# Patient Record
Sex: Male | Born: 1940 | Race: White | Hispanic: No | Smoking: Former smoker
Health system: Southern US, Community
[De-identification: ages and names within clinical notes are randomized; demographics above are authoritative.]

## PROBLEM LIST (undated history)

## (undated) DIAGNOSIS — N419 Inflammatory disease of prostate, unspecified: Secondary | ICD-10-CM

## (undated) DIAGNOSIS — E079 Disorder of thyroid, unspecified: Secondary | ICD-10-CM

## (undated) DIAGNOSIS — R7303 Prediabetes: Secondary | ICD-10-CM

## (undated) DIAGNOSIS — E291 Testicular hypofunction: Secondary | ICD-10-CM

## (undated) DIAGNOSIS — I10 Essential (primary) hypertension: Secondary | ICD-10-CM

## (undated) DIAGNOSIS — E785 Hyperlipidemia, unspecified: Secondary | ICD-10-CM

## (undated) DIAGNOSIS — N400 Enlarged prostate without lower urinary tract symptoms: Secondary | ICD-10-CM

## (undated) HISTORY — DX: Prediabetes: R73.03

## (undated) HISTORY — DX: Hyperlipidemia, unspecified: E78.5

## (undated) HISTORY — DX: Testicular hypofunction: E29.1

## (undated) HISTORY — DX: Disorder of thyroid, unspecified: E07.9

## (undated) HISTORY — DX: Essential (primary) hypertension: I10

## (undated) HISTORY — DX: Inflammatory disease of prostate, unspecified: N41.9

## (undated) HISTORY — DX: Benign prostatic hyperplasia without lower urinary tract symptoms: N40.0

## (undated) HISTORY — PX: APPENDECTOMY: SHX54

---

## 2008-04-09 ENCOUNTER — Ambulatory Visit: Payer: Self-pay | Admitting: Gastroenterology

## 2008-04-14 LAB — HM COLONOSCOPY

## 2008-04-24 ENCOUNTER — Ambulatory Visit: Payer: Self-pay | Admitting: Gastroenterology

## 2008-04-24 ENCOUNTER — Encounter: Payer: Self-pay | Admitting: Gastroenterology

## 2008-04-29 ENCOUNTER — Encounter: Payer: Self-pay | Admitting: Gastroenterology

## 2010-02-16 ENCOUNTER — Ambulatory Visit (HOSPITAL_COMMUNITY)
Admission: RE | Admit: 2010-02-16 | Discharge: 2010-02-16 | Payer: Self-pay | Source: Home / Self Care | Attending: Internal Medicine | Admitting: Internal Medicine

## 2011-02-02 ENCOUNTER — Ambulatory Visit (HOSPITAL_COMMUNITY)
Admission: RE | Admit: 2011-02-02 | Discharge: 2011-02-02 | Disposition: A | Discharge status: Home / Self Care | Payer: Medicare Other | Source: Ambulatory Visit | Attending: Internal Medicine | Admitting: Internal Medicine

## 2011-02-02 ENCOUNTER — Other Ambulatory Visit (HOSPITAL_COMMUNITY): Payer: Self-pay | Admitting: Internal Medicine

## 2011-02-02 DIAGNOSIS — I10 Essential (primary) hypertension: Secondary | ICD-10-CM

## 2011-02-02 DIAGNOSIS — Z0000 Encounter for general adult medical examination without abnormal findings: Secondary | ICD-10-CM | POA: Insufficient documentation

## 2011-08-01 ENCOUNTER — Other Ambulatory Visit (HOSPITAL_COMMUNITY): Payer: Self-pay | Admitting: Internal Medicine

## 2011-08-01 ENCOUNTER — Ambulatory Visit (HOSPITAL_COMMUNITY)
Admission: RE | Admit: 2011-08-01 | Discharge: 2011-08-01 | Disposition: A | Discharge status: Home / Self Care | Payer: Medicare Other | Source: Ambulatory Visit | Attending: Internal Medicine | Admitting: Internal Medicine

## 2011-08-01 DIAGNOSIS — M67919 Unspecified disorder of synovium and tendon, unspecified shoulder: Secondary | ICD-10-CM | POA: Insufficient documentation

## 2011-08-01 DIAGNOSIS — M25519 Pain in unspecified shoulder: Secondary | ICD-10-CM

## 2011-08-01 DIAGNOSIS — M719 Bursopathy, unspecified: Secondary | ICD-10-CM | POA: Insufficient documentation

## 2013-01-31 ENCOUNTER — Other Ambulatory Visit: Payer: Self-pay | Admitting: Internal Medicine

## 2013-02-15 ENCOUNTER — Encounter: Payer: Self-pay | Admitting: Internal Medicine

## 2013-02-18 ENCOUNTER — Ambulatory Visit (INDEPENDENT_AMBULATORY_CARE_PROVIDER_SITE_OTHER): Payer: Medicare Other | Admitting: Internal Medicine

## 2013-02-18 ENCOUNTER — Encounter: Payer: Self-pay | Admitting: Internal Medicine

## 2013-02-18 VITALS — BP 126/80 | HR 72 | Temp 99.0°F | Resp 16 | Wt 179.2 lb

## 2013-02-18 DIAGNOSIS — Z79899 Other long term (current) drug therapy: Secondary | ICD-10-CM

## 2013-02-18 DIAGNOSIS — R7309 Other abnormal glucose: Secondary | ICD-10-CM

## 2013-02-18 DIAGNOSIS — M797 Fibromyalgia: Secondary | ICD-10-CM

## 2013-02-18 DIAGNOSIS — E039 Hypothyroidism, unspecified: Secondary | ICD-10-CM

## 2013-02-18 DIAGNOSIS — E782 Mixed hyperlipidemia: Secondary | ICD-10-CM

## 2013-02-18 DIAGNOSIS — E559 Vitamin D deficiency, unspecified: Secondary | ICD-10-CM

## 2013-02-18 DIAGNOSIS — I10 Essential (primary) hypertension: Secondary | ICD-10-CM | POA: Insufficient documentation

## 2013-02-18 DIAGNOSIS — R7303 Prediabetes: Secondary | ICD-10-CM | POA: Insufficient documentation

## 2013-02-18 DIAGNOSIS — M790 Rheumatism, unspecified: Secondary | ICD-10-CM | POA: Insufficient documentation

## 2013-02-18 LAB — CBC WITH DIFFERENTIAL/PLATELET
BASOS ABS: 0 10*3/uL (ref 0.0–0.1)
Basophils Relative: 0 % (ref 0–1)
Eosinophils Absolute: 0.2 10*3/uL (ref 0.0–0.7)
Eosinophils Relative: 4 % (ref 0–5)
HEMATOCRIT: 42.3 % (ref 39.0–52.0)
HEMOGLOBIN: 14.5 g/dL (ref 13.0–17.0)
LYMPHS ABS: 1.4 10*3/uL (ref 0.7–4.0)
LYMPHS PCT: 25 % (ref 12–46)
MCH: 31.8 pg (ref 26.0–34.0)
MCHC: 34.3 g/dL (ref 30.0–36.0)
MCV: 92.8 fL (ref 78.0–100.0)
MONO ABS: 0.8 10*3/uL (ref 0.1–1.0)
MONOS PCT: 14 % — AB (ref 3–12)
NEUTROS ABS: 3.2 10*3/uL (ref 1.7–7.7)
Neutrophils Relative %: 57 % (ref 43–77)
Platelets: 221 10*3/uL (ref 150–400)
RBC: 4.56 MIL/uL (ref 4.22–5.81)
RDW: 13.7 % (ref 11.5–15.5)
WBC: 5.6 10*3/uL (ref 4.0–10.5)

## 2013-02-18 LAB — BASIC METABOLIC PANEL WITH GFR
BUN: 19 mg/dL (ref 6–23)
CO2: 26 mEq/L (ref 19–32)
Calcium: 9.8 mg/dL (ref 8.4–10.5)
Chloride: 101 mEq/L (ref 96–112)
Creat: 0.98 mg/dL (ref 0.50–1.35)
GFR, Est Non African American: 78 mL/min
GLUCOSE: 102 mg/dL — AB (ref 70–99)
POTASSIUM: 4.3 meq/L (ref 3.5–5.3)
Sodium: 138 mEq/L (ref 135–145)

## 2013-02-18 LAB — HEPATIC FUNCTION PANEL
ALT: 21 U/L (ref 0–53)
AST: 32 U/L (ref 0–37)
Albumin: 4.2 g/dL (ref 3.5–5.2)
Alkaline Phosphatase: 69 U/L (ref 39–117)
BILIRUBIN DIRECT: 0.1 mg/dL (ref 0.0–0.3)
BILIRUBIN INDIRECT: 0.4 mg/dL (ref 0.0–0.9)
Total Bilirubin: 0.5 mg/dL (ref 0.3–1.2)
Total Protein: 6.5 g/dL (ref 6.0–8.3)

## 2013-02-18 LAB — LIPID PANEL
Cholesterol: 198 mg/dL (ref 0–200)
HDL: 67 mg/dL (ref >39)
LDL CALC: 96 mg/dL (ref 0–99)
TRIGLYCERIDES: 177 mg/dL — AB (ref <150)
Total CHOL/HDL Ratio: 3 Ratio
VLDL: 35 mg/dL (ref 0–40)

## 2013-02-18 LAB — MAGNESIUM: Magnesium: 2 mg/dL (ref 1.5–2.5)

## 2013-02-18 NOTE — Patient Instructions (Signed)
Hypertension As your heart beats, it forces blood through your arteries. This force is your blood pressure. If the pressure is too high, it is called hypertension (HTN) or high blood pressure. HTN is dangerous because you may have it and not know it. High blood pressure may mean that your heart has to work harder to pump blood. Your arteries may be narrow or stiff. The extra work puts you at risk for heart disease, stroke, and other problems.  Blood pressure consists of two numbers, a higher number over a lower, 110/72, for example. It is stated as "110 over 72." The ideal is below 120 for the top number (systolic) and under 80 for the bottom (diastolic). Write down your blood pressure today. You should pay close attention to your blood pressure if you have certain conditions such as:  Heart failure.  Prior heart attack.  Diabetes  Chronic kidney disease.  Prior stroke.  Multiple risk factors for heart disease. To see if you have HTN, your blood pressure should be measured while you are seated with your arm held at the level of the heart. It should be measured at least twice. A one-time elevated blood pressure reading (especially in the Emergency Department) does not mean that you need treatment. There may be conditions in which the blood pressure is different between your right and left arms. It is important to see your caregiver soon for a recheck. Most people have essential hypertension which means that there is not a specific cause. This type of high blood pressure may be lowered by changing lifestyle factors such as:  Stress.  Smoking.  Lack of exercise.  Excessive weight.  Drug/tobacco/alcohol use.  Eating less salt. Most people do not have symptoms from high blood pressure until it has caused damage to the body. Effective treatment can often prevent, delay or reduce that damage. TREATMENT  When a cause has been identified, treatment for high blood pressure is directed at the  cause. There are a large number of medications to treat HTN. These fall into several categories, and your caregiver will help you select the medicines that are best for you. Medications may have side effects. You should review side effects with your caregiver. If your blood pressure stays high after you have made lifestyle changes or started on medicines,   Your medication(s) may need to be changed.  Other problems may need to be addressed.  Be certain you understand your prescriptions, and know how and when to take your medicine.  Be sure to follow up with your caregiver within the time frame advised (usually within two weeks) to have your blood pressure rechecked and to review your medications.  If you are taking more than one medicine to lower your blood pressure, make sure you know how and at what times they should be taken. Taking two medicines at the same time can result in blood pressure that is too low. SEEK IMMEDIATE MEDICAL CARE IF:  You develop a severe headache, blurred or changing vision, or confusion.  You have unusual weakness or numbness, or a faint feeling.  You have severe chest or abdominal pain, vomiting, or breathing problems. MAKE SURE YOU:   Understand these instructions.  Will watch your condition.  Will get help right away if you are not doing well or get worse. Document Released: 01/31/2005 Document Revised: 04/25/2011 Document Reviewed: 09/21/2007 [employer] Patient Information 2014 [employer].  Diabetes and Exercise Exercising regularly is important. It is not just about losing weight. It   has many health benefits, such as:  Improving your overall fitness, flexibility, and endurance.  Increasing your bone density.  Helping with weight control.  Decreasing your body fat.  Increasing your muscle strength.  Reducing stress and tension.  Improving your overall health. People with diabetes who exercise gain additional benefits because  exercise:  Reduces appetite.  Improves the body's use of blood sugar (glucose).  Helps lower or control blood glucose.  Decreases blood pressure.  Helps control blood lipids (such as cholesterol and triglycerides).  Improves the body's use of the hormone insulin by:  Increasing the body's insulin sensitivity.  Reducing the body's insulin needs.  Decreases the risk for heart disease because exercising:  Lowers cholesterol and triglycerides levels.  Increases the levels of good cholesterol (such as high-density lipoproteins [HDL]) in the body.  Lowers blood glucose levels. YOUR ACTIVITY PLAN  Choose an activity that you enjoy and set realistic goals. Your health care provider or diabetes educator can help you make an activity plan that works for you. You can break activities into 2 or 3 sessions throughout the day. Doing so is as good as one long session. Exercise ideas include:  Taking the dog for a walk.  Taking the stairs instead of the elevator.  Dancing to your favorite song.  Doing your favorite exercise with a friend. RECOMMENDATIONS FOR EXERCISING WITH TYPE 1 OR TYPE 2 DIABETES   Check your blood glucose before exercising. If blood glucose levels are greater than 240 mg/dL, check for urine ketones. Do not exercise if ketones are present.  Avoid injecting insulin into areas of the body that are going to be exercised. For example, avoid injecting insulin into:  The arms when playing tennis.  The legs when jogging.  Keep a record of:  Food intake before and after you exercise.  Expected peak times of insulin action.  Blood glucose levels before and after you exercise.  The type and amount of exercise you have done.  Review your records with your health care provider. Your health care provider will help you to develop guidelines for adjusting food intake and insulin amounts before and after exercising.  If you take insulin or oral hypoglycemic agents, watch  for signs and symptoms of hypoglycemia. They include:  Dizziness.  Shaking.  Sweating.  Chills.  Confusion.  Drink plenty of water while you exercise to prevent dehydration or heat stroke. Body water is lost during exercise and must be replaced.  Talk to your health care provider before starting an exercise program to make sure it is safe for you. Remember, almost any type of activity is better than none. Document Released: 04/23/2003 Document Revised: 10/03/2012 Document Reviewed: 07/10/2012 [employer] Patient Information 2014 [employer].  Cholesterol Cholesterol is a white, waxy, fat-like protein needed by your body in small amounts. The liver makes all the cholesterol you need. It is carried from the liver by the blood through the blood vessels. Deposits (plaque) may build up on blood vessel walls. This makes the arteries narrower and stiffer. Plaque increases the risk for heart attack and stroke. You cannot feel your cholesterol level even if it is very high. The only way to know is by a blood test to check your lipid (fats) levels. Once you know your cholesterol levels, you should keep a record of the test results. Work with your caregiver to to keep your levels in the desired range. WHAT THE RESULTS MEAN:  Total cholesterol is a rough measure of all the cholesterol   in your blood.  LDL is the so-called bad cholesterol. This is the type that deposits cholesterol in the walls of the arteries. You want this level to be low.  HDL is the good cholesterol because it cleans the arteries and carries the LDL away. You want this level to be high.  Triglycerides are fat that the body can either burn for energy or store. High levels are closely linked to heart disease. DESIRED LEVELS:  Total cholesterol below 200.  LDL below 100 for people at risk, below 70 for very high risk.  HDL above 50 is good, above 60 is best.  Triglycerides below 150. HOW TO LOWER YOUR  CHOLESTEROL:  Diet.  Choose fish or white meat chicken and [place], roasted or baked. Limit fatty cuts of red meat, fried foods, and processed meats, such as sausage and lunch meat.  Eat lots of fresh fruits and vegetables. Choose whole grains, beans, pasta, potatoes and cereals.  Use only small amounts of olive, corn or canola oils. Avoid butter, mayonnaise, shortening or palm kernel oils. Avoid foods with trans-fats.  Use skim/nonfat milk and low-fat/nonfat yogurt and cheeses. Avoid whole milk, cream, ice cream, egg yolks and cheeses. Healthy desserts include angel food cake, ginger snaps, animal crackers, hard candy, popsicles, and low-fat/nonfat frozen yogurt. Avoid pastries, cakes, pies and cookies.  Exercise.  A regular program helps decrease LDL and raises HDL.  Helps with weight control.  Do things that increase your activity level like gardening, walking, or taking the stairs.  Medication.  May be prescribed by your caregiver to help lowering cholesterol and the risk for heart disease.  You may need medicine even if your levels are normal if you have several risk factors. HOME CARE INSTRUCTIONS   Follow your diet and exercise programs as suggested by your caregiver.  Take medications as directed.  Have blood work done when your caregiver feels it is necessary. MAKE SURE YOU:   Understand these instructions.  Will watch your condition.  Will get help right away if you are not doing well or get worse. Document Released: 10/26/2000 Document Revised: 04/25/2011 Document Reviewed: 04/18/2007 [facility] Patient Information 2014 [place], [place].  Vitamin D Deficiency Vitamin D is an important vitamin that your body needs. Having too little of it in your body is called a deficiency. A very bad deficiency can make your bones soft and can cause a condition called rickets.  Vitamin D is important to your body for different reasons, such as:   It helps your body absorb 2  minerals called calcium and phosphorus.  It helps make your bones healthy.  It may prevent some diseases, such as diabetes and multiple sclerosis.  It helps your muscles and heart. You can get vitamin D in several ways. It is a natural part of some foods. The vitamin is also added to some dairy products and cereals. Some people take vitamin D supplements. Also, your body makes vitamin D when you are in the sun. It changes the sun's rays into a form of the vitamin that your body can use. CAUSES   Not eating enough foods that contain vitamin D.  Not getting enough sunlight.  Having certain digestive system diseases that make it hard to absorb vitamin D. These diseases include Crohn's disease, chronic pancreatitis, and cystic fibrosis.  Having a surgery in which part of the stomach or small intestine is removed.  Being obese. Fat cells pull vitamin D out of your blood. That means that obese people  may not have enough vitamin D left in their blood and in other body tissues.  Having chronic kidney or liver disease. RISK FACTORS Risk factors are things that make you more likely to develop a vitamin D deficiency. They include:  Being older.  Not being able to get outside very much.  Living in a nursing home.  Having had broken bones.  Having weak or thin bones (osteoporosis).  Having a disease or condition that changes how your body absorbs vitamin D.  Having dark skin.  Some medicines such as seizure medicines or steroids.  Being overweight or obese. SYMPTOMS Mild cases of vitamin D deficiency may not have any symptoms. If you have a very bad case, symptoms may include:  Bone pain.  Muscle pain.  Falling often.  Broken bones caused by a minor injury, due to osteoporosis. DIAGNOSIS A blood test is the best way to tell if you have a vitamin D deficiency. TREATMENT Vitamin D deficiency can be treated in different ways. Treatment for vitamin D deficiency depends on what is  causing it. Options include:  Taking vitamin D supplements.  Taking a calcium supplement. Your caregiver will suggest what dose is best for you. HOME CARE INSTRUCTIONS  Take any supplements that your caregiver prescribes. Follow the directions carefully. Take only the suggested amount.  Have your blood tested 2 months after you start taking supplements.  Eat foods that contain vitamin D. Healthy choices include:  Fortified dairy products, cereals, or juices. Fortified means vitamin D has been added to the food. Check the label on the package to be sure.  Fatty fish like salmon or trout.  Eggs.  Oysters.  Do not use a tanning bed.  Keep your weight at a healthy level. Lose weight if you need to.  Keep all follow-up appointments. Your caregiver will need to perform blood tests to make sure your vitamin D deficiency is going away. SEEK MEDICAL CARE IF:  You have any questions about your treatment.  You continue to have symptoms of vitamin D deficiency.  You have nausea or vomiting.  You are constipated.  You feel confused.  You have severe abdominal or back pain. MAKE SURE YOU:  Understand these instructions.  Will watch your condition.  Will get help right away if you are not doing well or get worse. Document Released: 04/25/2011 Document Revised: 05/28/2012 Document Reviewed: 04/25/2011 [facility] Patient Information 2014 [place].  Fibromyalgia Fibromyalgia is a disorder that is often misunderstood. It is associated with muscular pains and tenderness that comes and goes. It is often associated with fatigue and sleep disturbances. Though it tends to be long-lasting, fibromyalgia is not life-threatening. CAUSES  The exact cause of fibromyalgia is unknown. People with certain gene types are predisposed to developing fibromyalgia and other conditions. Certain factors can play a role as triggers, such as:  Spine disorders.  Arthritis.  Severe injury (trauma)  and other physical stressors.  Emotional stressors. SYMPTOMS   The main symptom is pain and stiffness in the muscles and joints, which can vary over time.  Sleep and fatigue problems. Other related symptoms may include:  Bowel and bladder problems.  Headaches.  Visual problems.  Problems with odors and noises.  Depression or mood changes.  Painful periods (dysmenorrhea).  Dryness of the skin or eyes. DIAGNOSIS  There are no specific tests for diagnosing fibromyalgia. Patients can be diagnosed accurately from the specific symptoms they have. The diagnosis is made by determining that nothing else is causing the  problems. TREATMENT  There is no cure. Management includes medicines and an active, healthy lifestyle. The goal is to enhance physical fitness, decrease pain, and improve sleep. HOME CARE INSTRUCTIONS   Only take over-the-counter or prescription medicines as directed by your caregiver. Sleeping pills, tranquilizers, and pain medicines may make your problems worse.  Low-impact aerobic exercise is very important and advised for treatment. At first, it may seem to make pain worse. Gradually increasing your tolerance will overcome this feeling.  Learning relaxation techniques and how to control stress will help you. Biofeedback, visual imagery, hypnosis, muscle relaxation, yoga, and meditation are all options.  Anti-inflammatory medicines and physical therapy may provide short-term help.  Acupuncture or massage treatments may help.  Take muscle relaxant medicines as suggested by your caregiver.  Avoid stressful situations.  Plan a healthy lifestyle. This includes your diet, sleep, rest, exercise, and friends.  Find and practice a hobby you enjoy.  Join a fibromyalgia support group for interaction, ideas, and sharing advice. This may be helpful. SEEK MEDICAL CARE IF:  You are not having good results or improvement from your treatment. FOR MORE INFORMATION  National  Fibromyalgia Association: www.fmaware.[place]: www.arthritis.org Document Released: 01/31/2005 Document Revised: 04/25/2011 Document Reviewed: 05/13/2009 [facility] Patient Information 2014 [place], [place].

## 2013-02-18 NOTE — Progress Notes (Signed)
Patient ID: Gregory Galvan, male   DOB: [birth year], 73 y.o.   MRN: [id number]   This very nice 73 y.o. SWM presents for 3 month follow up with Hypertension, Hyperlipidemia, Hypothyroidism, Fibromyalgia, Pre-Diabetes and Vitamin D Deficiency.    HTN predates since 1989. BP has been controlled at home. Today's BP: 126/80 mmHg . Patient denies any cardiac type chest pain, palpitations, dyspnea/orthopnea/PND, dizziness, claudication, or dependent edema.   Hyperlipidemia is controlled with diet & meds. Last Cholesterol was  207, Triglycerides were 177, HDL 62 - at goal and LDL 110 - near goal. Patient denies myalgias or other med SE's.    Also, the patient has history of PreDiabetes with A1c 5.5% in Jan 2014 with last A1c of 5.6% in June 2014. Patient denies any symptoms of reactive hypoglycemia, diabetic polys, paresthesias or visual blurring.   Further, Patient has history of Vitamin D Deficiency of 24 in 2008 with last vitamin D of 90 in June this year. Patient supplements vitamin D without any suspected side-effects.  Current Outpatient Prescriptions on File Prior to Visit  Medication Sig Dispense Refill  . Cholecalciferol (VITAMIN D-3) 1000 UNITS CAPS Take 8,000 Units by mouth daily.      [name] levothyroxine (SYNTHROID, LEVOTHROID) 175 MCG tablet Take 175 mcg by mouth daily before breakfast. Takes 1 tab on tues, thur sat and sun and 1/2 tab on mon, wed, and fri      . lisinopril-hydrochlorothiazide (PRINZIDE,ZESTORETIC) 20-25 MG per tablet take 1 tablet once daily  90 tablet  0  . PARoxetine (PAXIL) 10 MG tablet Take 10 mg by mouth daily.       No current facility-administered medications on file prior to visit.     Allergies  Allergen Reactions  . Penicillins     REACTION: rash,swelling  . 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 Sulfonamide Derivatives     REACTION: itching,swelling    PMHx:   Past Medical History  Diagnosis Date  . Hypertension    . Hyperlipidemia   . Thyroid disease   . Hypogonadism, male   . Prostatitis   . BPH (benign prostatic hyperplasia)   . Pre-diabetes     FHx:    Reviewed / unchanged  SHx:    Reviewed / unchanged  Systems Review: Constitutional: Denies fever, chills, wt changes, headaches, insomnia, fatigue, night sweats, change in appetite. Eyes: Denies redness, blurred vision, diplopia, discharge, itchy, watery eyes.  ENT: Denies discharge, congestion, post nasal drip, epistaxis, sore throat, earache, hearing loss, dental pain, tinnitus, vertigo, sinus pain, snoring.  CV: Denies chest pain, palpitations, irregular heartbeat, syncope, dyspnea, diaphoresis, orthopnea, PND, claudication, edema. Respiratory: denies cough, dyspnea, DOE, pleurisy, hoarseness, laryngitis, wheezing.  Gastrointestinal: Denies dysphagia, odynophagia, heartburn, reflux, water brash, abdominal pain or cramps, nausea, vomiting, bloating, diarrhea, constipation, hematemesis, melena, hematochezia,  or hemorrhoids. Genitourinary: Denies dysuria, frequency, urgency, nocturia, hesitancy, discharge, hematuria, flank pain. Musculoskeletal: Denies arthralgias, stiffness, jt. swelling, pain, limp, strain/sprain. Does report some myalgias & am stiffness controlled on his Paxil.. Skin: Denies pruritus, rash, hives, warts, acne, eczema, change in skin lesion(s). Neuro: No weakness, tremor, incoordination, spasms, paresthesia, or pain. Psychiatric: Denies confusion, memory loss, or sensory loss. Endo: Denies change in weight, skin, hair change.  Heme/Lymph: No excessive bleeding, bruising, orenlarged lymph nodes.  BP: 126/80  Pulse: 72  Temp: 99 F (37.2 C)  Resp: 16      On Exam: Appears well nourished - in no distress. Eyes: PERRLA,  EOMs, conjunctiva no swelling or erythema. Sinuses: No frontal/maxillary tenderness ENT/Mouth: EAC's clear, TM's nl w/o erythema, bulging. Nares clear w/o erythema, swelling, exudates. Oropharynx  clear without erythema or exudates. Oral hygiene is good. Tongue normal, non obstructing. Hearing intact.  Neck: Supple. Thyroid nl. Car 2+/2+ without bruits, nodes or JVD. Chest: Respirations nl with BS clear & equal w/o rales, rhonchi, wheezing or stridor.  Cor: Heart sounds normal w/ regular rate and rhythm without sig. murmurs, gallops, clicks, or rubs. Peripheral pulses normal and equal  without edema.  Abdomen: Soft & bowel sounds normal. Non-tender w/o guarding, rebound, hernias, masses, or organomegaly.  Lymphatics: Unremarkable.  Musculoskeletal: Full ROM all peripheral extremities, joint stability, 5/5 strength, and normal gait.  Skin: Warm, dry without exposed rashes, lesions, ecchymosis apparent.  Neuro: Cranial nerves intact, reflexes equal bilaterally. Sensory-motor testing grossly intact. Tendon reflexes grossly intact.  Pysch: Alert & oriented x 3. Insight and judgement nl & appropriate. No ideations.  Assessment and Plan:  1. Hypertension - Continue monitor blood pressure at home. Continue diet/meds same.  2. Hyperlipidemia - Continue diet/meds, exercise,& lifestyle modifications. Continue monitor periodic cholesterol/liver & renal functions   3. Pre-diabetes - Continue diet, exercise, lifestyle modifications. Monitor appropriate labs.  4. Vitamin D Deficiency - Continue supplementation.  5. Fibromyalgia  6. Hypothyroidism  Recommended regular exercise, BP monitoring, weight control, and discussed med and SE's. Recommended labs to assess and monitor clinical status. Further disposition pending results of labs.

## 2013-02-19 ENCOUNTER — Telehealth: Payer: Self-pay | Admitting: *Deleted

## 2013-02-19 LAB — VITAMIN D 25 HYDROXY (VIT D DEFICIENCY, FRACTURES): Vit D, 25-Hydroxy: 90 ng/mL — ABNORMAL HIGH (ref 30–89)

## 2013-02-19 LAB — HEMOGLOBIN A1C
Hgb A1c MFr Bld: 6 % — ABNORMAL HIGH (ref <5.7)
MEAN PLASMA GLUCOSE: 126 mg/dL — AB (ref <117)

## 2013-02-19 LAB — INSULIN, FASTING: Insulin fasting, serum: 7 u[IU]/mL (ref 3–28)

## 2013-02-19 LAB — TSH: TSH: 4.173 u[IU]/mL (ref 0.350–4.500)

## 2013-02-19 NOTE — Telephone Encounter (Signed)
Message copied by Emelda Brothers on Tue Feb 19, 2013 10:15 AM ------      Message from: Unk Pinto      Created: Tue Feb 19, 2013  8:54 AM       Chol 198 great       A1c was 5.4% - now 6.0% higher in early or prediabetic range - avoid sweets/ candy and white stuff      Vit D 90 - great  - all else nl and ok       ------

## 2013-03-15 ENCOUNTER — Encounter: Payer: Self-pay | Admitting: Gastroenterology

## 2013-05-20 ENCOUNTER — Ambulatory Visit: Payer: Self-pay | Admitting: Emergency Medicine

## 2013-05-21 ENCOUNTER — Ambulatory Visit (INDEPENDENT_AMBULATORY_CARE_PROVIDER_SITE_OTHER): Payer: Medicare Other | Admitting: Emergency Medicine

## 2013-05-21 ENCOUNTER — Encounter: Payer: Self-pay | Admitting: Emergency Medicine

## 2013-05-21 VITALS — BP 132/90 | HR 66 | Temp 98.2°F | Resp 16 | Ht 69.0 in | Wt 178.0 lb

## 2013-05-21 DIAGNOSIS — R7309 Other abnormal glucose: Secondary | ICD-10-CM

## 2013-05-21 DIAGNOSIS — E782 Mixed hyperlipidemia: Secondary | ICD-10-CM

## 2013-05-21 DIAGNOSIS — R238 Other skin changes: Secondary | ICD-10-CM

## 2013-05-21 DIAGNOSIS — R239 Unspecified skin changes: Secondary | ICD-10-CM

## 2013-05-21 DIAGNOSIS — J309 Allergic rhinitis, unspecified: Secondary | ICD-10-CM

## 2013-05-21 DIAGNOSIS — I10 Essential (primary) hypertension: Secondary | ICD-10-CM

## 2013-05-21 LAB — CBC WITH DIFFERENTIAL/PLATELET
BASOS ABS: 0 10*3/uL (ref 0.0–0.1)
BASOS PCT: 0 % (ref 0–1)
EOS PCT: 3 % (ref 0–5)
Eosinophils Absolute: 0.2 10*3/uL (ref 0.0–0.7)
HCT: 38.3 % — ABNORMAL LOW (ref 39.0–52.0)
Hemoglobin: 13.5 g/dL (ref 13.0–17.0)
LYMPHS PCT: 38 % (ref 12–46)
Lymphs Abs: 1.9 10*3/uL (ref 0.7–4.0)
MCH: 32.6 pg (ref 26.0–34.0)
MCHC: 35.2 g/dL (ref 30.0–36.0)
MCV: 92.5 fL (ref 78.0–100.0)
Monocytes Absolute: 0.6 10*3/uL (ref 0.1–1.0)
Monocytes Relative: 12 % (ref 3–12)
NEUTROS ABS: 2.4 10*3/uL (ref 1.7–7.7)
Neutrophils Relative %: 47 % (ref 43–77)
PLATELETS: 253 10*3/uL (ref 150–400)
RBC: 4.14 MIL/uL — ABNORMAL LOW (ref 4.22–5.81)
RDW: 13.5 % (ref 11.5–15.5)
WBC: 5 10*3/uL (ref 4.0–10.5)

## 2013-05-21 NOTE — Patient Instructions (Signed)
Allergic Rhinitis Allergic rhinitis is when the mucous membranes in the nose respond to allergens. Allergens are particles in the air that cause your body to have an allergic reaction. This causes you to release allergic antibodies. Through a chain of events, these eventually cause you to release histamine into the blood stream. Although meant to protect the body, it is this release of histamine that causes your discomfort, such as frequent sneezing, congestion, and an itchy, runny nose.  CAUSES  Seasonal allergic rhinitis (hay fever) is caused by pollen allergens that may come from grasses, trees, and weeds. Year-round allergic rhinitis (perennial allergic rhinitis) is caused by allergens such as house dust mites, pet dander, and mold spores.  SYMPTOMS   Nasal stuffiness (congestion).  Itchy, runny nose with sneezing and tearing of the eyes. DIAGNOSIS  Your health care provider can help you determine the allergen or allergens that trigger your symptoms. If you and your health care provider are unable to determine the allergen, skin or blood testing may be used. TREATMENT  Allergic Rhinitis does not have a cure, but it can be controlled by:  Medicines and allergy shots (immunotherapy).  Avoiding the allergen. Hay fever may often be treated with antihistamines in pill or nasal spray forms. Antihistamines block the effects of histamine. There are over-the-counter medicines that may help with nasal congestion and swelling around the eyes. Check with your health care provider before taking or giving this medicine.  If avoiding the allergen or the medicine prescribed do not work, there are many new medicines your health care provider can prescribe. Stronger medicine may be used if initial measures are ineffective. Desensitizing injections can be used if medicine and avoidance does not work. Desensitization is when a patient is given ongoing shots until the body becomes less sensitive to the allergen.  Make sure you follow up with your health care provider if problems continue. HOME CARE INSTRUCTIONS It is not possible to completely avoid allergens, but you can reduce your symptoms by taking steps to limit your exposure to them. It helps to know exactly what you are allergic to so that you can avoid your specific triggers. SEEK MEDICAL CARE IF:   You have a fever.  You develop a cough that does not stop easily (persistent).  You have shortness of breath.  You start wheezing.  Symptoms interfere with normal daily activities. Document Released: 10/26/2000 Document Revised: 11/21/2012 Document Reviewed: 10/08/2012 [facility] Patient Information 2014 [place]. Squamous Cell Carcinoma  Squamous cell carcinoma is the second most common form of skin cancer. It begins in the squamous cells in the outer layer of the skin (epidermis).  CAUSES  Ultraviolet light exposure is the most common cause of squamous cell carcinoma. This may come from sunlight or tanning beds. Squamous cell carcinoma is most common in sun-exposed areas like the face, neck, arms, and hands. However, squamous cell carcinoma can occur anywhere on the body, including the lips, inside the mouth, the legs, sites of long-term (chronic) scarring, and the anus.  Other causes of squamous cell carcinoma can include:  Exposure to arsenic.  Exposure to radiation.  Exposure to toxic tars and oils. RISK FACTORS Factors that increase your risk for squamous cell carcinoma include:  Having fair skin.  Being middle-aged or elderly.  Heavy sun exposure, especially during childhood.  Repeated sunburns.  Use of tanning beds.  A weakened immune system. This includes patients who have received a transplant and patients with human immunodeficiency virus (HIV) or acquired immunodeficency  syndrome (AIDS).  Human papillomavirus infection.  Conditions that cause chronic scarring. This can include burn scars, chronic ulcers, heat  (thermal) injuries, and radiation.  Exposure to psoralen plus ultraviolet A light therapy.  Exposure to chemical carcinogens, such as tar, soot, and arsenic.  Chronic, inflammatory conditions such as lupus, lichen planus, or lichen sclerosus.  Chronic infections, such as infections of the bone (osteomyelitis).  Smoking. SYMPTOMS  Squamous cell carcinoma often starts as small, skin-colored (pink or brown) sandpaper-like growths. These growths are called solar keratoses or actinic keratoses. These growths are often more easily felt than seen.  DIAGNOSIS  Your caregiver may be able to tell what is wrong by doing a physical exam. Often, a tissue sample is also taken. The tissue sample is examined under a microscope.  TREATMENT  The treatment for squamous cell carcinoma depends on the size and location of the tumors, as well as your overall health. Possible treatments include:   Mohs surgery. This is a procedure done by a skin doctor (dermatologist or Mohs surgeon) in his or her office. The cancerous cells are removed layer by layer.  Laser surgery to remove the tumor.  Freezing the tumor with liquid nitrogen (cryosurgery).  Radiation. This may be used for tumors on the face.  Electrodesiccation and curettage. This involves alternately scraping and burning the tumor, using an electric current to control bleeding. If treated soon enough, squamous cell carcinoma rarely spreads to other areas of the body (metastasizes). If left untreated, however, squamous cell carcinoma will destroy the nearby tissues. This can result in the loss of a nose or ear. PREVENTION  Avoid the sun between 10:00 am and 4:00 pm when it is the strongest.  Use a sunscreen or sunblock with sun protection factor 30 or greater.  Apply sunscreen at least 30 minutes before exposure to the sun.  Reapply sunscreen every 2 to 4 hours while you are outside, after swimming, and after excessive sweating.  Always wear  protective hats, clothing, and sunglasses with ultraviolet protection.  Avoid tanning beds. HOME CARE INSTRUCTIONS   Avoid unprotected sun exposure.  Do not smoke.  Follow your caregiver's instructions for self-exams. Look for new growths or changes in your skin.  Keep all follow-up appointments as directed by your caregiver. SEEK MEDICAL CARE IF:   You notice any new growths or changes in your skin.  You have had a squamous cell carcinoma tumor removed and you notice a new growth in the same location. Document Released: 08/07/2002 Document Revised: 04/25/2011 Document Reviewed: 10/25/2010 [facility] Patient Information 2014 [place].

## 2013-05-21 NOTE — Progress Notes (Signed)
Subjective:    Patient ID: Gregory Galvan, male    DOB: 10-21-1940, 73 y.o.   MRN: [id number]  HPI Comments: 73 yo male presents for 3 month F/U for HTN, Cholesterol, Pre-Dm, D. Deficient. He notes BP is better at home. He has been eating about the same. He is walking occasionally for exercises. He occasionally eats large portions. He did decrease sugars AD. He drinks red wine 3-4 glasses each night.   Last labs WBC      5.6   02/18/2013 HGB     14.5   02/18/2013 HCT     42.3   02/18/2013 MCV     92.8   02/18/2013 PLT      221   02/18/2013 CREATININE     0.98   02/18/2013 BUN              19   02/18/2013 NA              138   02/18/2013 K               4.3   02/18/2013 CL              101   02/18/2013 CO2              26   02/18/2013 ALT           21   02/18/2013 AST           32   02/18/2013 ALKPHOS       69   02/18/2013 BILITOT      0.5   02/18/2013 CHOL         198   02/18/2013 HDL           67   02/18/2013 LDLCALC       96   02/18/2013 TRIG         177   02/18/2013 CHOLHDL      3.0   02/18/2013 HGBA1C      6.0   02/18/2013  He has noticed left temporal rash x 3 months with itch redness on/off. He has history of melanoma but does not see dermatologist routinely.   He has mild allergy drainage without any congestion or production of color. He has not tried any OTC.    Hypertension The current episode started more than 1 year ago.      Review of Systems  HENT: Positive for postnasal drip.   Skin: Positive for color change.  All other systems reviewed and are negative.  BP 132/90  Pulse 66  Temp(Src) 98.2 F (36.8 C) (Temporal)  Resp 16  Ht 5\' 9"  (1.753 m)  Wt 178 lb (80.74 kg)  BMI 26.27 kg/m2     Objective:   Physical Exam  Nursing note and vitals reviewed. Constitutional: He is oriented to person, place, and time. He appears well-developed and well-nourished.  HENT:  Head: Normocephalic and atraumatic.  Right Ear: External ear normal.  Left Ear: External ear normal.  Nose: Nose normal.   Mouth/Throat: No oropharyngeal exudate.  Cloudy TM's bilaterally  Eyes: Conjunctivae and EOM are normal.  Neck: Normal range of motion. Neck supple. No JVD present. No thyromegaly present.  Cardiovascular: Normal rate, regular rhythm, normal heart sounds and intact distal pulses.   Pulmonary/Chest: Effort normal and breath sounds normal.  Abdominal: Soft. Bowel sounds are normal. He exhibits no distension and no mass. There is no tenderness. There is no rebound and no guarding.  Musculoskeletal: Normal range of motion. He exhibits no edema and no tenderness.  Lymphadenopathy:    He has no cervical adenopathy.  Neurological: He is alert and oriented to person, place, and time. He has normal reflexes. No cranial nerve deficit. Coordination normal.  Skin: Skin is warm and dry.  Left temple approx 1 cm mild irritation, scaling  Psychiatric: He has a normal mood and affect. His behavior is normal. Judgment and thought content normal.          Assessment & Plan:  1. 1.  3 month F/U for HTN, Cholesterol, Pre-Dm, D. Deficient. Needs healthy diet, cardio QD and obtain healthy weight. Check Labs, Check BP if >130/80 call office  2. 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 Allergic rhinitis- Zyrtec or Allegra OTC, increase H2o, allergy hygiene explained.

## 2013-05-22 LAB — HEMOGLOBIN A1C
Hgb A1c MFr Bld: 5.7 % — ABNORMAL HIGH (ref <5.7)
MEAN PLASMA GLUCOSE: 117 mg/dL — AB (ref <117)

## 2013-05-22 LAB — BASIC METABOLIC PANEL WITH GFR
BUN: 22 mg/dL (ref 6–23)
CHLORIDE: 102 meq/L (ref 96–112)
CO2: 27 mEq/L (ref 19–32)
Calcium: 9.9 mg/dL (ref 8.4–10.5)
Creat: 0.91 mg/dL (ref 0.50–1.35)
GFR, EST NON AFRICAN AMERICAN: 84 mL/min
GFR, Est African American: >89 mL/min
Glucose, Bld: 92 mg/dL (ref 70–99)
POTASSIUM: 4.4 meq/L (ref 3.5–5.3)
SODIUM: 137 meq/L (ref 135–145)

## 2013-05-22 LAB — HEPATIC FUNCTION PANEL
ALK PHOS: 69 U/L (ref 39–117)
ALT: 23 U/L (ref 0–53)
AST: 36 U/L (ref 0–37)
Albumin: 4.1 g/dL (ref 3.5–5.2)
BILIRUBIN DIRECT: 0.1 mg/dL (ref 0.0–0.3)
BILIRUBIN INDIRECT: 0.4 mg/dL (ref 0.2–1.2)
Total Bilirubin: 0.5 mg/dL (ref 0.2–1.2)
Total Protein: 6.1 g/dL (ref 6.0–8.3)

## 2013-05-22 LAB — LIPID PANEL
Cholesterol: 187 mg/dL (ref 0–200)
HDL: 54 mg/dL (ref >39)
LDL CALC: 88 mg/dL (ref 0–99)
Total CHOL/HDL Ratio: 3.5 Ratio
Triglycerides: 226 mg/dL — ABNORMAL HIGH (ref <150)
VLDL: 45 mg/dL — AB (ref 0–40)

## 2013-05-22 LAB — INSULIN, FASTING: INSULIN FASTING, SERUM: 4 u[IU]/mL (ref 3–28)

## 2013-06-06 ENCOUNTER — Other Ambulatory Visit: Payer: Self-pay | Admitting: Internal Medicine

## 2013-08-05 ENCOUNTER — Other Ambulatory Visit: Payer: Self-pay | Admitting: Physician Assistant

## 2013-08-23 ENCOUNTER — Encounter: Payer: Self-pay | Admitting: Internal Medicine

## 2013-09-19 ENCOUNTER — Encounter: Payer: Self-pay | Admitting: Internal Medicine

## 2013-09-19 ENCOUNTER — Ambulatory Visit (INDEPENDENT_AMBULATORY_CARE_PROVIDER_SITE_OTHER): Payer: Medicare Other | Admitting: Internal Medicine

## 2013-09-19 VITALS — BP 124/84 | HR 56 | Temp 98.8°F | Resp 16 | Ht 68.0 in | Wt 175.0 lb

## 2013-09-19 DIAGNOSIS — R220 Localized swelling, mass and lump, head: Secondary | ICD-10-CM

## 2013-09-19 DIAGNOSIS — E782 Mixed hyperlipidemia: Secondary | ICD-10-CM

## 2013-09-19 DIAGNOSIS — R7309 Other abnormal glucose: Secondary | ICD-10-CM

## 2013-09-19 DIAGNOSIS — E559 Vitamin D deficiency, unspecified: Secondary | ICD-10-CM

## 2013-09-19 DIAGNOSIS — I10 Essential (primary) hypertension: Secondary | ICD-10-CM

## 2013-09-19 DIAGNOSIS — Z1212 Encounter for screening for malignant neoplasm of rectum: Secondary | ICD-10-CM

## 2013-09-19 DIAGNOSIS — Z79899 Other long term (current) drug therapy: Secondary | ICD-10-CM

## 2013-09-19 DIAGNOSIS — Z789 Other specified health status: Secondary | ICD-10-CM

## 2013-09-19 DIAGNOSIS — Z1331 Encounter for screening for depression: Secondary | ICD-10-CM

## 2013-09-19 DIAGNOSIS — Z125 Encounter for screening for malignant neoplasm of prostate: Secondary | ICD-10-CM

## 2013-09-19 DIAGNOSIS — Z0000 Encounter for general adult medical examination without abnormal findings: Secondary | ICD-10-CM

## 2013-09-19 LAB — CBC WITH DIFFERENTIAL/PLATELET
BASOS ABS: 0 10*3/uL (ref 0.0–0.1)
BASOS PCT: 0 % (ref 0–1)
Eosinophils Absolute: 0 10*3/uL (ref 0.0–0.7)
Eosinophils Relative: 1 % (ref 0–5)
HEMATOCRIT: 39.2 % (ref 39.0–52.0)
Hemoglobin: 13.9 g/dL (ref 13.0–17.0)
LYMPHS PCT: 29 % (ref 12–46)
Lymphs Abs: 1.4 10*3/uL (ref 0.7–4.0)
MCH: 31.9 pg (ref 26.0–34.0)
MCHC: 35.5 g/dL (ref 30.0–36.0)
MCV: 89.9 fL (ref 78.0–100.0)
MONO ABS: 0.7 10*3/uL (ref 0.1–1.0)
Monocytes Relative: 14 % — ABNORMAL HIGH (ref 3–12)
NEUTROS ABS: 2.7 10*3/uL (ref 1.7–7.7)
NEUTROS PCT: 56 % (ref 43–77)
Platelets: 217 10*3/uL (ref 150–400)
RBC: 4.36 MIL/uL (ref 4.22–5.81)
RDW: 13.7 % (ref 11.5–15.5)
WBC: 4.9 10*3/uL (ref 4.0–10.5)

## 2013-09-19 NOTE — Progress Notes (Signed)
Patient ID: Gregory Galvan, male   DOB: September 05, 1940, 73 y.o.   MRN: [id number]   Annual Screening Comprehensive Examination  This very nice 73 y.o.male presents for complete physical.  Patient has been followed for HTN, Prediabetes, Hyperlipidemia, Hypothyroidism, Fibromyalgia and Vitamin D Deficiency.    Patient's Fibromyalgia predates many years with classic Sx's and seeming has been remission for years and controlled with Paxil and may well have been a manifestation of Vit D Deficiency.   HTN predates since 1989. Patient's BP has been controlled at home.Today's BP: 124/84 mmHg. Patient denies any cardiac symptoms as chest pain, palpitations, shortness of breath, dizziness or ankle swelling.   Patient's hyperlipidemia is controlled with diet and medications. Patient denies myalgias or other medication SE's. Last lipids were 05/21/2013: Cholesterol, Total 187; HDL  54; LDL  88 - at goal and Triglycerides 226 sl elevated.   Patient has prediabetes since Jan 2014 with A1c 5.8% and patient denies reactive hypoglycemic symptoms, visual blurring, diabetic polys or paresthesias. Last A1c was 05/21/2013: Hemoglobin-A1c 5.7*   Finally, patient has history of Vitamin D Deficiency of 24 in 2008 and last vitamin D was 02/18/2013: Vit D, 25-Hydroxy 90.  Medication Sig  . VITAMIN D-3 1000 UNITS Take 8,000 Units by mouth daily.  [name] XALATAN 0.005 % ophth at bedtime.   [name] levothyroxine 175 MCG tablet take 1 and 1/2 tablet by mouth once daily or as directed  . lisinopril-hctz 20-25 MG  take 1 tablet by mouth once daily  . PARoxetine  10 MG tablet Take 10 mg by mouth daily.   Allergies  Allergen Reactions  . Penicillins     REACTION: rash,swelling  . 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 Sulfonamide Derivatives     REACTION: itching,swelling   Past Medical History  Diagnosis Date  . Hypertension   . Hyperlipidemia   . Thyroid disease   . Hypogonadism, male   .  Prostatitis   . BPH (benign prostatic hyperplasia)   . Pre-diabetes    Past Surgical History  Procedure Laterality Date  . Appendectomy     Family History  Problem Relation Age of Onset  . Cancer Mother     breast  . Alzheimer's disease Mother   . Hypertension Father   . Heart disease Father   . Lymphoma Father   . Hypertension Brother   . Heart disease Brother   . Heart disease Brother   . Hypertension Brother   . Diabetes Brother    History   Social History  . Marital Status: Divorced    Spouse Name: N/A    Number of Children: N/A  . Years of Education: N/A   Occupational History  . Owner of [employer]   Social History Main Topics  . Smoking status: Current Some Day Smoker  . Smokeless tobacco: Not on file     Comment: occasional cigar  . Alcohol Use: 10.5 oz/week    21 drink(s) per week  . Drug Use: No  . Sexual Activity: Not on file    ROS Constitutional: Denies fever, chills, weight loss/gain, headaches, insomnia, fatigue, night sweats or change in appetite. Eyes: Denies redness, blurred vision, diplopia, discharge, itchy or watery eyes.  ENT: Denies discharge, congestion, post nasal drip, epistaxis, sore throat, earache, hearing loss, dental pain, Tinnitus, Vertigo, Sinus pain or snoring.  Cardio: Denies chest pain, palpitations, irregular heartbeat, syncope, dyspnea, diaphoresis, orthopnea, PND, claudication or edema Respiratory: denies cough,  dyspnea, DOE, pleurisy, hoarseness, laryngitis or wheezing.  Gastrointestinal: Denies dysphagia, heartburn, reflux, water brash, pain, cramps, nausea, vomiting, bloating, diarrhea, constipation, hematemesis, melena, hematochezia, jaundice or hemorrhoids Genitourinary: Denies dysuria, frequency, urgency, nocturia, hesitancy, discharge, hematuria or flank pain Musculoskeletal: Denies arthralgia, myalgia, stiffness, Jt. Swelling, pain, limp or strain/sprain. Denies Falls. Skin: Denies puritis, rash, hives, warts,  acne, eczema or change in skin lesion Neuro: No weakness, tremor, incoordination, spasms, paresthesia or pain Psychiatric: Denies confusion, memory loss or sensory loss. Denies Depression. Endocrine: Denies change in weight, skin, hair change, nocturia, and paresthesia, diabetic polys, visual blurring or hyper / hypo glycemic episodes.  Heme/Lymph: No excessive bleeding, bruising or enlarged lymph nodes.  Physical Exam  BP 124/84  Pulse 56  Temp(Src) 98.8 F (37.1 C) (Temporal)  Resp 16  Ht 5\' 8"  (1.727 m)  Wt 175 lb (79.379 kg)  BMI 26.61 kg/m2  General Appearance: Well nourished, in no apparent distress. Eyes: PERRLA, EOMs, conjunctiva no swelling or erythema, normal fundi and vessels. Sinuses: No frontal/maxillary tenderness ENT/Mouth: EACs patent / TMs  nl. Nares clear without erythema, swelling, mucoid exudates. Oral hygiene is good. No erythema, swelling, or exudate. Tongue normal, non-obstructing. Tonsils not swollen or erythematous. Hearing normal.  Neck: Supple, thyroid normal. No bruits, nodes or JVD. Respiratory: Respiratory effort normal.  BS equal and clear bilateral without rales, rhonci, wheezing or stridor. Cardio: Heart sounds are normal with regular rate and rhythm and no murmurs, rubs or gallops. Peripheral pulses are normal and equal bilaterally without edema. No aortic or femoral bruits. Chest: symmetric with normal excursions and percussion.  Abdomen: Flat, soft, with bowl sounds. Nontender, no guarding, rebound, hernias, masses, or organomegaly.  Lymphatics: Non tender without lymphadenopathy. There is a fixed non tender mass approx 7/8" x 17"  in the left pre auricular area anterior to the sideburn.. Genitourinary: No hernias.Testes nl. DRE - prostate nl for age - smooth & firm w/o nodules. Musculoskeletal: Full ROM all peripheral extremities, joint stability, 5/5 strength, and normal gait. Skin: Warm and dry without rashes, lesions, cyanosis, clubbing or   ecchymosis.  Neuro: Cranial nerves intact, reflexes equal bilaterally. Normal muscle tone, no cerebellar symptoms. Sensation intact.  Pysch: Awake and oriented X 3with normal affect, insight and judgment appropriate.  Assessment and Plan  1. Annual Screening Examination 2. Hypertension  3. Hyperlipidemia 4. Pre Diabetes 5. Vitamin D Deficiency 6. Fibromyalgia 7. Hypothyroidism  Continue prudent diet as discussed, weight control, BP monitoring, regular exercise, and medications as discussed.  Discussed med effects and SE's. Routine screening labs and tests as requested with regular follow-up as recommended.

## 2013-09-19 NOTE — Patient Instructions (Signed)

## 2013-09-20 LAB — URINALYSIS, MICROSCOPIC ONLY
Bacteria, UA: NONE SEEN
CASTS: NONE SEEN
CRYSTALS: NONE SEEN
Squamous Epithelial / LPF: NONE SEEN

## 2013-09-20 LAB — TSH: TSH: 1.587 u[IU]/mL (ref 0.350–4.500)

## 2013-09-20 LAB — BASIC METABOLIC PANEL WITH GFR
BUN: 22 mg/dL (ref 6–23)
CHLORIDE: 101 meq/L (ref 96–112)
CO2: 25 mEq/L (ref 19–32)
Calcium: 10 mg/dL (ref 8.4–10.5)
Creat: 1.18 mg/dL (ref 0.50–1.35)
GFR, EST AFRICAN AMERICAN: 71 mL/min
GFR, EST NON AFRICAN AMERICAN: 62 mL/min
Glucose, Bld: 100 mg/dL — ABNORMAL HIGH (ref 70–99)
POTASSIUM: 4.1 meq/L (ref 3.5–5.3)
SODIUM: 136 meq/L (ref 135–145)

## 2013-09-20 LAB — HEMOGLOBIN A1C
HEMOGLOBIN A1C: 5.6 % (ref <5.7)
Mean Plasma Glucose: 114 mg/dL (ref <117)

## 2013-09-20 LAB — MAGNESIUM: Magnesium: 1.9 mg/dL (ref 1.5–2.5)

## 2013-09-20 LAB — VITAMIN D 25 HYDROXY (VIT D DEFICIENCY, FRACTURES): VIT D 25 HYDROXY: 103 ng/mL — AB (ref 30–89)

## 2013-09-20 LAB — MICROALBUMIN / CREATININE URINE RATIO
Creatinine, Urine: 74.6 mg/dL
MICROALB UR: 8.2 mg/dL — AB (ref 0.00–1.89)
MICROALB/CREAT RATIO: 109.9 mg/g — AB (ref 0.0–30.0)

## 2013-09-20 LAB — HEPATIC FUNCTION PANEL
ALK PHOS: 74 U/L (ref 39–117)
ALT: 21 U/L (ref 0–53)
AST: 30 U/L (ref 0–37)
Albumin: 4.3 g/dL (ref 3.5–5.2)
BILIRUBIN DIRECT: 0.1 mg/dL (ref 0.0–0.3)
BILIRUBIN INDIRECT: 0.6 mg/dL (ref 0.2–1.2)
BILIRUBIN TOTAL: 0.7 mg/dL (ref 0.2–1.2)
Total Protein: 6.3 g/dL (ref 6.0–8.3)

## 2013-09-20 LAB — LIPID PANEL
CHOLESTEROL: 181 mg/dL (ref 0–200)
HDL: 57 mg/dL (ref >39)
LDL CALC: 77 mg/dL (ref 0–99)
TRIGLYCERIDES: 234 mg/dL — AB (ref <150)
Total CHOL/HDL Ratio: 3.2 Ratio
VLDL: 47 mg/dL — AB (ref 0–40)

## 2013-09-20 LAB — INSULIN, FASTING: Insulin fasting, serum: 13 u[IU]/mL (ref 3–28)

## 2013-09-20 LAB — PSA: PSA: 0.78 ng/mL (ref <=4.00)

## 2013-11-19 ENCOUNTER — Other Ambulatory Visit: Payer: Self-pay | Admitting: Internal Medicine

## 2013-12-03 ENCOUNTER — Encounter: Payer: Self-pay | Admitting: Gastroenterology

## 2013-12-26 ENCOUNTER — Ambulatory Visit (INDEPENDENT_AMBULATORY_CARE_PROVIDER_SITE_OTHER): Payer: Medicare Other | Admitting: Physician Assistant

## 2013-12-26 ENCOUNTER — Encounter: Payer: Self-pay | Admitting: Physician Assistant

## 2013-12-26 VITALS — BP 110/68 | HR 68 | Temp 97.9°F | Resp 16 | Wt 174.0 lb

## 2013-12-26 DIAGNOSIS — Z79899 Other long term (current) drug therapy: Secondary | ICD-10-CM

## 2013-12-26 DIAGNOSIS — R220 Localized swelling, mass and lump, head: Secondary | ICD-10-CM

## 2013-12-26 DIAGNOSIS — I10 Essential (primary) hypertension: Secondary | ICD-10-CM

## 2013-12-26 DIAGNOSIS — Z23 Encounter for immunization: Secondary | ICD-10-CM

## 2013-12-26 DIAGNOSIS — E559 Vitamin D deficiency, unspecified: Secondary | ICD-10-CM

## 2013-12-26 DIAGNOSIS — E782 Mixed hyperlipidemia: Secondary | ICD-10-CM

## 2013-12-26 DIAGNOSIS — E039 Hypothyroidism, unspecified: Secondary | ICD-10-CM

## 2013-12-26 NOTE — Patient Instructions (Signed)
May have parotid gland on the left blocked, suck on sugar free lemon candy and do warm compresses to left cheek.  Will send to ENT What is the TMJ? The temporomandibular (tem-PUH-ro-man-DIB-yoo-ler) joint, or the TMJ, connects the upper and lower jawbones. This joint allows the jaw to open wide and move back and forth when you chew, talk, or yawn.There are also several muscles that help this joint move. There can be muscle tightness and pain in the muscle that can cause several symptoms.  What causes TMJ pain? There are many causes of TMJ pain. Repeated chewing (for example, chewing gum) and clenching your teeth can cause pain in the joint. Some TMJ pain has no obvious cause. What can I do to ease the pain? There are many things you can do to help your pain get better. When you have pain:  Eat soft foods and stay away from chewy foods (for example, taffy) Try to use both sides of your mouth to chew Don't chew gum Don't open your mouth wide (for example, during yawning or singing) Don't bite your cheeks or fingernails Lower your amount of stress and worry Applying a warm, damp washcloth to the joint may help. Over-the-counter pain medicines such as ibuprofen (one brand: Advil) or acetaminophen (one brand: Tylenol) might also help. Do not use these medicines if you are allergic to them or if your doctor told you not to use them. How can I stop the pain from coming back? When your pain is better, you can do these exercises to make your muscles stronger and to keep the pain from coming back:  Resisted mouth opening: Place your thumb or two fingers under your chin and open your mouth slowly, pushing up lightly on your chin with your thumb. Hold for three to six seconds. Close your mouth slowly. Resisted mouth closing: Place your thumbs under your chin and your two index fingers on the ridge between your mouth and the bottom of your chin. Push down lightly on your chin as you close your mouth. Tongue  up: Slowly open and close your mouth while keeping the tongue touching the roof of the mouth. Side-to-side jaw movement: Place an object about one fourth of an inch thick (for example, two tongue depressors) between your front teeth. Slowly move your jaw from side to side. Increase the thickness of the object as the exercise becomes easier Forward jaw movement: Place an object about one fourth of an inch thick between your front teeth and move the bottom jaw forward so that the bottom teeth are in front of the top teeth. Increase the thickness of the object as the exercise becomes easier. These exercises should not be painful. If it hurts to do these exercises, stop doing them and talk to your family doctor.

## 2013-12-26 NOTE — Progress Notes (Signed)
Assessment and Plan:  Hypertension: Continue medication, monitor blood pressure at home. Continue DASH diet.  Reminder to go to the ER if any CP, SOB, nausea, dizziness, severe HA, changes vision/speech, left arm numbness and tingling, and jaw pain. Cholesterol: Continue diet and exercise. Check cholesterol.  Pre-diabetes-Continue diet and exercise. Check A1C Vitamin D Def- check level and continue medications.  Left sided facial pain/swelling per patient- patient thinks sinusitis but no symptoms- will check CBC, ESR, and refer to ENT if the patient wants later ? Parotid gland- will do warm compresses/sugar free lemon candy, and will do massage/soft foods for possible TMJ.   Continue diet and meds as discussed. Further disposition pending results of labs.  HPI 73 y.o. male  presents for 3 month follow up with hypertension, hyperlipidemia, prediabetes and vitamin D. His blood pressure has been controlled at home, today their BP is BP: 110/68 mmHg He does not workout. He denies chest pain, shortness of breath, dizziness.  He is not on cholesterol medication and denies myalgias. His cholesterol is at goal. The cholesterol last visit was:   Lab Results  Component Value Date   CHOL 181 09/19/2013   HDL 57 09/19/2013   LDLCALC 77 09/19/2013   TRIG 234* 09/19/2013   CHOLHDL 3.2 09/19/2013   He has been working on diet and exercise for prediabetes, and denies paresthesia of the feet, polydipsia and polyuria. Last A1C in the office was:  Lab Results  Component Value Date   HGBA1C 5.6 09/19/2013   Patient is on Vitamin D supplement.   Lab Results  Component Value Date   VD25OH 103* 09/19/2013     Left sided pain, swelling, and he has had sinus infection and is requesting antibiotic, however he denies fever, chills, no post nasal drip. No changes in vision, speech, + dry mouth.   He is on thyroid medication. His medication was not changed last visit. P Lab Results  Component Value Date   TSH  1.587 09/19/2013  .    Current Medications:  Current Outpatient Prescriptions on File Prior to Visit  Medication Sig Dispense Refill  . Cholecalciferol (VITAMIN D-3) 1000 UNITS CAPS Take 8,000 Units by mouth daily.    [name] LABETALOL HCL PO Take 20 mg by mouth daily. Takes 1/2 tab BID    . latanoprost (XALATAN) 0.005 % ophthalmic solution at bedtime.     [name] levothyroxine (SYNTHROID, LEVOTHROID) 175 MCG tablet take 1 and 1/2 tablet by mouth on M,W and F and 1 tab the other 4 days    . lisinopril-hydrochlorothiazide (PRINZIDE,ZESTORETIC) 20-25 MG per tablet take 1 tablet by mouth once daily 90 tablet 0  . PARoxetine (PAXIL) 20 MG tablet take 1 tablet by mouth once daily 90 tablet 99   No current facility-administered medications on file prior to visit.   Medical History:  Past Medical History  Diagnosis Date  . Hypertension   . Hyperlipidemia   . Thyroid disease   . Hypogonadism, male   . Prostatitis   . BPH (benign prostatic hyperplasia)   . Pre-diabetes    Allergies:  Allergies  Allergen Reactions  . Penicillins     REACTION: rash,swelling  . 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 Sulfonamide Derivatives     REACTION: itching,swelling     Review of Systems: $RemoveBef'[X]'gqluZxIfQQ$  = complains of  $R'[ ]'UC$  = denies  General: Fatigue $RemoveBeforeDE'[ ]'XxvRbvPIImbnTAB$  Fever $Remo'[ ]'YLfPT$  Chills $Remov'[ ]'tqwvlo$  Weakness $RemoveB'[ ]'hSDECXUi$   Insomnia $RemoveB'[ ]'DAFwlTJe$   Eyes: Redness [ ]  Blurred vision [ ]  Diplopia [ ]   ENT: Congestion [ ]  Sinus Pain [ ]  Post Nasal Drip [ ]  Sore Throat [ ]  Earache [ ]   Cardiac: Chest pain/pressure [ ]  SOB [ ]  Orthopnea [ ]   Palpitations [ ]   Paroxysmal nocturnal dyspnea[ ]  Claudication [ ]  Edema [ ]   Pulmonary: Cough [ ]  Wheezing[ ]   SOB [ ]   Snoring [ ]   GI: Nausea [ ]  Vomiting[ ]  Dysphagia[ ]  Heartburn[ ]  Abdominal pain [ ]  Constipation [ ] ; Diarrhea [ ] ; BRBPR [ ]  Melena[ ]  GU: Hematuria[ ]  Dysuria [ ]  Nocturia[ ]  Urgency [ ]   Hesitancy [ ]  Discharge [ ]  Neuro: Headaches[ ]  Vertigo[ ]  Paresthesias[ ]  Spasm [ ]   Speech changes [ ]  Incoordination [ ]   Ortho: Arthritis [ ]  Joint pain [ ]  Muscle pain [ ]  Joint swelling [ ]  Back Pain [ ]  Skin:  Rash [ ]   Pruritis [ ]  Change in skin lesion [ ]   Psych: Depression[ ]  Anxiety[ ]  Confusion [ ]  Memory loss [ ]   Heme/Lypmh: Bleeding [ ]  Bruising [ ]  Enlarged lymph nodes [ ]   Endocrine: Visual blurring [ ]  Paresthesia [ ]  Polyuria [ ]  Polydypsea [ ]    Heat/cold intolerance [ ]  Hypoglycemia [ ]   Family history- Review and unchanged Social history- Review and unchanged Physical Exam: BP 110/68 mmHg  Pulse 68  Temp(Src) 97.9 F (36.6 C)  Resp 16  Wt 174 lb (78.926 kg) Wt Readings from Last 3 Encounters:  12/26/13 174 lb (78.926 kg)  09/19/13 175 lb (79.379 kg)  05/21/13 178 lb (80.74 kg)   General Appearance: Well nourished, in no apparent distress. Eyes: PERRLA, EOMs, conjunctiva no swelling or erythema Sinuses: No Frontal/maxillary tenderness ENT/Mouth: Ext aud canals clear, TMs without erythema, bulging. No erythema, swelling, or exudate on post pharynx.  Tonsils not swollen or erythematous. Hearing normal. Questionable left sided swelling anterior to left ear, non tender, + TMJ tenderness bilateral.  Neck: Supple, thyroid normal.  Respiratory: Respiratory effort normal, BS equal bilaterally without rales, rhonchi, wheezing or stridor.  Cardio: RRR with no MRGs. Brisk peripheral pulses without edema.  Abdomen: Soft, + BS.  Non tender, no guarding, rebound, hernias, masses. Lymphatics: Non tender without lymphadenopathy.  Musculoskeletal: Full ROM, 5/5 strength, normal gait.  Skin: Warm, dry without rashes, lesions, ecchymosis.  Neuro: Cranial nerves intact. Normal muscle tone, no cerebellar symptoms.  Psych: Awake and oriented X 3, normal affect, Insight and Judgment appropriate.    Vicie Mutters, PA-C 3:20 PM [facility]

## 2013-12-27 LAB — LIPID PANEL
CHOL/HDL RATIO: 2.8 ratio
CHOLESTEROL: 199 mg/dL (ref 0–200)
HDL: 71 mg/dL (ref >39)
LDL Cholesterol: 82 mg/dL (ref 0–99)
TRIGLYCERIDES: 231 mg/dL — AB (ref <150)
VLDL: 46 mg/dL — AB (ref 0–40)

## 2013-12-27 LAB — CBC WITH DIFFERENTIAL/PLATELET
Basophils Absolute: 0 10*3/uL (ref 0.0–0.1)
Basophils Relative: 0 % (ref 0–1)
EOS ABS: 0.1 10*3/uL (ref 0.0–0.7)
Eosinophils Relative: 3 % (ref 0–5)
HCT: 40.7 % (ref 39.0–52.0)
Hemoglobin: 13.8 g/dL (ref 13.0–17.0)
Lymphocytes Relative: 34 % (ref 12–46)
Lymphs Abs: 1.6 10*3/uL (ref 0.7–4.0)
MCH: 31.4 pg (ref 26.0–34.0)
MCHC: 33.9 g/dL (ref 30.0–36.0)
MCV: 92.5 fL (ref 78.0–100.0)
Monocytes Absolute: 0.7 10*3/uL (ref 0.1–1.0)
Monocytes Relative: 15 % — ABNORMAL HIGH (ref 3–12)
NEUTROS PCT: 48 % (ref 43–77)
Neutro Abs: 2.3 10*3/uL (ref 1.7–7.7)
Platelets: 272 10*3/uL (ref 150–400)
RBC: 4.4 MIL/uL (ref 4.22–5.81)
RDW: 13.6 % (ref 11.5–15.5)
WBC: 4.8 10*3/uL (ref 4.0–10.5)

## 2013-12-27 LAB — BASIC METABOLIC PANEL WITH GFR
BUN: 16 mg/dL (ref 6–23)
CALCIUM: 9.9 mg/dL (ref 8.4–10.5)
CO2: 27 meq/L (ref 19–32)
Chloride: 101 mEq/L (ref 96–112)
Creat: 0.82 mg/dL (ref 0.50–1.35)
GFR, Est Non African American: 89 mL/min
Glucose, Bld: 99 mg/dL (ref 70–99)
Potassium: 4.7 mEq/L (ref 3.5–5.3)
Sodium: 137 mEq/L (ref 135–145)

## 2013-12-27 LAB — MAGNESIUM: Magnesium: 2.1 mg/dL (ref 1.5–2.5)

## 2013-12-27 LAB — HEPATIC FUNCTION PANEL
ALT: 22 U/L (ref 0–53)
AST: 37 U/L (ref 0–37)
Albumin: 4.1 g/dL (ref 3.5–5.2)
Alkaline Phosphatase: 78 U/L (ref 39–117)
BILIRUBIN TOTAL: 0.7 mg/dL (ref 0.2–1.2)
Bilirubin, Direct: 0.1 mg/dL (ref 0.0–0.3)
Indirect Bilirubin: 0.6 mg/dL (ref 0.2–1.2)
Total Protein: 6.3 g/dL (ref 6.0–8.3)

## 2013-12-27 LAB — VITAMIN D 25 HYDROXY (VIT D DEFICIENCY, FRACTURES): Vit D, 25-Hydroxy: 83 ng/mL (ref 30–89)

## 2013-12-27 LAB — TSH: TSH: 3.006 u[IU]/mL (ref 0.350–4.500)

## 2013-12-27 LAB — SEDIMENTATION RATE: SED RATE: 4 mm/h (ref 0–16)

## 2014-01-10 ENCOUNTER — Other Ambulatory Visit: Payer: Self-pay | Admitting: Internal Medicine

## 2014-01-10 MED ORDER — LABETALOL HCL 200 MG PO TABS: ORAL_TABLET | ORAL | Status: DC

## 2014-02-02 ENCOUNTER — Other Ambulatory Visit: Payer: Self-pay | Admitting: Internal Medicine

## 2014-03-19 ENCOUNTER — Other Ambulatory Visit: Payer: Self-pay | Admitting: *Deleted

## 2014-03-19 MED ORDER — MELOXICAM 15 MG PO TABS: ORAL_TABLET | ORAL | Status: DC

## 2014-04-08 ENCOUNTER — Ambulatory Visit (INDEPENDENT_AMBULATORY_CARE_PROVIDER_SITE_OTHER): Payer: Medicare Other | Admitting: Internal Medicine

## 2014-04-08 ENCOUNTER — Encounter: Payer: Self-pay | Admitting: Internal Medicine

## 2014-04-08 VITALS — BP 104/66 | HR 60 | Temp 98.2°F | Resp 16 | Ht 68.0 in | Wt 172.4 lb

## 2014-04-08 DIAGNOSIS — M797 Fibromyalgia: Secondary | ICD-10-CM

## 2014-04-08 DIAGNOSIS — Z79899 Other long term (current) drug therapy: Secondary | ICD-10-CM

## 2014-04-08 DIAGNOSIS — E782 Mixed hyperlipidemia: Secondary | ICD-10-CM

## 2014-04-08 DIAGNOSIS — R7303 Prediabetes: Secondary | ICD-10-CM

## 2014-04-08 DIAGNOSIS — E039 Hypothyroidism, unspecified: Secondary | ICD-10-CM

## 2014-04-08 DIAGNOSIS — E559 Vitamin D deficiency, unspecified: Secondary | ICD-10-CM

## 2014-04-08 DIAGNOSIS — I10 Essential (primary) hypertension: Secondary | ICD-10-CM

## 2014-04-08 DIAGNOSIS — M790 Rheumatism, unspecified: Secondary | ICD-10-CM

## 2014-04-08 LAB — CBC WITH DIFFERENTIAL/PLATELET
Basophils Absolute: 0 10*3/uL (ref 0.0–0.1)
Basophils Relative: 1 % (ref 0–1)
Eosinophils Absolute: 0.2 10*3/uL (ref 0.0–0.7)
Eosinophils Relative: 4 % (ref 0–5)
HEMATOCRIT: 41.5 % (ref 39.0–52.0)
HEMOGLOBIN: 14.2 g/dL (ref 13.0–17.0)
LYMPHS PCT: 31 % (ref 12–46)
Lymphs Abs: 1.2 10*3/uL (ref 0.7–4.0)
MCH: 31.6 pg (ref 26.0–34.0)
MCHC: 34.2 g/dL (ref 30.0–36.0)
MCV: 92.4 fL (ref 78.0–100.0)
MONOS PCT: 13 % — AB (ref 3–12)
MPV: 10.7 fL (ref 8.6–12.4)
Monocytes Absolute: 0.5 10*3/uL (ref 0.1–1.0)
NEUTROS ABS: 1.9 10*3/uL (ref 1.7–7.7)
NEUTROS PCT: 51 % (ref 43–77)
Platelets: 229 10*3/uL (ref 150–400)
RBC: 4.49 MIL/uL (ref 4.22–5.81)
RDW: 13.5 % (ref 11.5–15.5)
WBC: 3.8 10*3/uL — ABNORMAL LOW (ref 4.0–10.5)

## 2014-04-08 LAB — HEMOGLOBIN A1C
Hgb A1c MFr Bld: 5.9 % — ABNORMAL HIGH (ref <5.7)
Mean Plasma Glucose: 123 mg/dL — ABNORMAL HIGH (ref <117)

## 2014-04-08 NOTE — Progress Notes (Signed)
Patient ID: Gregory Galvan, male   DOB: October 27, 1940, 74 y.o.   MRN: [id number]   This very nice 74 y.o. SWM presents for 3 month follow up with Hypertension, Hyperlipidemia, Pre-Diabetes and Vitamin D Deficiency.    Patient is treated for HTN & BP has been controlled at home. Today's BP: 104/66 mmHg. Patient has had no complaints of any cardiac type chest pain, palpitations, dyspnea/orthopnea/PND, dizziness, claudication, or dependent edema.   Hyperlipidemia is controlled with diet & meds. Patient denies myalgias or other med SE's. Last Lipids were Total Chol 199; HDL  71; LDL  82; Trig 231 on 12/26/2013.   Also, the patient has history of  PreDiabetes and has had no symptoms of reactive hypoglycemia, diabetic polys, paresthesias or visual blurring.  Last A1c was  5.6% on  09/19/2013.   Further, the patient also has history of Vitamin D Deficiency and supplements vitamin D without any suspected side-effects. Last vitamin D was 83 on 12/26/2013.  Medication Sig  . VITAMIN D Take 8,000 Units  daily.  [name] labetalol  200 MG tablet Take 1/2-1 tab 2 x day P  . latanoprost (XALATAN) 0.005 % opht soln at bedtime.   [name] levothyroxine  175 MCG  take 1 & 1/2 tab on M,W and F and 1 tab x 4 days  . lisinopril-hctz  20-25 MG  take 1 tablet by mouth once daily  . meloxicam  15 MG  Take 1/2 to 1 tab daily with food for pain and inflammation.  [name] PARoxetine  20 MG  take 1 tablet by mouth once daily   Allergies  Allergen Reactions  . Penicillins     REACTION: rash,swelling  . 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 Sulfonamide Derivatives     REACTION: itching,swelling   PMHx:   Past Medical History  Diagnosis Date  . Hypertension   . Hyperlipidemia   . Thyroid disease   . Hypogonadism, male   . Prostatitis   . BPH (benign prostatic hyperplasia)   . Pre-diabetes    Immunization History  Administered Date(s) Administered  . DTaP 01/15/2004  . Influenza, High  Dose Seasonal PF 12/26/2013  . Pneumococcal Polysaccharide-23 02/26/2007   Past Surgical History  Procedure Laterality Date  . Appendectomy     FHx:    Reviewed / unchanged  SHx:    Reviewed / unchanged  Systems Review:  Constitutional: Denies fever, chills, wt changes, headaches, insomnia, fatigue, night sweats, change in appetite. Eyes: Denies redness, blurred vision, diplopia, discharge, itchy, watery eyes.  ENT: Denies discharge, congestion, post nasal drip, epistaxis, sore throat, earache, hearing loss, dental pain, tinnitus, vertigo, sinus pain, snoring.  CV: Denies chest pain, palpitations, irregular heartbeat, syncope, dyspnea, diaphoresis, orthopnea, PND, claudication or edema. Respiratory: denies cough, dyspnea, DOE, pleurisy, hoarseness, laryngitis, wheezing.  Gastrointestinal: Denies dysphagia, odynophagia, heartburn, reflux, water brash, abdominal pain or cramps, nausea, vomiting, bloating, diarrhea, constipation, hematemesis, melena, hematochezia  or hemorrhoids. Genitourinary: Denies dysuria, frequency, urgency, nocturia, hesitancy, discharge, hematuria or flank pain. Musculoskeletal: Denies arthralgias, myalgias, stiffness, jt. swelling, pain, limping or strain/sprain.  Skin: Denies pruritus, rash, hives, warts, acne, eczema or change in skin lesion(s). Neuro: No weakness, tremor, incoordination, spasms, paresthesia or pain. Psychiatric: Denies confusion, memory loss or sensory loss. Endo: Denies change in weight, skin or hair change.  Heme/Lymph: No excessive bleeding, bruising or enlarged lymph nodes.  Physical Exam  BP 104/66 mmHg  Pulse 60  Temp(Src) 98.2 F (36.8 C)  Resp 16  Ht 5\' 8"  (1.727 m)  Wt 172 lb 6.4 oz (78.2 kg)  BMI 26.22 kg/m2  Appears well nourished and in no distress. Eyes: PERRLA, EOMs, conjunctiva no swelling or erythema. Sinuses: No frontal/maxillary tenderness ENT/Mouth: EAC's clear, TM's nl w/o erythema, bulging. Nares clear w/o  erythema, swelling, exudates. Oropharynx clear without erythema or exudates. Oral hygiene is good. Tongue normal, non obstructing. Hearing intact.  Neck: Supple. Thyroid nl. Car 2+/2+ without bruits, nodes or JVD. Chest: Respirations nl with BS clear & equal w/o rales, rhonchi, wheezing or stridor.  Cor: Heart sounds normal w/ regular rate and rhythm without sig. murmurs, gallops, clicks, or rubs. Peripheral pulses normal and equal  without edema.  Abdomen: Soft & bowel sounds normal. Non-tender w/o guarding, rebound, hernias, masses, or organomegaly.  Lymphatics: Unremarkable.  Musculoskeletal: Full ROM all peripheral extremities, joint stability, 5/5 strength, and normal gait.  Skin: Warm, dry without exposed rashes, lesions or ecchymosis apparent.  Neuro: Cranial nerves intact, reflexes equal bilaterally. Sensory-motor testing grossly intact. Tendon reflexes grossly intact.  Pysch: Alert & oriented x 3.  Insight and judgement nl & appropriate. No ideations.  Assessment and Plan:  1. Essential hypertension  - TSH  2. Hyperlipidemia  - Lipid panel  3. Hypothyroidism, unspecified hypothyroidism type   4. Fibromyalgia -   5. Prediabetes -  - Hemoglobin A1c - Insulin, fasting  6. Vitamin D deficiency -  - Vit D  25 hydroxy (rtn osteoporosis monitoring)  7. Medication management -  - CBC with Differential/Platelet - BASIC METABOLIC PANEL WITH GFR - Hepatic function panel - Magnesium    Recommended regular exercise, BP monitoring, weight control, and discussed med and SE's. Recommended labs to assess and monitor clinical status. Further disposition pending results of labs.

## 2014-04-08 NOTE — Patient Instructions (Signed)

## 2014-04-09 LAB — HEPATIC FUNCTION PANEL
ALBUMIN: 4 g/dL (ref 3.5–5.2)
ALT: 17 U/L (ref 0–53)
AST: 24 U/L (ref 0–37)
Alkaline Phosphatase: 76 U/L (ref 39–117)
BILIRUBIN TOTAL: 0.5 mg/dL (ref 0.2–1.2)
Bilirubin, Direct: 0.1 mg/dL (ref 0.0–0.3)
Indirect Bilirubin: 0.4 mg/dL (ref 0.2–1.2)
Total Protein: 6.2 g/dL (ref 6.0–8.3)

## 2014-04-09 LAB — VITAMIN D 25 HYDROXY (VIT D DEFICIENCY, FRACTURES): VIT D 25 HYDROXY: 88 ng/mL (ref 30–100)

## 2014-04-09 LAB — BASIC METABOLIC PANEL WITH GFR
BUN: 19 mg/dL (ref 6–23)
CALCIUM: 9.9 mg/dL (ref 8.4–10.5)
CHLORIDE: 103 meq/L (ref 96–112)
CO2: 28 mEq/L (ref 19–32)
Creat: 0.81 mg/dL (ref 0.50–1.35)
GFR, Est African American: >89 mL/min
GFR, Est Non African American: 89 mL/min
Glucose, Bld: 89 mg/dL (ref 70–99)
Potassium: 4.4 mEq/L (ref 3.5–5.3)
SODIUM: 139 meq/L (ref 135–145)

## 2014-04-09 LAB — LIPID PANEL
Cholesterol: 197 mg/dL (ref 0–200)
HDL: 69 mg/dL (ref >=40)
LDL Cholesterol: 105 mg/dL — ABNORMAL HIGH (ref 0–99)
Total CHOL/HDL Ratio: 2.9 Ratio
Triglycerides: 114 mg/dL (ref <150)
VLDL: 23 mg/dL (ref 0–40)

## 2014-04-09 LAB — TSH: TSH: 1.919 u[IU]/mL (ref 0.350–4.500)

## 2014-04-09 LAB — INSULIN, FASTING: Insulin fasting, serum: 46.2 u[IU]/mL — ABNORMAL HIGH (ref 2.0–19.6)

## 2014-04-09 LAB — MAGNESIUM: MAGNESIUM: 1.9 mg/dL (ref 1.5–2.5)

## 2014-07-23 ENCOUNTER — Ambulatory Visit (INDEPENDENT_AMBULATORY_CARE_PROVIDER_SITE_OTHER): Payer: Medicare Other | Admitting: Physician Assistant

## 2014-07-23 ENCOUNTER — Encounter: Payer: Self-pay | Admitting: Physician Assistant

## 2014-07-23 VITALS — BP 118/74 | HR 64 | Temp 98.6°F | Resp 16 | Ht 68.0 in | Wt 163.0 lb

## 2014-07-23 DIAGNOSIS — M790 Rheumatism, unspecified: Secondary | ICD-10-CM

## 2014-07-23 DIAGNOSIS — E559 Vitamin D deficiency, unspecified: Secondary | ICD-10-CM

## 2014-07-23 DIAGNOSIS — E782 Mixed hyperlipidemia: Secondary | ICD-10-CM

## 2014-07-23 DIAGNOSIS — M797 Fibromyalgia: Secondary | ICD-10-CM

## 2014-07-23 DIAGNOSIS — Z0000 Encounter for general adult medical examination without abnormal findings: Secondary | ICD-10-CM

## 2014-07-23 DIAGNOSIS — Z1331 Encounter for screening for depression: Secondary | ICD-10-CM

## 2014-07-23 DIAGNOSIS — Z0001 Encounter for general adult medical examination with abnormal findings: Secondary | ICD-10-CM

## 2014-07-23 DIAGNOSIS — Z9181 History of falling: Secondary | ICD-10-CM

## 2014-07-23 DIAGNOSIS — F325 Major depressive disorder, single episode, in full remission: Secondary | ICD-10-CM | POA: Insufficient documentation

## 2014-07-23 DIAGNOSIS — R7309 Other abnormal glucose: Secondary | ICD-10-CM

## 2014-07-23 DIAGNOSIS — H409 Unspecified glaucoma: Secondary | ICD-10-CM | POA: Insufficient documentation

## 2014-07-23 DIAGNOSIS — I10 Essential (primary) hypertension: Secondary | ICD-10-CM

## 2014-07-23 DIAGNOSIS — E039 Hypothyroidism, unspecified: Secondary | ICD-10-CM

## 2014-07-23 DIAGNOSIS — R6889 Other general symptoms and signs: Secondary | ICD-10-CM

## 2014-07-23 DIAGNOSIS — Z79899 Other long term (current) drug therapy: Secondary | ICD-10-CM

## 2014-07-23 DIAGNOSIS — R7303 Prediabetes: Secondary | ICD-10-CM

## 2014-07-23 NOTE — Progress Notes (Signed)
MEDICARE ANNUAL WELLNESS VISIT AND FOLLOW UP Assessment:   1. Essential hypertension - continue medications, DASH diet, exercise and monitor at home. Call if greater than 130/80.  - CBC with Differential/Platelet - BASIC METABOLIC PANEL WITH GFR - Hepatic function panel  2. Prediabetes Discussed general issues about diabetes pathophysiology and management., Educational material distributed., Suggested low cholesterol diet., Encouraged aerobic exercise., Discussed foot care., Reminded to get yearly retinal exam. -Cut back on wine/stop - Hemoglobin A1c - Insulin, fasting - HM DIABETES FOOT EXAM  3. Hyperlipidemia -continue medications, check lipids, decrease fatty foods, increase activity.  - Lipid panel  4. Hypothyroidism, unspecified hypothyroidism type Hypothyroidism-check TSH level, continue medications the same, reminded to take on an empty stomach 30-58mins before food.  - TSH  5. Medication management - Magnesium  6. Vitamin D deficiency - Vit D  25 hydroxy (rtn osteoporosis monitoring)  7. Fibromyalgia Continue paxil and meloxicam  8. Routine general medical examination at a health care facility Schedule for colonoscopy, needs TD and prevnar NEXT OV, out of in the office.   9. 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 Glaucoma (increased eye pressure) Continue follow up Dr. Delman Cheadle  Over 30 minutes of exam, counseling, chart review, and critical decision making was performed  Plan:   During the course of the visit the patient was educated and counseled about appropriate screening and preventive services including:    Pneumococcal vaccine   Influenza vaccine  Td vaccine  Screening electrocardiogram  Colorectal cancer screening  Diabetes screening  Glaucoma screening  Nutrition counseling   Conditions/risks identified: BMI: Discussed weight loss, diet, and increase physical activity.  Increase  physical activity: AHA recommends 150 minutes of physical activity a week.  Medications reviewed Diabetes is at goal, ACE/ARB therapy: Yes. Urinary Incontinence is not an issue: discussed non pharmacology and pharmacology options.  Fall risk: low- discussed PT, home fall assessment, medications.    Subjective:  Gregory Galvan is a 74 y.o. male who presents for Medicare Annual Wellness Visit and 3 month follow up for HTN, hyperlipidemia, prediabetes, and vitamin D Def.  Date of last medicare wellness visit was is unknown.  His blood pressure has been controlled at home, today their BP is BP: 118/74 mmHg He does not workout regular but occ walks. He denies chest pain, shortness of breath, dizziness.  He is not on cholesterol medication and denies myalgias. His cholesterol is at goal. The cholesterol last visit was:   Lab Results  Component Value Date   CHOL 197 04/08/2014   HDL 69 04/08/2014   LDLCALC 105* 04/08/2014   TRIG 114 04/08/2014   CHOLHDL 2.9 04/08/2014   He has been working on diet and exercise for prediabetes, and denies paresthesia of the feet, polydipsia, polyuria and visual disturbances. Last A1C in the office was:  Lab Results  Component Value Date   HGBA1C 5.9* 04/08/2014   He is on thyroid medication. His medication was not changed last visit.   Lab Results  Component Value Date   TSH 1.919 04/08/2014   He has a history of depression/FM and is on paxil. He has lower back pain, and complains of left arm tingling in the morning. Denies weakness, pain down his legs/arms.  Patient is on Vitamin D supplement.   Lab Results  Component Value Date   VD25OH 88 04/08/2014      Medication Review: Current Outpatient Prescriptions on File Prior to Visit  Medication Sig Dispense  Refill  . Cholecalciferol (VITAMIN D-3) 1000 UNITS CAPS Take 8,000 Units by mouth daily.    [name] labetalol (NORMODYNE) 200 MG tablet Take 1/2 to 1 tablet 2 x day as recommended for BP 90 tablet 99   . latanoprost (XALATAN) 0.005 % ophthalmic solution at bedtime.     [name] levothyroxine (SYNTHROID, LEVOTHROID) 175 MCG tablet take 1 and 1/2 tablet by mouth on M,W and F and 1 tab the other 4 days    . lisinopril-hydrochlorothiazide (PRINZIDE,ZESTORETIC) 20-25 MG per tablet take 1 tablet by mouth once daily 90 tablet 0  . meloxicam (MOBIC) 15 MG tablet Take 1/2 to 1 tab daily with food for pain and inflammation. 90 tablet 0  . PARoxetine (PAXIL) 20 MG tablet take 1 tablet by mouth once daily 90 tablet 99   No current facility-administered medications on file prior to visit.    Current Problems (verified) Patient Active Problem List   Diagnosis Date Noted  . Depression, major, in remission 07/23/2014  . Glaucoma (increased eye pressure) 07/23/2014  . Medication management 09/19/2013  . Essential hypertension 02/18/2013  . Hypothyroidism 02/18/2013  . Hyperlipidemia 02/18/2013  . Fibromyalgia 02/18/2013  . Vitamin D deficiency 02/18/2013  . Prediabetes 02/18/2013    Screening Tests Immunization History  Administered Date(s) Administered  . DTaP 01/15/2004  . Influenza, High Dose Seasonal PF 12/26/2013  . Pneumococcal Polysaccharide-23 02/26/2007    Preventative care: Last colonoscopy: 2010 due 2015 Dr. Deatra Ina  Prior vaccinations: TD or Tdap: 2005 OVER DUE, out of in the office Influenza: 2015 Pneumococcal: 2009 Prevnar13: out of in the office Shingles/Zostavax: declines  Names of Other Physician/Practitioners you currently use: 1. [facility] here for primary care 2. Dr. Delman Cheadle, eye doctor, last visit 2 months ago 3. Dr. Angus Palms, dentist, last visit q 6 months Patient Care Team: Unk Pinto, MD as PCP - General (Internal Medicine) Inda Castle, MD as Consulting Physician (Gastroenterology)  Past Surgical History  Procedure Laterality Date  . Appendectomy     Family History  Problem Relation Age of Onset  . Cancer Mother      breast  . Alzheimer's disease Mother   . Hypertension Father   . Heart disease Father   . Lymphoma Father   . Hypertension Brother   . Heart disease Brother   . Heart disease Brother   . Hypertension Brother   . Diabetes Brother    History  Substance Use Topics  . Smoking status: Former Smoker    Quit date: 12/22/2013  . Smokeless tobacco: Not on file     Comment: occasional cigar  . Alcohol Use: 12.6 oz/week    21 Standard drinks or equivalent per week     Comment: drinks wine    MEDICARE WELLNESS OBJECTIVES: Tobacco use: He does not smoke.  Patient is a former smoker. Alcohol Current alcohol use: > 5 glasses of wine per week(s) Caffeine Current caffeine use: coffee 1 /day Osteoporosis: dietary calcium and/or vitamin D deficiency, History of fracture in the past year: no Diet: in general, a "healthy" diet   Physical activity: ADLs and sendentary work Depression/mood screen:   Depression screen [facility] 2/9 07/23/2014  Decreased Interest 0  Down, Depressed, Hopeless 0  PHQ - 2 Score 0   Hearing: normal Visual acuity: normal,  does not perform annual eye exam  ADLs:  In your present state of health, do you have any difficulty performing the following activities: 07/23/2014 09/19/2013  Hearing? N N  Vision? N N  Difficulty concentrating or making decisions? N N  Walking or climbing stairs? N N  Dressing or bathing? N N  Doing errands, shopping? N N  Preparing Food and eating ? N -  Using the Toilet? N -  In the past six months, have you accidently leaked urine? N -  Do you have problems with loss of bowel control? N -  Managing your Medications? N -  Managing your Finances? N -  Housekeeping or managing your Housekeeping? N -    Fall risk: Low Risk Cognitive Testing  Alert? Yes  Normal Appearance?Yes  Oriented to person? Yes  Place? Yes   Time? Yes  Recall of three objects?  Yes  Can perform simple calculations? Yes  Displays appropriate judgment?Yes  Can read the  correct time from a watch face?Yes  EOL planning: Does patient have an advance directive?: No Would patient like information on creating an advanced directive?: Yes - Educational materials given   Review of Systems  Constitutional: Negative.   HENT: Positive for congestion. Negative for ear discharge, ear pain, hearing loss, nosebleeds, sore throat and tinnitus.   Eyes: Negative.  Negative for blurred vision.  Respiratory: Negative.  Negative for shortness of breath and stridor.   Cardiovascular: Positive for chest pain.  Gastrointestinal: Negative.  Negative for abdominal pain, diarrhea and constipation.  Genitourinary: Negative.   Musculoskeletal: Positive for back pain and neck pain. Negative for myalgias, joint pain and falls.  Skin: Negative.   Neurological: Negative.  Negative for headaches.  Psychiatric/Behavioral: Negative.  Negative for depression, suicidal ideas and memory loss. The patient does not have insomnia.   All other systems reviewed and are negative.    Objective:   Blood pressure 118/74, pulse 64, temperature 98.6 F (37 C), temperature source Temporal, resp. rate 16, height 5\' 8"  (1.727 m), weight 163 lb (73.936 kg). Body mass index is 24.79 kg/(m^2).  General appearance: alert, no distress, WD/WN, male HEENT: normocephalic, sclerae anicteric, TMs pearly, nares patent, no discharge or erythema, pharynx normal Oral cavity: MMM, no lesions Neck: supple, no lymphadenopathy, no thyromegaly, no masses Heart: RRR, normal S1, S2, no murmurs Lungs: CTA bilaterally, no wheezes, rhonchi, or rales Abdomen: +bs, soft, non tender, non distended, no masses, no hepatomegaly, no splenomegaly Musculoskeletal: nontender, no swelling, no obvious deformity Extremities: no edema, no cyanosis, no clubbing Pulses: 2+ symmetric, upper and lower extremities, normal cap refill Neurological: alert, oriented x 3, CN2-12 intact, strength normal upper extremities and lower extremities,  sensation normal throughout, DTRs 2+ throughout, no cerebellar signs, gait normal Psychiatric: normal affect, behavior normal, pleasant   Medicare Attestation I have personally reviewed: The patient's medical and social history Their use of alcohol, tobacco or illicit drugs Their current medications and supplements The patient's functional ability including ADLs,fall risks, home safety risks, cognitive, and hearing and visual impairment Diet and physical activities Evidence for depression or mood disorders  The patient's weight, height, BMI, and visual acuity have been recorded in the chart.  I have made referrals, counseling, and provided education to the patient based on review of the above and I have provided the patient with a written personalized care plan for preventive services.     Vicie Mutters, PA-C   07/23/2014

## 2014-07-23 NOTE — Patient Instructions (Addendum)
Call Dr. Deatra Ina for colonoscopy [phone];  Cologuard is an easy to use noninvasive colon cancer screening test based on the latest advances in stool DNA science.   Colon cancer is 3rd most diagnosed cancer and 2nd leading cause of death in both men and women 72 years of age and older despite being one of the most preventable and treatable cancers if found early. You have agreed to do a Cologuard screening and have declined a colonoscopy in spite of being explained the risks and benefits of the colonoscopy in detail, including cancer and death. Please understand that this is test not as sensitive or specific as a colonoscopy and you are still recommended to get a colonoscopy.   If you are NOT medicare please call your insurance company and given them this CPT code, [phone], in order to see how much your insurance company will cover or you can call [phone] to talk with [employer] about pricing and coverage.   You will receive a short call from [place] support center at [employer], when you receive a call they will say they are from [place],  to confirm your mailing address and give you more information.  When they calll you, it will appear on the caller ID as "[employer]" or in some cases only this number will appear, [phone].   [employer] will ship your collection kit directly to you. You will collect a single stool sample in the privacy of your own home, no special preparation required. You will return the kit via [employer] pre-paid shipping or pick-up, in the same box it arrived in. Then I will contact you to discuss your results after I receive them from the laboratory.   If you have any questions or concerns, [employer] Customer Support Specialist are available 24 hours a day, 7 days a week at [phone] or go to [email or web address].       Preventive Care for Adults A healthy lifestyle and preventive care can promote health and  wellness. Preventive health guidelines for men include the following key practices:  A routine yearly physical is a good way to check with your health care provider about your health and preventative screening. It is a chance to share any concerns and updates on your health and to receive a thorough exam.  Visit your dentist for a routine exam and preventative care every 6 months. Brush your teeth twice a day and floss once a day. Good oral hygiene prevents tooth decay and gum disease.  The frequency of eye exams is based on your age, health, family medical history, use of contact lenses, and other factors. Follow your health care provider's recommendations for frequency of eye exams.  Eat a healthy diet. Foods such as vegetables, fruits, whole grains, low-fat dairy products, and lean protein foods contain the nutrients you need without too many calories. Decrease your intake of foods high in solid fats, added sugars, and salt. Eat the right amount of calories for you.Get information about a proper diet from your health care provider, if necessary.  Regular physical exercise is one of the most important things you can do for your health. Most adults should get at least 150 minutes of moderate-intensity exercise (any activity that increases your heart rate and causes you to sweat) each week. In addition, most adults need muscle-strengthening exercises on 2 or more days a week.  Maintain a healthy weight. The body mass index (BMI) is a screening tool to identify possible weight problems. It  provides an estimate of body fat based on height and weight. Your health care provider can find your BMI and can help you achieve or maintain a healthy weight.For adults 20 years and older:  A BMI below 18.5 is considered underweight.  A BMI of 18.5 to 24.9 is normal.  A BMI of 25 to 29.9 is considered overweight.  A BMI of 30 and above is considered obese.  Maintain normal blood lipids and cholesterol  levels by exercising and minimizing your intake of saturated fat. Eat a balanced diet with plenty of fruit and vegetables. Blood tests for lipids and cholesterol should begin at age 82 and be repeated every 5 years. If your lipid or cholesterol levels are high, you are over 50, or you are at high risk for heart disease, you may need your cholesterol levels checked more frequently.Ongoing high lipid and cholesterol levels should be treated with medicines if diet and exercise are not working.  If you smoke, find out from your health care provider how to quit. If you do not use tobacco, do not start.  Lung cancer screening is recommended for adults aged 32-80 years who are at high risk for developing lung cancer because of a history of smoking. A yearly low-dose CT scan of the lungs is recommended for people who have at least a 30-pack-year history of smoking and are a current smoker or have quit within the past 15 years. A pack year of smoking is smoking an average of 1 pack of cigarettes a day for 1 year (for example: 1 pack a day for 30 years or 2 packs a day for 15 years). Yearly screening should continue until the smoker has stopped smoking for at least 15 years. Yearly screening should be stopped for people who develop a health problem that would prevent them from having lung cancer treatment.  If you choose to drink alcohol, do not have more than 2 drinks per day. One drink is considered to be 12 ounces (355 mL) of beer, 5 ounces (148 mL) of wine, or 1.5 ounces (44 mL) of liquor.  Avoid use of street drugs. Do not share needles with anyone. Ask for help if you need support or instructions about stopping the use of drugs.  High blood pressure causes heart disease and increases the risk of stroke. Your blood pressure should be checked at least every 1-2 years. Ongoing high blood pressure should be treated with medicines, if weight loss and exercise are not effective.  If you are 18-33 years old, ask  your health care provider if you should take aspirin to prevent heart disease.  Diabetes screening involves taking a blood sample to check your fasting blood sugar level. Testing should be considered at a younger age or be carried out more frequently if you are overweight and have at least 1 risk factor for diabetes.  Colorectal cancer can be detected and often prevented. Most routine colorectal cancer screening begins at the age of 56 and continues through age 56. However, your health care provider may recommend screening at an earlier age if you have risk factors for colon cancer. On a yearly basis, your health care provider may provide home test kits to check for hidden blood in the stool. Use of a small camera at the end of a tube to directly examine the colon (sigmoidoscopy or colonoscopy) can detect the earliest forms of colorectal cancer. Talk to your health care provider about this at age 5, when routine screening begins. Direct  exam of the colon should be repeated every 5-10 years through age 62, unless early forms of precancerous polyps or small growths are found.  Hepatitis C blood testing is recommended for all people born from [birth year] through 1965 and any individual with known risks for hepatitis C.  New guidelines recommend a once time screening for HIV.   Screening for abdominal aortic aneurysm (AAA)  by ultrasound is recommended for people who have history of high blood pressure or who are current or former smokers.  Healthy men should  receive prostate-specific antigen (PSA) blood tests as part of routine cancer screening. Talk with your health care provider about prostate cancer screening.  Testicular cancer screening is  recommended for adult males. Screening includes self-exam, a health care provider exam, and other screening tests. Consult with your health care provider about any symptoms you have or any concerns you have about testicular cancer.  Use sunscreen. Apply sunscreen  liberally and repeatedly throughout the day. You should seek shade when your shadow is shorter than you. Protect yourself by wearing long sleeves, pants, a wide-brimmed hat, and sunglasses year round, whenever you are outdoors.  Once a month, do a whole-body skin exam, using a mirror to look at the skin on your back. Tell your health care provider about new moles, moles that have irregular borders, moles that are larger than a pencil eraser, or moles that have changed in shape or color.  Stay current with required vaccines (immunizations).  Influenza vaccine. All adults should be immunized every year.  Tetanus, diphtheria, and acellular pertussis (Td, Tdap) vaccine. An adult who has not previously received Tdap or who does not know his vaccine status should receive 1 dose of Tdap. This initial dose should be followed by tetanus and diphtheria toxoids (Td) booster doses every 10 years. Adults with an unknown or incomplete history of completing a 3-dose immunization series with Td-containing vaccines should begin or complete a primary immunization series including a Tdap dose. Adults should receive a Td booster every 10 years.  Zoster vaccine. One dose is recommended for adults aged 27 years or older unless certain conditions are present.    PREVNAR - Pneumococcal 13-valent conjugate (PCV13) vaccine. When indicated, a person who is uncertain of his immunization history and has no record of immunization should receive the PCV13 vaccine. An adult aged 65 years or older who has certain medical conditions and has not been previously immunized should receive 1 dose of PCV13 vaccine. This PCV13 should be followed with a dose of pneumococcal polysaccharide (PPSV23) vaccine. The PPSV23 vaccine dose should be obtained at least 8 weeks after the dose of PCV13 vaccine. An adult aged 68 years or older who has certain medical conditions and previously received 1 or more doses of PPSV23 vaccine should receive 1 dose  of PCV13. The PCV13 vaccine dose should be obtained 1 or more years after the last PPSV23 vaccine dose.    PNEUMOVAX - Pneumococcal polysaccharide (PPSV23) vaccine. When PCV13 is also indicated, PCV13 should be obtained first. All adults aged 63 years and older should be immunized. An adult younger than age 59 years who has certain medical conditions should be immunized. Any person who resides in a nursing home or long-term care facility should be immunized. An adult smoker should be immunized. People with an immunocompromised condition and certain other conditions should receive both PCV13 and PPSV23 vaccines. People with human immunodeficiency virus (HIV) infection should be immunized as soon as possible after diagnosis. Immunization during chemotherapy  or radiation therapy should be avoided. Routine use of PPSV23 vaccine is not recommended for American Indians, [place] Natives, or people younger than 65 years unless there are medical conditions that require PPSV23 vaccine. When indicated, people who have unknown immunization and have no record of immunization should receive PPSV23 vaccine. One-time revaccination 5 years after the first dose of PPSV23 is recommended for people aged 19-64 years who have chronic kidney failure, nephrotic syndrome, asplenia, or immunocompromised conditions. People who received 1-2 doses of PPSV23 before age 52 years should receive another dose of PPSV23 vaccine at age 79 years or later if at least 5 years have passed since the previous dose. Doses of PPSV23 are not needed for people immunized with PPSV23 at or after age 74 years.    Hepatitis A vaccine. Adults who wish to be protected from this disease, have certain high-risk conditions, work with hepatitis A-infected animals, work in hepatitis A research labs, or travel to or work in countries with a high rate of hepatitis A should be immunized. Adults who were previously unvaccinated and who anticipate close contact with an  international adoptee during the first 60 days after arrival in the [place] from a country with a high rate of hepatitis A should be immunized.    Hepatitis B vaccine. Adults should be immunized if they wish to be protected from this disease, have certain high-risk conditions, may be exposed to blood or other infectious body fluids, are household contacts or sex partners of hepatitis B positive people, are clients or workers in certain care facilities, or travel to or work in countries with a high rate of hepatitis B.   Preventive Service / Frequency   Ages 31 and over  Blood pressure check.  Lipid and cholesterol check.  Lung cancer screening. / Every year if you are aged 67-80 years and have a 30-pack-year history of smoking and currently smoke or have quit within the past 15 years. Yearly screening is stopped once you have quit smoking for at least 15 years or develop a health problem that would prevent you from having lung cancer treatment.  Fecal occult blood test (FOBT) of stool. You may not have to do this test if you get a colonoscopy every 10 years.  Flexible sigmoidoscopy** or colonoscopy.** / Every 5 years for a flexible sigmoidoscopy or every 10 years for a colonoscopy beginning at age 56 and continuing until age 63.  Hepatitis C blood test.** / For all people born from [birth year] through 1965 and any individual with known risks for hepatitis C.  Abdominal aortic aneurysm (AAA) screening./ Screening current or former smokers or have Hypertension.  Skin self-exam. / Monthly.  Influenza vaccine. / Every year.  Tetanus, diphtheria, and acellular pertussis (Tdap/Td) vaccine.** / 1 dose of Td every 10 years.   Zoster vaccine.** / 1 dose for adults aged 7 years or older.         Pneumococcal 13-valent conjugate (PCV13) vaccine.    Pneumococcal polysaccharide (PPSV23) vaccine.     Hepatitis A vaccine.** / Consult your health care provider.  Hepatitis B vaccine.** /  Consult your health care provider. Screening for abdominal aortic aneurysm (AAA)  by ultrasound is recommended for people who have history of high blood pressure or who are current or former smokers.

## 2014-07-24 LAB — CBC WITH DIFFERENTIAL/PLATELET
BASOS ABS: 0 10*3/uL (ref 0.0–0.1)
BASOS PCT: 0 % (ref 0–1)
Eosinophils Absolute: 0.1 10*3/uL (ref 0.0–0.7)
Eosinophils Relative: 2 % (ref 0–5)
HEMATOCRIT: 39.2 % (ref 39.0–52.0)
HEMOGLOBIN: 13.4 g/dL (ref 13.0–17.0)
LYMPHS ABS: 1.3 10*3/uL (ref 0.7–4.0)
Lymphocytes Relative: 25 % (ref 12–46)
MCH: 31.1 pg (ref 26.0–34.0)
MCHC: 34.2 g/dL (ref 30.0–36.0)
MCV: 91 fL (ref 78.0–100.0)
MONOS PCT: 11 % (ref 3–12)
MPV: 9.9 fL (ref 8.6–12.4)
Monocytes Absolute: 0.6 10*3/uL (ref 0.1–1.0)
NEUTROS ABS: 3.1 10*3/uL (ref 1.7–7.7)
Neutrophils Relative %: 62 % (ref 43–77)
PLATELETS: 266 10*3/uL (ref 150–400)
RBC: 4.31 MIL/uL (ref 4.22–5.81)
RDW: 13.5 % (ref 11.5–15.5)
WBC: 5 10*3/uL (ref 4.0–10.5)

## 2014-07-24 LAB — HEMOGLOBIN A1C
Hgb A1c MFr Bld: 5.8 % — ABNORMAL HIGH (ref <5.7)
Mean Plasma Glucose: 120 mg/dL — ABNORMAL HIGH (ref <117)

## 2014-07-24 LAB — LIPID PANEL
CHOL/HDL RATIO: 3.1 ratio
CHOLESTEROL: 199 mg/dL (ref 0–200)
HDL: 65 mg/dL (ref >=40)
LDL Cholesterol: 106 mg/dL — ABNORMAL HIGH (ref 0–99)
TRIGLYCERIDES: 142 mg/dL (ref <150)
VLDL: 28 mg/dL (ref 0–40)

## 2014-07-24 LAB — BASIC METABOLIC PANEL WITH GFR
BUN: 21 mg/dL (ref 6–23)
CO2: 25 mEq/L (ref 19–32)
Calcium: 9.4 mg/dL (ref 8.4–10.5)
Chloride: 102 mEq/L (ref 96–112)
Creat: 0.9 mg/dL (ref 0.50–1.35)
GFR, EST NON AFRICAN AMERICAN: 85 mL/min
GFR, Est African American: >89 mL/min
Glucose, Bld: 98 mg/dL (ref 70–99)
Potassium: 4.5 mEq/L (ref 3.5–5.3)
SODIUM: 138 meq/L (ref 135–145)

## 2014-07-24 LAB — HEPATIC FUNCTION PANEL
ALT: 21 U/L (ref 0–53)
AST: 34 U/L (ref 0–37)
Albumin: 3.9 g/dL (ref 3.5–5.2)
Alkaline Phosphatase: 72 U/L (ref 39–117)
Bilirubin, Direct: 0.1 mg/dL (ref 0.0–0.3)
Indirect Bilirubin: 0.4 mg/dL (ref 0.2–1.2)
Total Bilirubin: 0.5 mg/dL (ref 0.2–1.2)
Total Protein: 6.1 g/dL (ref 6.0–8.3)

## 2014-07-24 LAB — INSULIN, FASTING: Insulin fasting, serum: 2.7 u[IU]/mL (ref 2.0–19.6)

## 2014-07-24 LAB — MAGNESIUM: Magnesium: 2 mg/dL (ref 1.5–2.5)

## 2014-07-24 LAB — TSH: TSH: 3.201 u[IU]/mL (ref 0.350–4.500)

## 2014-07-24 LAB — VITAMIN D 25 HYDROXY (VIT D DEFICIENCY, FRACTURES): Vit D, 25-Hydroxy: 77 ng/mL (ref 30–100)

## 2014-08-05 ENCOUNTER — Encounter: Payer: Self-pay | Admitting: Gastroenterology

## 2014-08-10 ENCOUNTER — Other Ambulatory Visit: Payer: Self-pay | Admitting: Internal Medicine

## 2014-08-25 ENCOUNTER — Other Ambulatory Visit: Payer: Self-pay | Admitting: Internal Medicine

## 2014-09-30 ENCOUNTER — Encounter: Payer: Self-pay | Admitting: Internal Medicine

## 2014-11-05 ENCOUNTER — Ambulatory Visit (INDEPENDENT_AMBULATORY_CARE_PROVIDER_SITE_OTHER): Payer: Medicare Other | Admitting: Internal Medicine

## 2014-11-05 ENCOUNTER — Encounter: Payer: Self-pay | Admitting: Internal Medicine

## 2014-11-05 VITALS — BP 126/82 | HR 64 | Temp 97.5°F | Resp 16 | Ht 68.0 in | Wt 158.2 lb

## 2014-11-05 DIAGNOSIS — Z1389 Encounter for screening for other disorder: Secondary | ICD-10-CM | POA: Diagnosis not present

## 2014-11-05 DIAGNOSIS — I10 Essential (primary) hypertension: Secondary | ICD-10-CM

## 2014-11-05 DIAGNOSIS — Z79899 Other long term (current) drug therapy: Secondary | ICD-10-CM | POA: Diagnosis not present

## 2014-11-05 DIAGNOSIS — Z23 Encounter for immunization: Secondary | ICD-10-CM

## 2014-11-05 DIAGNOSIS — Z9181 History of falling: Secondary | ICD-10-CM

## 2014-11-05 DIAGNOSIS — F324 Major depressive disorder, single episode, in partial remission: Secondary | ICD-10-CM

## 2014-11-05 DIAGNOSIS — F325 Major depressive disorder, single episode, in full remission: Secondary | ICD-10-CM

## 2014-11-05 DIAGNOSIS — Z125 Encounter for screening for malignant neoplasm of prostate: Secondary | ICD-10-CM | POA: Diagnosis not present

## 2014-11-05 DIAGNOSIS — R7303 Prediabetes: Secondary | ICD-10-CM

## 2014-11-05 DIAGNOSIS — M797 Fibromyalgia: Secondary | ICD-10-CM

## 2014-11-05 DIAGNOSIS — E782 Mixed hyperlipidemia: Secondary | ICD-10-CM

## 2014-11-05 DIAGNOSIS — E039 Hypothyroidism, unspecified: Secondary | ICD-10-CM

## 2014-11-05 DIAGNOSIS — Z789 Other specified health status: Secondary | ICD-10-CM

## 2014-11-05 DIAGNOSIS — R7309 Other abnormal glucose: Secondary | ICD-10-CM | POA: Diagnosis not present

## 2014-11-05 DIAGNOSIS — Z6824 Body mass index (BMI) 24.0-24.9, adult: Secondary | ICD-10-CM

## 2014-11-05 DIAGNOSIS — Z1331 Encounter for screening for depression: Secondary | ICD-10-CM

## 2014-11-05 DIAGNOSIS — Z1212 Encounter for screening for malignant neoplasm of rectum: Secondary | ICD-10-CM

## 2014-11-05 DIAGNOSIS — E559 Vitamin D deficiency, unspecified: Secondary | ICD-10-CM | POA: Diagnosis not present

## 2014-11-05 DIAGNOSIS — M790 Rheumatism, unspecified: Secondary | ICD-10-CM

## 2014-11-05 NOTE — Progress Notes (Signed)
Patient ID: Gregory Galvan, male   DOB: 05-22-1940, 74 y.o.   MRN: [id number]   Comprehensive Examination  This very nice 74 y.o. SWM presents for comprehensive evaluation and examination.  Patient has been followed for HTN, Prediabetes, Hyperlipidemia, Hypothyroidism and Vitamin D Deficiency. Patient also  has long standing Fibromyalgia controlled over the years with Paroxetine.    HTN predates since 1989. Patient's BP has been controlled at home.Today's BP: 126/82 mmHg. Patient denies any cardiac symptoms as chest pain, palpitations, shortness of breath, dizziness or ankle swelling.   Patient's hyperlipidemia is not controlled with diet. Last lipids were not at goal - Cholesterol 199; HDL 65; LDL 106*; Triglycerides 142 on 07/23/2014.   Patient has prediabetes since Jan 2014 with A1c 5.8%  and patient denies reactive hypoglycemic symptoms, visual blurring, diabetic polys or paresthesias. Last A1c was 5.8% on 07/23/2014.   Patient hasbeen on Thyroid Replacement since 1997.   Finally, patient has history of Vitamin D Deficiency of 24 in 2008 and last vitamin D was 77 on 07/23/2014.      Medication Sig  . VITAMIN D 1000 UNITS  Take 8,000 Units by mouth daily.  [name] labetalol  200 MG Take 1/2 to 1 tablet 2 x day as recommended for BP  . XALATAN 0.005 % ophth soln at bedtime.   [name] levothyroxine  175 MCG  take 1 and 1/2 tablet by mouth once daily or as directed  . lisinopril-hctz 20-25  take 1 tablet by mouth once daily  . meloxicam 15 MG  Take 1/2 to 1 tab daily with food for pain and inflammation.  [name] PARoxetine  20 MG  take 1 tablet by mouth once daily   Allergies  Allergen Reactions  . Penicillins     REACTION: rash,swelling  . 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 Sulfonamide Derivatives     REACTION: itching,swelling   Past Medical History  Diagnosis Date  . Hypertension   . Hyperlipidemia   . Thyroid disease   . Hypogonadism, male   .  Prostatitis   . BPH (benign prostatic hyperplasia)   . Pre-diabetes    Health Maintenance  Topic Date Due  . TETANUS/TDAP  03/25/1961  . ZOSTAVAX  03/25/2002  . PNA vac Low Risk Adult (1 of 2 - PCV13) 02/26/2008  . INFLUENZA VACCINE  09/15/2014  . COLONOSCOPY  04/25/2019   Immunization History  Administered Date(s) Administered  . DTaP 01/15/2004  . Influenza, High Dose Seasonal PF 12/26/2013  . Pneumococcal Polysaccharide-23 02/26/2007   Past Surgical History  Procedure Laterality Date  . Appendectomy     Family History  Problem Relation Age of Onset  . Cancer Mother     breast  . Alzheimer's disease Mother   . Hypertension Father   . Heart disease Father   . Lymphoma Father   . Hypertension Brother   . Heart disease Brother   . Heart disease Brother   . Hypertension Brother   . Diabetes Brother    Social History   Social History  . Marital Status: Divorced    Spouse Name: N/A  . Number of Children: N/A  . Years of Education: N/A   Occupational History  . 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    Social History Main Topics  . Smoking status: Former Smoker    Quit date: 12/22/2013  . Smokeless tobacco: Not on file     Comment: occasional cigar  . Alcohol  Use: 16.8 oz/week    28 Standard drinks or equivalent per week     Comment: drinks wine  . Drug Use: No  . Sexual Activity: Not on file    ROS Constitutional: Denies fever, chills, weight loss/gain, headaches, insomnia,  night sweats or change in appetite. Does c/o fatigue. Eyes: Denies redness, blurred vision, diplopia, discharge, itchy or watery eyes.  ENT: Denies discharge, congestion, post nasal drip, epistaxis, sore throat, earache, hearing loss, dental pain, Tinnitus, Vertigo, Sinus pain or snoring.  Cardio: Denies chest pain, palpitations, irregular heartbeat, syncope, dyspnea, diaphoresis, orthopnea, PND, claudication or edema Respiratory: denies cough, dyspnea, DOE, pleurisy, hoarseness, laryngitis  or wheezing.  Gastrointestinal: Denies dysphagia, heartburn, reflux, water brash, pain, cramps, nausea, vomiting, bloating, diarrhea, constipation, hematemesis, melena, hematochezia, jaundice or hemorrhoids Genitourinary: Denies dysuria, frequency, urgency, nocturia, hesitancy, discharge, hematuria or flank pain Musculoskeletal: Denies arthralgia, myalgia, stiffness, Jt. Swelling, pain, limp or strain/sprain. Denies Falls. Skin: Denies puritis, rash, hives, warts, acne, eczema or change in skin lesion Neuro: No weakness, tremor, incoordination, spasms, paresthesia or pain Psychiatric: Denies confusion, memory loss or sensory loss. Denies Depression. Endocrine: Denies change in weight, skin, hair change, nocturia, and paresthesia, diabetic polys, visual blurring or hyper / hypo glycemic episodes.  Heme/Lymph: No excessive bleeding, bruising or enlarged lymph nodes.  Physical Exam  BP 126/82 mmHg  Pulse 64  Temp(Src) 97.5 F (36.4 C)  Resp 16  Ht 5\' 8"  (1.727 m)  Wt 158 lb 3.2 oz (71.759 kg)  BMI 24.06 kg/m2  General Appearance: Well nourished &  in no apparent distress. Eyes: PERRLA, EOMs, conjunctiva no swelling or erythema, normal fundi and vessels. Sinuses: No frontal/maxillary tenderness ENT/Mouth: EACs patent / TMs  nl. Nares clear without erythema, swelling, mucoid exudates. Oral hygiene is good. No erythema, swelling, or exudate. Tongue normal, non-obstructing. Tonsils not swollen or erythematous. Hearing normal.  Neck: Supple, thyroid normal. No bruits, nodes or JVD. Respiratory: Respiratory effort normal.  BS equal and clear bilateral without rales, rhonci, wheezing or stridor. Cardio: Heart sounds are normal with regular rate and rhythm and no murmurs, rubs or gallops. Peripheral pulses are normal and equal bilaterally without edema. No aortic or femoral bruits. Chest: symmetric with normal excursions and percussion.  Abdomen: Flat, soft, with bowel sounds. Nontender, no  guarding, rebound, hernias, masses, or organomegaly.  Lymphatics: Non tender without lymphadenopathy.  Genitourinary: No hernias.Testes nl. DRE - prostate nl for age - smooth & firm w/o nodules. Musculoskeletal: Full ROM all peripheral extremities, joint stability, 5/5 strength, and normal gait. Skin: Warm and dry without rashes, lesions, cyanosis, clubbing or  ecchymosis.  Neuro: Cranial nerves intact, reflexes equal bilaterally. Normal muscle tone, no cerebellar symptoms. Sensation intact.  Pysch: Alert and oriented X 3 with normal affect, insight and judgment appropriate.   Assessment and Plan  1. Essential hypertension  - Microalbumin / creatinine urine ratio - EKG 12-Lead - [place], RETROPERITNL ABD,  LTD - TSH  2. Hyperlipidemia  - Lipid panel  3. Prediabetes  - Hemoglobin A1c - Insulin, random  4. Vitamin D deficiency  - Vit D  25 hydroxy   5. Hypothyroidism  - TSH  6. Depression, major, in remission   7. Fibromyalgia   8. Screening for rectal cancer  - POC Hemoccult Bld/Stl (3-Cd Home Screen); Future  9. Prostate cancer screening  - PSA  10. Body mass index (BMI) of 24.0-24.9 in adult   11. Medication management  - Urinalysis, Routine w reflex microscopic  - CBC with  Differential/Platelet - BASIC METABOLIC PANEL WITH GFR - Hepatic function panel - Magnesium  12. Need for prophylactic vaccination and inoculation against influenza  - Flu vaccine HIGH DOSE PF (Fluzone High dose)   Continue prudent diet as discussed, weight control, BP monitoring, regular exercise, and medications as discussed.  Discussed med effects and SE's. Routine screening labs and tests as requested with regular follow-up as recommended.  Over 40 minutes of exam, counseling &  chart review was performed

## 2014-11-05 NOTE — Patient Instructions (Signed)

## 2014-11-06 LAB — PSA: PSA: 0.59 ng/mL (ref <=4.00)

## 2014-11-06 LAB — VITAMIN D 25 HYDROXY (VIT D DEFICIENCY, FRACTURES): VIT D 25 HYDROXY: 89 ng/mL (ref 30–100)

## 2014-11-06 LAB — BASIC METABOLIC PANEL WITH GFR
BUN: 15 mg/dL (ref 7–25)
CALCIUM: 9.4 mg/dL (ref 8.6–10.3)
CHLORIDE: 102 mmol/L (ref 98–110)
CO2: 27 mmol/L (ref 20–31)
CREATININE: 0.93 mg/dL (ref 0.70–1.18)
GFR, Est African American: >89 mL/min (ref >=60)
GFR, Est Non African American: 82 mL/min (ref >=60)
Glucose, Bld: 120 mg/dL — ABNORMAL HIGH (ref 65–99)
Potassium: 4 mmol/L (ref 3.5–5.3)
SODIUM: 139 mmol/L (ref 135–146)

## 2014-11-06 LAB — HEPATIC FUNCTION PANEL
ALBUMIN: 3.7 g/dL (ref 3.6–5.1)
ALT: 28 U/L (ref 9–46)
AST: 44 U/L — AB (ref 10–35)
Alkaline Phosphatase: 69 U/L (ref 40–115)
BILIRUBIN DIRECT: 0.1 mg/dL (ref <=0.2)
Indirect Bilirubin: 0.5 mg/dL (ref 0.2–1.2)
Total Bilirubin: 0.6 mg/dL (ref 0.2–1.2)
Total Protein: 5.6 g/dL — ABNORMAL LOW (ref 6.1–8.1)

## 2014-11-06 LAB — URINALYSIS, ROUTINE W REFLEX MICROSCOPIC
BILIRUBIN URINE: NEGATIVE
GLUCOSE, UA: NEGATIVE
HGB URINE DIPSTICK: NEGATIVE
Ketones, ur: NEGATIVE
Leukocytes, UA: NEGATIVE
Nitrite: NEGATIVE
PH: 7 (ref 5.0–8.0)
Specific Gravity, Urine: 1.01 (ref 1.001–1.035)

## 2014-11-06 LAB — CBC WITH DIFFERENTIAL/PLATELET
BASOS PCT: 0 % (ref 0–1)
Basophils Absolute: 0 10*3/uL (ref 0.0–0.1)
EOS ABS: 0.1 10*3/uL (ref 0.0–0.7)
Eosinophils Relative: 2 % (ref 0–5)
HCT: 39 % (ref 39.0–52.0)
HEMOGLOBIN: 13.4 g/dL (ref 13.0–17.0)
LYMPHS ABS: 1.3 10*3/uL (ref 0.7–4.0)
Lymphocytes Relative: 32 % (ref 12–46)
MCH: 31.9 pg (ref 26.0–34.0)
MCHC: 34.4 g/dL (ref 30.0–36.0)
MCV: 92.9 fL (ref 78.0–100.0)
MONO ABS: 0.5 10*3/uL (ref 0.1–1.0)
MONOS PCT: 12 % (ref 3–12)
MPV: 10.5 fL (ref 8.6–12.4)
Neutro Abs: 2.2 10*3/uL (ref 1.7–7.7)
Neutrophils Relative %: 54 % (ref 43–77)
PLATELETS: 239 10*3/uL (ref 150–400)
RBC: 4.2 MIL/uL — ABNORMAL LOW (ref 4.22–5.81)
RDW: 13.6 % (ref 11.5–15.5)
WBC: 4 10*3/uL (ref 4.0–10.5)

## 2014-11-06 LAB — URINALYSIS, MICROSCOPIC ONLY
Bacteria, UA: NONE SEEN [HPF]
Casts: NONE SEEN [LPF]
Crystals: NONE SEEN [HPF]
RBC / HPF: NONE SEEN RBC/HPF (ref <=2)
Squamous Epithelial / LPF: NONE SEEN [HPF] (ref <=5)
WBC UA: NONE SEEN WBC/HPF (ref <=5)
YEAST: NONE SEEN [HPF]

## 2014-11-06 LAB — MICROALBUMIN / CREATININE URINE RATIO
Creatinine, Urine: 63 mg/dL
MICROALB/CREAT RATIO: 666.7 mg/g — AB (ref 0.0–30.0)
Microalb, Ur: 42 mg/dL — ABNORMAL HIGH (ref <2.0)

## 2014-11-06 LAB — LIPID PANEL
CHOLESTEROL: 192 mg/dL (ref 125–200)
HDL: 85 mg/dL (ref >=40)
LDL Cholesterol: 81 mg/dL (ref <130)
Total CHOL/HDL Ratio: 2.3 Ratio (ref <=5.0)
Triglycerides: 129 mg/dL (ref <150)
VLDL: 26 mg/dL (ref <30)

## 2014-11-06 LAB — MAGNESIUM: MAGNESIUM: 2 mg/dL (ref 1.5–2.5)

## 2014-11-06 LAB — HEMOGLOBIN A1C
HEMOGLOBIN A1C: 5.4 % (ref <5.7)
MEAN PLASMA GLUCOSE: 108 mg/dL (ref <117)

## 2014-11-06 LAB — INSULIN, RANDOM: INSULIN: 5.7 u[IU]/mL (ref 2.0–19.6)

## 2014-11-06 LAB — TSH: TSH: 2.838 u[IU]/mL (ref 0.350–4.500)

## 2015-02-04 ENCOUNTER — Encounter: Payer: Self-pay | Admitting: Internal Medicine

## 2015-02-04 ENCOUNTER — Ambulatory Visit (INDEPENDENT_AMBULATORY_CARE_PROVIDER_SITE_OTHER): Payer: Medicare Other | Admitting: Internal Medicine

## 2015-02-04 VITALS — BP 106/60 | HR 64 | Temp 98.0°F | Resp 16 | Ht 67.0 in | Wt 156.0 lb

## 2015-02-04 DIAGNOSIS — E782 Mixed hyperlipidemia: Secondary | ICD-10-CM

## 2015-02-04 DIAGNOSIS — E039 Hypothyroidism, unspecified: Secondary | ICD-10-CM

## 2015-02-04 DIAGNOSIS — Z79899 Other long term (current) drug therapy: Secondary | ICD-10-CM

## 2015-02-04 DIAGNOSIS — Z0000 Encounter for general adult medical examination without abnormal findings: Secondary | ICD-10-CM | POA: Diagnosis not present

## 2015-02-04 DIAGNOSIS — Z23 Encounter for immunization: Secondary | ICD-10-CM

## 2015-02-04 DIAGNOSIS — E559 Vitamin D deficiency, unspecified: Secondary | ICD-10-CM

## 2015-02-04 DIAGNOSIS — I10 Essential (primary) hypertension: Secondary | ICD-10-CM

## 2015-02-04 DIAGNOSIS — R7303 Prediabetes: Secondary | ICD-10-CM

## 2015-02-04 LAB — CBC WITH DIFFERENTIAL/PLATELET
Basophils Absolute: 0 10*3/uL (ref 0.0–0.1)
Basophils Relative: 1 % (ref 0–1)
EOS ABS: 0.2 10*3/uL (ref 0.0–0.7)
EOS PCT: 4 % (ref 0–5)
HEMATOCRIT: 36.2 % — AB (ref 39.0–52.0)
Hemoglobin: 12.8 g/dL — ABNORMAL LOW (ref 13.0–17.0)
LYMPHS ABS: 1.4 10*3/uL (ref 0.7–4.0)
LYMPHS PCT: 37 % (ref 12–46)
MCH: 33.3 pg (ref 26.0–34.0)
MCHC: 35.4 g/dL (ref 30.0–36.0)
MCV: 94.3 fL (ref 78.0–100.0)
MONO ABS: 0.5 10*3/uL (ref 0.1–1.0)
MONOS PCT: 14 % — AB (ref 3–12)
MPV: 9.8 fL (ref 8.6–12.4)
Neutro Abs: 1.7 10*3/uL (ref 1.7–7.7)
Neutrophils Relative %: 44 % (ref 43–77)
PLATELETS: 269 10*3/uL (ref 150–400)
RBC: 3.84 MIL/uL — ABNORMAL LOW (ref 4.22–5.81)
RDW: 13.1 % (ref 11.5–15.5)
WBC: 3.8 10*3/uL — ABNORMAL LOW (ref 4.0–10.5)

## 2015-02-04 NOTE — Progress Notes (Signed)
Patient ID: Gregory Galvan, male   DOB: 08/07/1940, 74 y.o.   MRN: [id number]   Annual  Screening/Preventative Visit And Comprehensive Evaluation & Examination  This very nice 74 y.o.male presents for presents for a Wellness/Preventative Visit & comprehensive evaluation and management of multiple medical co-morbidities.  Patient has been followed for HTN,  Prediabetes, Hyperlipidemia and Vitamin D Deficiency.   HTN managed with diet, exercise, and medication. Patient's BP has been controlled at home.Today's BP: 106/60 mmHg. Patient denies any cardiac symptoms as chest pain, palpitations, shortness of breath, dizziness or ankle swelling.   Patient's hyperlipidemia is controlled with diet and medications. Patient denies myalgias or other medication SE's. Last lipids were 11/05/2014: Cholesterol 192; HDL 85; LDL Cholesterol 81; Triglycerides 129   Patient has prediabetes and patient denies reactive hypoglycemic symptoms, visual blurring, diabetic polys or paresthesias. Last A1c was 11/05/2014: Hgb A1c MFr Bld 5.4     Finally, patient has history of Vitamin D Deficiency  and last vitamin D was 11/05/2014: Vit D, 25-Hydroxy 89    Current Outpatient Prescriptions on File Prior to Visit  Medication Sig Dispense Refill  . Cholecalciferol (VITAMIN D-3) 1000 UNITS CAPS Take 8,000 Units by mouth daily.    [name] labetalol (NORMODYNE) 200 MG tablet Take 1/2 to 1 tablet 2 x day as recommended for BP 90 tablet 99  . latanoprost (XALATAN) 0.005 % ophthalmic solution at bedtime.     [name] levothyroxine (SYNTHROID, LEVOTHROID) 175 MCG tablet take 1 and 1/2 tablet by mouth once daily or as directed 135 tablet 99  . lisinopril-hydrochlorothiazide (PRINZIDE,ZESTORETIC) 20-25 MG per tablet take 1 tablet by mouth once daily 90 tablet 1  . meloxicam (MOBIC) 15 MG tablet Take 1/2 to 1 tab daily with food for pain and inflammation. 90 tablet 0  . PARoxetine (PAXIL) 20 MG tablet take 1 tablet by mouth once daily 90 tablet 99   No  current facility-administered medications on file prior to visit.   Allergies  Allergen Reactions  . Penicillins     REACTION: rash,swelling  . 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 Sulfonamide Derivatives     REACTION: itching,swelling   Past Medical History  Diagnosis Date  . Hypertension   . Hyperlipidemia   . Thyroid disease   . Hypogonadism, male   . Prostatitis   . BPH (benign prostatic hyperplasia)   . Pre-diabetes    Health Maintenance  Topic Date Due  . ZOSTAVAX  03/25/2002  . PNA vac Low Risk Adult (1 of 2 - PCV13) 02/26/2008  . INFLUENZA VACCINE  09/15/2015  . COLONOSCOPY  04/25/2019  . TETANUS/TDAP  02/03/2025   Immunization History  Administered Date(s) Administered  . DT 02/04/2015  . DTaP 01/15/2004  . Influenza, High Dose Seasonal PF 12/26/2013, 11/05/2014  . Pneumococcal Polysaccharide-23 02/26/2007   Past Surgical History  Procedure Laterality Date  . Appendectomy     Family History  Problem Relation Age of Onset  . Cancer Mother     breast  . Alzheimer's disease Mother   . Hypertension Father   . Heart disease Father   . Lymphoma Father   . Hypertension Brother   . Heart disease Brother   . Heart disease Brother   . Hypertension Brother   . Diabetes Brother     Social History   Social History  . Marital Status: Divorced    Spouse Name: N/A  . Number of Children: N/A  . Years of  Education: N/A   Occupational History  . Not on file.   Social History Main Topics  . Smoking status: Former Smoker    Quit date: 12/22/2013  . Smokeless tobacco: Not on file     Comment: occasional cigar  . Alcohol Use: 16.8 oz/week    28 Standard drinks or equivalent per week     Comment: drinks wine  . Drug Use: No  . Sexual Activity: Not on file   Other Topics Concern  . Not on file   Social History Narrative   Review of Systems  Constitutional: Negative for fever and malaise/fatigue.  HENT:  Negative for congestion, ear pain and sore throat.   Respiratory: Negative for cough, shortness of breath and wheezing.   Cardiovascular: Negative for chest pain, palpitations and leg swelling.  Gastrointestinal: Negative for diarrhea, constipation, blood in stool and melena.  Genitourinary: Negative.   Neurological: Negative for dizziness, loss of consciousness and headaches.  Psychiatric/Behavioral: Negative for depression. The patient is not nervous/anxious and does not have insomnia.      Physical Exam  BP 106/60 mmHg  Pulse 64  Temp(Src) 98 F (36.7 C) (Temporal)  Resp 16  Ht 5\' 7"  (1.702 m)  Wt 156 lb (70.761 kg)  BMI 24.43 kg/m2  General Appearance: Well nourished, in no apparent distress. Eyes: PERRLA, EOMs, conjunctiva no swelling or erythema, normal fundi and vessels. Sinuses: No frontal/maxillary tenderness ENT/Mouth: EACs patent / TMs  nl. Nares clear without erythema, swelling, mucoid exudates. Oral hygiene is good. No erythema, swelling, or exudate. Tongue normal, non-obstructing. Tonsils not swollen or erythematous. Hearing normal.  Neck: Supple, thyroid normal. No bruits, nodes or JVD. Respiratory: Respiratory effort normal.  BS equal and clear bilateral without rales, rhonci, wheezing or stridor. Cardio: Heart sounds are normal with regular rate and rhythm and no murmurs, rubs or gallops. Peripheral pulses are normal and equal bilaterally without edema. No aortic or femoral bruits. Chest: symmetric with normal excursions and percussion.  Abdomen: Flat, soft, with bowl sounds. Nontender, no guarding, rebound, hernias, masses, or organomegaly.  Lymphatics: Non tender without lymphadenopathy.  Musculoskeletal: Full ROM all peripheral extremities, joint stability, 5/5 strength, and normal gait. Skin: Warm and dry without rashes, lesions, cyanosis, clubbing or  ecchymosis.  Neuro: Cranial nerves intact, reflexes equal bilaterally. Normal muscle tone, no cerebellar  symptoms. Sensation intact.  Pysch: Alert and oriented X 3 with normal affect, insight and judgment appropriate.   Assessment and Plan  1. Essential hypertension  - TSH  2. Hypothyroidism, unspecified hypothyroidism type  - Hemoglobin A1c  3. Hyperlipidemia  - Lipid panel  4. Prediabetes -cont diet and exercise  5. Vitamin D deficiency -cont supplement  6. Need for prophylactic vaccination with tetanus-diphtheria (TD)  - DT Vaccine greater than 7yo IM  7. Medication management  - CBC with Differential/Platelet - BASIC METABOLIC PANEL WITH GFR - Hepatic function panel      Continue prudent diet as discussed, weight control, BP monitoring, regular exercise, and medications as discussed.  Discussed med effects and SE's. Routine screening labs and tests as requested with regular follow-up as recommended. Over 40 minutes of exam, counseling, chart review and high complex critical decision making was performed

## 2015-02-05 LAB — LIPID PANEL
CHOL/HDL RATIO: 1.8 ratio (ref <=5.0)
CHOLESTEROL: 164 mg/dL (ref 125–200)
HDL: 90 mg/dL (ref >=40)
LDL Cholesterol: 58 mg/dL (ref <130)
Triglycerides: 78 mg/dL (ref <150)
VLDL: 16 mg/dL (ref <30)

## 2015-02-05 LAB — HEPATIC FUNCTION PANEL
ALBUMIN: 3.4 g/dL — AB (ref 3.6–5.1)
ALK PHOS: 83 U/L (ref 40–115)
ALT: 21 U/L (ref 9–46)
AST: 29 U/L (ref 10–35)
BILIRUBIN TOTAL: 0.4 mg/dL (ref 0.2–1.2)
Bilirubin, Direct: 0.1 mg/dL (ref <=0.2)
Indirect Bilirubin: 0.3 mg/dL (ref 0.2–1.2)
TOTAL PROTEIN: 5.4 g/dL — AB (ref 6.1–8.1)

## 2015-02-05 LAB — BASIC METABOLIC PANEL WITH GFR
BUN: 16 mg/dL (ref 7–25)
CO2: 26 mmol/L (ref 20–31)
CREATININE: 0.82 mg/dL (ref 0.70–1.18)
Calcium: 9.2 mg/dL (ref 8.6–10.3)
Chloride: 102 mmol/L (ref 98–110)
GFR, Est Non African American: 88 mL/min (ref >=60)
GLUCOSE: 94 mg/dL (ref 65–99)
Potassium: 4.7 mmol/L (ref 3.5–5.3)
Sodium: 138 mmol/L (ref 135–146)

## 2015-02-05 LAB — TSH: TSH: 4.54 u[IU]/mL — ABNORMAL HIGH (ref 0.350–4.500)

## 2015-02-05 LAB — HEMOGLOBIN A1C
HEMOGLOBIN A1C: 5.3 % (ref <5.7)
MEAN PLASMA GLUCOSE: 105 mg/dL (ref <117)

## 2015-03-06 ENCOUNTER — Ambulatory Visit (INDEPENDENT_AMBULATORY_CARE_PROVIDER_SITE_OTHER): Payer: Medicare Other

## 2015-03-06 DIAGNOSIS — E038 Other specified hypothyroidism: Secondary | ICD-10-CM | POA: Diagnosis not present

## 2015-03-06 LAB — TSH: TSH: 4.19 u[IU]/mL (ref 0.350–4.500)

## 2015-05-12 ENCOUNTER — Ambulatory Visit (INDEPENDENT_AMBULATORY_CARE_PROVIDER_SITE_OTHER): Payer: Medicare Other | Admitting: Internal Medicine

## 2015-05-12 ENCOUNTER — Encounter: Payer: Self-pay | Admitting: Internal Medicine

## 2015-05-12 ENCOUNTER — Other Ambulatory Visit: Payer: Self-pay | Admitting: Internal Medicine

## 2015-05-12 VITALS — BP 120/74 | HR 77 | Temp 97.7°F | Resp 16 | Ht 67.0 in | Wt 159.2 lb

## 2015-05-12 DIAGNOSIS — E782 Mixed hyperlipidemia: Secondary | ICD-10-CM

## 2015-05-12 DIAGNOSIS — I10 Essential (primary) hypertension: Secondary | ICD-10-CM

## 2015-05-12 DIAGNOSIS — Z79899 Other long term (current) drug therapy: Secondary | ICD-10-CM | POA: Diagnosis not present

## 2015-05-12 DIAGNOSIS — E559 Vitamin D deficiency, unspecified: Secondary | ICD-10-CM | POA: Diagnosis not present

## 2015-05-12 DIAGNOSIS — E039 Hypothyroidism, unspecified: Secondary | ICD-10-CM | POA: Diagnosis not present

## 2015-05-12 DIAGNOSIS — R7303 Prediabetes: Secondary | ICD-10-CM | POA: Diagnosis not present

## 2015-05-12 LAB — CBC WITH DIFFERENTIAL/PLATELET
Basophils Absolute: 0 10*3/uL (ref 0.0–0.1)
Basophils Relative: 1 % (ref 0–1)
EOS ABS: 0.1 10*3/uL (ref 0.0–0.7)
EOS PCT: 2 % (ref 0–5)
HEMATOCRIT: 38.2 % — AB (ref 39.0–52.0)
Hemoglobin: 13 g/dL (ref 13.0–17.0)
LYMPHS PCT: 36 % (ref 12–46)
Lymphs Abs: 1.4 10*3/uL (ref 0.7–4.0)
MCH: 31.8 pg (ref 26.0–34.0)
MCHC: 34 g/dL (ref 30.0–36.0)
MCV: 93.4 fL (ref 78.0–100.0)
MONO ABS: 0.6 10*3/uL (ref 0.1–1.0)
MPV: 10.6 fL (ref 8.6–12.4)
Monocytes Relative: 15 % — ABNORMAL HIGH (ref 3–12)
Neutro Abs: 1.8 10*3/uL (ref 1.7–7.7)
Neutrophils Relative %: 46 % (ref 43–77)
PLATELETS: 264 10*3/uL (ref 150–400)
RBC: 4.09 MIL/uL — AB (ref 4.22–5.81)
RDW: 13.9 % (ref 11.5–15.5)
WBC: 3.9 10*3/uL — AB (ref 4.0–10.5)

## 2015-05-12 LAB — LIPID PANEL
CHOLESTEROL: 197 mg/dL (ref 125–200)
HDL: 88 mg/dL (ref >=40)
LDL Cholesterol: 72 mg/dL (ref <130)
TRIGLYCERIDES: 187 mg/dL — AB (ref <150)
Total CHOL/HDL Ratio: 2.2 Ratio (ref <=5.0)
VLDL: 37 mg/dL — ABNORMAL HIGH (ref <30)

## 2015-05-12 LAB — HEPATIC FUNCTION PANEL
ALBUMIN: 3.3 g/dL — AB (ref 3.6–5.1)
ALT: 25 U/L (ref 9–46)
AST: 45 U/L — ABNORMAL HIGH (ref 10–35)
Alkaline Phosphatase: 74 U/L (ref 40–115)
BILIRUBIN TOTAL: 0.6 mg/dL (ref 0.2–1.2)
Bilirubin, Direct: 0.1 mg/dL (ref <=0.2)
Indirect Bilirubin: 0.5 mg/dL (ref 0.2–1.2)
TOTAL PROTEIN: 5.4 g/dL — AB (ref 6.1–8.1)

## 2015-05-12 LAB — BASIC METABOLIC PANEL WITH GFR
BUN: 15 mg/dL (ref 7–25)
CALCIUM: 9.2 mg/dL (ref 8.6–10.3)
CO2: 25 mmol/L (ref 20–31)
Chloride: 104 mmol/L (ref 98–110)
Creat: 0.82 mg/dL (ref 0.70–1.18)
GFR, EST NON AFRICAN AMERICAN: 88 mL/min (ref >=60)
GLUCOSE: 114 mg/dL — AB (ref 65–99)
Potassium: 4.6 mmol/L (ref 3.5–5.3)
Sodium: 138 mmol/L (ref 135–146)

## 2015-05-12 LAB — MAGNESIUM: Magnesium: 1.8 mg/dL (ref 1.5–2.5)

## 2015-05-12 LAB — TSH: TSH: 5.03 mIU/L — ABNORMAL HIGH (ref 0.40–4.50)

## 2015-05-12 NOTE — Patient Instructions (Signed)

## 2015-05-12 NOTE — Progress Notes (Signed)
Patient ID: Gregory Galvan , male   DOB: [birth year], 75 y.o.   MRN: [id number]   This very nice 75 y.o. Single WM presents for 6 month follow up with Hypertension, Hyperlipidemia, Pre-Diabetes and Vitamin D Deficiency. Patient als has remote hx/o Fibromalgia.    Patient is treated for HTN since circa 1989 & BP has been controlled at home. Today's BP: 120/74 mmHg. Patient has had no complaints of any cardiac type chest pain, palpitations, dyspnea/orthopnea/PND, dizziness, claudication, or dependent edema.   Hyperlipidemia is controlled with diet & meds. Patient denies myalgias or other med SE's. Last Lipids were at goal with Cholesterol 164; HDL 90; LDL 58; Triglycerides 78 on 02/04/2015.    Also, the patient has history of PreDiabetes since Jan 2014 with A1c 5.8% and has had no symptoms of reactive hypoglycemia, diabetic polys, paresthesias or visual blurring.  Last A1c was 5.3% on 12/21/201.   Further, the patient also has history of Vitamin D Deficiency of "24" in 2008 and supplements vitamin D without any suspected side-effects. Last vitamin D was  89 on    11/05/2014.  Medication Sig  . VITAMIN D 1000 UNITS Take 8,000 Units by mouth daily.  . Labetalol 200 MG tablet Take 1/2 to 1 tablet 2 x day as recommended for BP  . XALATAN) 0.005 % ophth soln at bedtime.   [name] levothyroxine  175 MCG  take 1 and 1/2 tablet by mouth once daily or as directed  . lisinopril-hctz 20-25 MG take 1 tablet by mouth once daily  . meloxicam (MOBIC) 15 MG  Take 1/2 to 1 tab daily with food for pain and inflammation.  [name] PARoxetine (PAXIL) 20 MG take 1 tablet by mouth once daily   Allergies  Allergen Reactions  . Penicillins     REACTION: rash,swelling  . 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 Sulfonamide Derivatives     REACTION: itching,swelling   PMHx:   Past Medical History  Diagnosis Date  . Hypertension   . Hyperlipidemia   . Thyroid disease   . Hypogonadism,  male   . Prostatitis   . BPH (benign prostatic hyperplasia)   . Pre-diabetes    Immunization History  Administered Date(s) Administered  . DT 02/04/2015  . DTaP 01/15/2004  . Influenza, High Dose Seasonal PF 12/26/2013, 11/05/2014  . Pneumococcal Polysaccharide-23 02/26/2007   Past Surgical History  Procedure Laterality Date  . Appendectomy     FHx:    Reviewed / unchanged  SHx:    Reviewed / unchanged  Systems Review:  Constitutional: Denies fever, chills, wt changes, headaches, insomnia, fatigue, night sweats, change in appetite. Eyes: Denies redness, blurred vision, diplopia, discharge, itchy, watery eyes.  ENT: Denies discharge, congestion, post nasal drip, epistaxis, sore throat, earache, hearing loss, dental pain, tinnitus, vertigo, sinus pain, snoring.  CV: Denies chest pain, palpitations, irregular heartbeat, syncope, dyspnea, diaphoresis, orthopnea, PND, claudication or edema. Respiratory: denies cough, dyspnea, DOE, pleurisy, hoarseness, laryngitis, wheezing.  Gastrointestinal: Denies dysphagia, odynophagia, heartburn, reflux, water brash, abdominal pain or cramps, nausea, vomiting, bloating, diarrhea, constipation, hematemesis, melena, hematochezia  or hemorrhoids. Genitourinary: Denies dysuria, frequency, urgency, nocturia, hesitancy, discharge, hematuria or flank pain. Musculoskeletal: Denies arthralgias, myalgias, stiffness, jt. swelling, pain, limping or strain/sprain.  Skin: Denies pruritus, rash, hives, warts, acne, eczema or change in skin lesion(s). Neuro: No weakness, tremor, incoordination, spasms, paresthesia or pain. Psychiatric: Denies confusion, memory loss or sensory loss. Endo: Denies change in weight, skin or hair  change.  Heme/Lymph: No excessive bleeding, bruising or enlarged lymph nodes.  Physical Exam  BP 120/74 mmHg  Pulse 77  Temp(Src) 97.7 F (36.5 C) (Temporal)  Resp 16  Ht 5\' 7"  (1.702 m)  Wt 159 lb 3.2 oz (72.213 kg)  BMI 24.93 kg/m2   SpO2 96%  Appears well nourished and in no distress. Eyes: PERRLA, EOMs, conjunctiva no swelling or erythema. Sinuses: No frontal/maxillary tenderness ENT/Mouth: EAC's clear, TM's nl w/o erythema, bulging. Nares clear w/o erythema, swelling, exudates. Oropharynx clear without erythema or exudates. Oral hygiene is good. Tongue normal, non obstructing. Hearing intact.  Neck: Supple. Thyroid nl. Car 2+/2+ without bruits, nodes or JVD. Chest: Respirations nl with BS clear & equal w/o rales, rhonchi, wheezing or stridor.  Cor: Heart sounds normal w/ regular rate and rhythm without sig. murmurs, gallops, clicks, or rubs. Peripheral pulses normal and equal  without edema.  Abdomen: Soft & bowel sounds normal. Non-tender w/o guarding, rebound, hernias, masses, or organomegaly.  Lymphatics: Unremarkable.  Musculoskeletal: Full ROM all peripheral extremities, joint stability, 5/5 strength, and normal gait.  Skin: Warm, dry without exposed rashes, lesions or ecchymosis apparent.  Neuro: Cranial nerves intact, reflexes equal bilaterally. Sensory-motor testing grossly intact. Tendon reflexes grossly intact.  Pysch: Alert & oriented x 3.  Insight and judgement nl & appropriate. No ideations.  Assessment and Plan:  1. Essential hypertension  - TSH  2. Hyperlipidemia  - Lipid panel - TSH  3. Prediabetes  - Hemoglobin A1c - Insulin, random  4. Vitamin D deficiency  - VITAMIN D 25 Hydroxy   5. Hypothyroidism   6. Medication management  - CBC with Differential/Platelet - BASIC METABOLIC PANEL WITH GFR - Hepatic function panel - Magnesium   Recommended regular exercise, BP monitoring, weight control, and discussed med and SE's. Recommended labs to assess and monitor clinical status. Further disposition pending results of labs. Over 30 minutes of exam, counseling, chart review was performed

## 2015-05-13 LAB — HEMOGLOBIN A1C
Hgb A1c MFr Bld: 5.2 % (ref <5.7)
MEAN PLASMA GLUCOSE: 103 mg/dL

## 2015-05-13 LAB — INSULIN, RANDOM: INSULIN: 9.1 u[IU]/mL (ref 2.0–19.6)

## 2015-05-13 LAB — VITAMIN D 25 HYDROXY (VIT D DEFICIENCY, FRACTURES): VIT D 25 HYDROXY: 87 ng/mL (ref 30–100)

## 2015-05-13 NOTE — Progress Notes (Signed)
Quick Note:  - Vit D 87 - Excellent   - A1c 5.2% - Normal - No Diabetes  - Thyroid borderline low - be sure to take on an empty stomach with only 1/2 -1 glass of water for 30 minutes  - Mag low - recc take 500 mg tab 2 x /daily   - All else - CBC - Kidneys - & Electrolytes - all Nl/OK  ______

## 2015-08-12 ENCOUNTER — Encounter: Payer: Self-pay | Admitting: Physician Assistant

## 2015-08-12 ENCOUNTER — Ambulatory Visit (INDEPENDENT_AMBULATORY_CARE_PROVIDER_SITE_OTHER): Payer: Medicare Other | Admitting: Physician Assistant

## 2015-08-12 VITALS — BP 120/66 | HR 82 | Temp 97.7°F | Resp 14 | Ht 67.0 in | Wt 160.8 lb

## 2015-08-12 DIAGNOSIS — E559 Vitamin D deficiency, unspecified: Secondary | ICD-10-CM

## 2015-08-12 DIAGNOSIS — Z6824 Body mass index (BMI) 24.0-24.9, adult: Secondary | ICD-10-CM

## 2015-08-12 DIAGNOSIS — F325 Major depressive disorder, single episode, in full remission: Secondary | ICD-10-CM

## 2015-08-12 DIAGNOSIS — Z79899 Other long term (current) drug therapy: Secondary | ICD-10-CM

## 2015-08-12 DIAGNOSIS — R7303 Prediabetes: Secondary | ICD-10-CM

## 2015-08-12 DIAGNOSIS — Z0001 Encounter for general adult medical examination with abnormal findings: Secondary | ICD-10-CM | POA: Diagnosis not present

## 2015-08-12 DIAGNOSIS — I10 Essential (primary) hypertension: Secondary | ICD-10-CM | POA: Diagnosis not present

## 2015-08-12 DIAGNOSIS — E782 Mixed hyperlipidemia: Secondary | ICD-10-CM | POA: Diagnosis not present

## 2015-08-12 DIAGNOSIS — E039 Hypothyroidism, unspecified: Secondary | ICD-10-CM

## 2015-08-12 DIAGNOSIS — H409 Unspecified glaucoma: Secondary | ICD-10-CM | POA: Diagnosis not present

## 2015-08-12 DIAGNOSIS — R51 Headache: Secondary | ICD-10-CM

## 2015-08-12 DIAGNOSIS — M790 Rheumatism, unspecified: Secondary | ICD-10-CM | POA: Diagnosis not present

## 2015-08-12 DIAGNOSIS — R519 Headache, unspecified: Secondary | ICD-10-CM

## 2015-08-12 DIAGNOSIS — M797 Fibromyalgia: Secondary | ICD-10-CM

## 2015-08-12 LAB — CBC WITH DIFFERENTIAL/PLATELET
Basophils Absolute: 41 cells/uL (ref 0–200)
Basophils Relative: 1 %
EOS PCT: 3 %
Eosinophils Absolute: 123 cells/uL (ref 15–500)
HEMATOCRIT: 39 % (ref 38.5–50.0)
Hemoglobin: 13.3 g/dL (ref 13.2–17.1)
LYMPHS ABS: 1476 {cells}/uL (ref 850–3900)
Lymphocytes Relative: 36 %
MCH: 32.2 pg (ref 27.0–33.0)
MCHC: 34.1 g/dL (ref 32.0–36.0)
MCV: 94.4 fL (ref 80.0–100.0)
MONOS PCT: 13 %
MPV: 10.3 fL (ref 7.5–12.5)
Monocytes Absolute: 533 cells/uL (ref 200–950)
NEUTROS ABS: 1927 {cells}/uL (ref 1500–7800)
Neutrophils Relative %: 47 %
Platelets: 244 10*3/uL (ref 140–400)
RBC: 4.13 MIL/uL — ABNORMAL LOW (ref 4.20–5.80)
RDW: 13.3 % (ref 11.0–15.0)
WBC: 4.1 10*3/uL (ref 3.8–10.8)

## 2015-08-12 NOTE — Patient Instructions (Signed)
Trigeminal Neuralgia Trigeminal neuralgia is a nerve disorder that causes attacks of severe facial pain. The attacks last from a few seconds to several minutes. They can happen for days, weeks, or months and then go away for months or years. Trigeminal neuralgia is also called tic douloureux. CAUSES This condition is caused by damage to a nerve in the face that is called the trigeminal nerve. An attack can be triggered by:  Talking.  Chewing.  Putting on makeup.  Washing your face.  Shaving your face.  Brushing your teeth.  Touching your face. RISK FACTORS This condition is more likely to develop in:  Women.  People who are 41 years of age or older. SYMPTOMS The main symptom of this condition is pain in the jaw, lips, eyes, nose, scalp, forehead, and face. The pain may be intense, stabbing, electric, or shock-like or pins and needle feelings. DIAGNOSIS This condition is diagnosed with a physical exam. A CT scan or MRI may be done to rule out other conditions that can cause facial pain. TREATMENT This condition may be treated with:  Avoiding the things that trigger your attacks.  Pain medicine.  Surgery. This may be done in severe cases if other medical treatment does not provide relief. HOME CARE INSTRUCTIONS  Take over-the-counter and prescription medicines only as told by your health care provider.  If you wish to get pregnant, talk with your health care provider before you start trying to get pregnant.  Avoid the things that trigger your attacks. It may help to:  Chew on the unaffected side of your mouth.  Avoid touching your face.  Avoid blasts of hot or cold air. SEEK MEDICAL CARE IF:  Your pain medicine is not helping.  You develop new, unexplained symptoms, such as:  Double vision.  Facial weakness.  Changes in hearing or balance.  You become pregnant. SEEK IMMEDIATE MEDICAL CARE IF:  Your pain is unbearable, and your pain medicine does not  help.   This information is not intended to replace advice given to you by your health care provider. Make sure you discuss any questions you have with your health care provider.   Document Released: 01/29/2000 Document Revised: 10/22/2014 Document Reviewed: 05/26/2014 Elsevier Interactive Patient Education [employer].

## 2015-08-12 NOTE — Progress Notes (Signed)
MEDICARE ANNUAL WELLNESS VISIT AND FOLLOW UP Assessment:   1. Essential hypertension - continue medications, DASH diet, exercise and monitor at home. Call if greater than 130/80.  - CBC with Differential/Platelet - BASIC METABOLIC PANEL WITH GFR - Hepatic function panel  2. Prediabetes Discussed general issues about diabetes pathophysiology and management., Educational material distributed., Suggested low cholesterol diet., Encouraged aerobic exercise., Discussed foot care., Reminded to get yearly retinal exam. -Cut back on wine/stop - Hemoglobin A1c - Insulin, fasting  3. Hyperlipidemia -continue medications, check lipids, decrease fatty foods, increase activity.  - Lipid panel  4. Hypothyroidism, unspecified hypothyroidism type Hypothyroidism-check TSH level, continue medications the same, reminded to take on an empty stomach 30-41mns before food.  - TSH  5. Medication management - Magnesium  6. Vitamin D deficiency - Vit D  25 hydroxy (rtn osteoporosis monitoring)  7. Fibromyalgia Continue paxil and meloxicam  8. Depression, major, in remission Continue paxil  9. Glaucoma (increased eye pressure) Continue follow up Gregory Galvan 10. Left sided temple pain Sounds like possible TN, check ESR, add allergy pill, follow up dentist but no abscess Declines medications at this time, avoid touching it, normal neuro   Over 30 minutes of exam, counseling, chart review, and critical decision making was performed  Plan:   During the course of the visit the patient was educated and counseled about appropriate screening and preventive services including:    Pneumococcal vaccine   Influenza vaccine  Td vaccine  Screening electrocardiogram  Colorectal cancer screening  Diabetes screening  Glaucoma screening  Nutrition counseling     Subjective:  Gregory FUKUDAis a 75y.o. male who presents for Medicare Annual Wellness Visit and 3 month follow up for HTN,  hyperlipidemia, prediabetes, and vitamin D Def.  Date of last medicare wellness visit was is unknown.  His blood pressure has been controlled at home, today their BP is BP: 120/66 mmHg He does not workout regular but occ walks. He denies chest pain, shortness of breath, dizziness.  He states he has been having some abnormal sensations on the left temples/side of his head, feels like pins and needles.  He is not on cholesterol medication and denies myalgias. His cholesterol is at goal. The cholesterol last visit was:   Lab Results  Component Value Date   CHOL 197 05/12/2015   HDL 88 05/12/2015   LDLCALC 72 05/12/2015   TRIG 187* 05/12/2015   CHOLHDL 2.2 05/12/2015   He has been working on diet and exercise for prediabetes, and denies paresthesia of the feet, polydipsia, polyuria and visual disturbances. Last A1C in the office was:  Lab Results  Component Value Date   HGBA1C 5.2 05/12/2015   He is on thyroid medication. His medication was not changed last visit.   Lab Results  Component Value Date   TSH 5.03* 05/12/2015   He has a history of depression/FM and is on paxil. He has lower back pain, and complains of left arm tingling in the morning. Denies weakness, pain down his legs/arms.  Patient is on Vitamin D supplement.   Lab Results  Component Value Date   VD25OH 87 05/12/2015      Medication Review: Current Outpatient Prescriptions on File Prior to Visit  Medication Sig Dispense Refill  . Cholecalciferol (VITAMIN D-3) 1000 UNITS CAPS Take 8,000 Units by mouth daily.    .Marland Kitchenlabetalol (NORMODYNE) 200 MG tablet Take 1/2 to 1 tablet 2 x day as recommended for BP 90 tablet 99  .  latanoprost (XALATAN) 0.005 % ophthalmic solution at bedtime.     [name] levothyroxine (SYNTHROID, LEVOTHROID) 175 MCG tablet take 1 and 1/2 tablet by mouth once daily or as directed 135 tablet 99  . lisinopril-hydrochlorothiazide (PRINZIDE,ZESTORETIC) 20-25 MG per tablet take 1 tablet by mouth once daily 90  tablet 1  . meloxicam (MOBIC) 15 MG tablet Take 1/2 to 1 tab daily with food for pain and inflammation. 90 tablet 0  . PARoxetine (PAXIL) 20 MG tablet take 1 tablet by mouth once daily 90 tablet 1   No current facility-administered medications on file prior to visit.    Current Problems (verified) Patient Active Problem List   Diagnosis Date Noted  . Body mass index (BMI) of 24.0-24.9 in adult 11/05/2014  . Depression, major, in remission ([place]) 07/23/2014  . Glaucoma (increased eye pressure) 07/23/2014  . Medication management 09/19/2013  . Essential hypertension 02/18/2013  . Hypothyroidism 02/18/2013  . Hyperlipidemia 02/18/2013  . Fibromyalgia 02/18/2013  . Vitamin D deficiency 02/18/2013  . Prediabetes 02/18/2013    Screening Tests Immunization History  Administered Date(s) Administered  . DT 02/04/2015  . DTaP 01/15/2004  . Influenza, High Dose Seasonal PF 12/26/2013, 11/05/2014  . Pneumococcal Polysaccharide-23 02/26/2007    Preventative care: Last colonoscopy: 2010 due 2015 Dr. Deatra Ina  Prior vaccinations: TD or Tdap: 2016 Influenza: 2016 Pneumococcal: 2009 Prevnar13: DUE Shingles/Zostavax: declines  Names of Other Physician/Practitioners you currently use: 1. Gregory Galvan here for primary care 2. Dr. Delman Galvan, eye doctor, last visit 05/2015 3. Dr. Angus Galvan, dentist, last visit 2016 Patient Care Team: Gregory Pinto, MD as PCP - General (Internal Galvan) Gregory Castle, MD as Consulting Physician (Gastroenterology)  Past Surgical History  Procedure Laterality Date  . Appendectomy     Family History  Problem Relation Age of Onset  . Cancer Mother     breast  . Alzheimer's disease Mother   . Hypertension Father   . Heart disease Father   . Lymphoma Father   . Hypertension Brother   . Heart disease Brother   . Heart disease Brother   . Hypertension Brother   . Diabetes Brother    Social History  Substance Use  Topics  . Smoking status: Former Smoker    Quit date: 12/22/2013  . Smokeless tobacco: None     Comment: occasional cigar  . Alcohol Use: 16.8 oz/week    28 Standard drinks or equivalent per week     Comment: drinks wine    MEDICARE WELLNESS OBJECTIVES: Tobacco use: He does not smoke.  Patient is a former smoker. Alcohol Current alcohol use: > 5 glasses of wine per week(s) Caffeine Current caffeine use: coffee 1 /day Osteoporosis: dietary calcium and/or vitamin D deficiency, History of fracture in the past year: no Diet: in general, a "healthy" diet   Physical activity: ADLs and sendentary work Depression/mood screen:   Depression screen [facility] 2/9 08/12/2015  Decreased Interest 0  Down, Depressed, Hopeless 0  PHQ - 2 Score 0   Hearing: normal Visual acuity: normal,  does not perform annual eye exam  ADLs:  In your present state of health, do you have any difficulty performing the following activities: 08/12/2015 05/12/2015  Hearing? N N  Vision? N N  Difficulty concentrating or making decisions? N N  Walking or climbing stairs? N N  Dressing or bathing? N N  Doing errands, shopping? N N    Fall risk: Low Risk Cognitive Testing  Alert? Yes  Normal Appearance?Yes  Oriented to person? Yes  Place? Yes   Time? Yes  Recall of three objects?  Yes  Can perform simple calculations? Yes  Displays appropriate judgment?Yes  Can read the correct time from a watch face?Yes  EOL planning: Does patient have an advance directive?: No Would patient like information on creating an advanced directive?: No - patient declined information   Review of Systems  Constitutional: Negative.   HENT: Positive for congestion. Negative for ear discharge, ear pain, hearing loss, nosebleeds, sore throat and tinnitus.   Eyes: Negative.  Negative for blurred vision.  Respiratory: Negative.  Negative for shortness of breath and stridor.   Cardiovascular: Negative for chest pain.  Gastrointestinal:  Negative.  Negative for abdominal pain, diarrhea and constipation.  Genitourinary: Negative.   Musculoskeletal: Positive for back pain and neck pain. Negative for myalgias, joint pain and falls.  Skin: Negative.   Neurological: Positive for headaches (left sided, tingling).  Psychiatric/Behavioral: Negative.  Negative for depression, suicidal ideas and memory loss. The patient does not have insomnia.   All other systems reviewed and are negative.    Objective:   Blood pressure 120/66, pulse 82, temperature 97.7 F (36.5 C), temperature source Temporal, resp. rate 14, height 5' 7" (1.702 m), weight 160 lb 12.8 oz (72.938 kg), SpO2 96 %. Body mass index is 25.18 kg/(m^2).  General appearance: alert, no distress, WD/WN, male HEENT: normocephalic, slightly tender left temples , sclerae anicteric, TMs pearly, nares patent, no discharge or erythema, pharynx normal Oral cavity: MMM, no lesions Neck: supple, no lymphadenopathy, no thyromegaly, no masses Heart: RRR, normal S1, S2, no murmurs Lungs: CTA bilaterally, no wheezes, rhonchi, or rales Abdomen: +bs, soft, non tender, non distended, no masses, no hepatomegaly, no splenomegaly Musculoskeletal: nontender, no swelling, no obvious deformity Extremities: no edema, no cyanosis, no clubbing Pulses: 2+ symmetric, upper and lower extremities, normal cap refill Neurological: alert, oriented x 3, CN2-12 intact, strength normal upper extremities and lower extremities, sensation normal throughout, DTRs 2+ throughout, no cerebellar signs, gait normal Psychiatric: normal affect, behavior normal, pleasant   Medicare Attestation I have personally reviewed: The patient's medical and social history Their use of alcohol, tobacco or illicit drugs Their current medications and supplements The patient's functional ability including ADLs,fall risks, home safety risks, cognitive, and hearing and visual impairment Diet and physical activities Evidence for  depression or mood disorders  The patient's weight, height, BMI, and visual acuity have been recorded in the chart.  I have made referrals, counseling, and provided education to the patient based on review of the above and I have provided the patient with a written personalized care plan for preventive services.     Vicie Mutters, PA-C   08/12/2015

## 2015-08-13 LAB — BASIC METABOLIC PANEL WITH GFR
BUN: 19 mg/dL (ref 7–25)
CALCIUM: 9.2 mg/dL (ref 8.6–10.3)
CO2: 23 mmol/L (ref 20–31)
Chloride: 103 mmol/L (ref 98–110)
Creat: 1.21 mg/dL — ABNORMAL HIGH (ref 0.70–1.18)
GFR, EST NON AFRICAN AMERICAN: 59 mL/min — AB (ref >=60)
GFR, Est African American: 68 mL/min (ref >=60)
GLUCOSE: 153 mg/dL — AB (ref 65–99)
Potassium: 4.3 mmol/L (ref 3.5–5.3)
Sodium: 135 mmol/L (ref 135–146)

## 2015-08-13 LAB — LIPID PANEL
CHOLESTEROL: 207 mg/dL — AB (ref 125–200)
HDL: 89 mg/dL (ref >=40)
LDL CALC: 78 mg/dL (ref <130)
Total CHOL/HDL Ratio: 2.3 Ratio (ref <=5.0)
Triglycerides: 200 mg/dL — ABNORMAL HIGH (ref <150)
VLDL: 40 mg/dL — AB (ref <30)

## 2015-08-13 LAB — HEPATIC FUNCTION PANEL
ALK PHOS: 76 U/L (ref 40–115)
ALT: 27 U/L (ref 9–46)
AST: 48 U/L — AB (ref 10–35)
Albumin: 3.3 g/dL — ABNORMAL LOW (ref 3.6–5.1)
BILIRUBIN INDIRECT: 0.3 mg/dL (ref 0.2–1.2)
Bilirubin, Direct: 0.1 mg/dL (ref <=0.2)
TOTAL PROTEIN: 5.1 g/dL — AB (ref 6.1–8.1)
Total Bilirubin: 0.4 mg/dL (ref 0.2–1.2)

## 2015-08-13 LAB — HEMOGLOBIN A1C
HEMOGLOBIN A1C: 5.2 % (ref <5.7)
MEAN PLASMA GLUCOSE: 103 mg/dL

## 2015-08-13 LAB — TSH: TSH: 4.91 mIU/L — ABNORMAL HIGH (ref 0.40–4.50)

## 2015-08-13 LAB — SEDIMENTATION RATE: SED RATE: 5 mm/h (ref 0–20)

## 2015-08-15 ENCOUNTER — Other Ambulatory Visit: Payer: Self-pay | Admitting: Internal Medicine

## 2015-08-19 ENCOUNTER — Other Ambulatory Visit: Payer: Self-pay | Admitting: Internal Medicine

## 2015-08-19 DIAGNOSIS — R7309 Other abnormal glucose: Secondary | ICD-10-CM | POA: Insufficient documentation

## 2015-09-14 ENCOUNTER — Ambulatory Visit (INDEPENDENT_AMBULATORY_CARE_PROVIDER_SITE_OTHER): Payer: Medicare Other | Admitting: *Deleted

## 2015-09-14 DIAGNOSIS — Z79899 Other long term (current) drug therapy: Secondary | ICD-10-CM

## 2015-09-14 DIAGNOSIS — E039 Hypothyroidism, unspecified: Secondary | ICD-10-CM

## 2015-09-14 LAB — BASIC METABOLIC PANEL WITH GFR
BUN: 16 mg/dL (ref 7–25)
CALCIUM: 8.9 mg/dL (ref 8.6–10.3)
CO2: 25 mmol/L (ref 20–31)
Chloride: 105 mmol/L (ref 98–110)
Creat: 0.87 mg/dL (ref 0.70–1.18)
GFR, EST NON AFRICAN AMERICAN: 86 mL/min (ref >=60)
GFR, Est African American: >89 mL/min (ref >=60)
GLUCOSE: 110 mg/dL — AB (ref 65–99)
POTASSIUM: 4.6 mmol/L (ref 3.5–5.3)
SODIUM: 138 mmol/L (ref 135–146)

## 2015-09-14 LAB — TSH: TSH: 6.48 mIU/L — ABNORMAL HIGH (ref 0.40–4.50)

## 2015-09-14 NOTE — Progress Notes (Signed)
Patient here for a NV to check TSH and BMP.  He takes Levothyroxine 175 mcg 1 tablet daily, except 1.5 tablets on Sundays.  His BP is 118/74 and his weight is 160.0 lb.

## 2015-11-12 ENCOUNTER — Ambulatory Visit (INDEPENDENT_AMBULATORY_CARE_PROVIDER_SITE_OTHER): Payer: Medicare Other | Admitting: Internal Medicine

## 2015-11-12 VITALS — BP 112/84 | HR 72 | Temp 97.7°F | Resp 16 | Ht 68.0 in | Wt 163.6 lb

## 2015-11-12 DIAGNOSIS — E559 Vitamin D deficiency, unspecified: Secondary | ICD-10-CM

## 2015-11-12 DIAGNOSIS — I10 Essential (primary) hypertension: Secondary | ICD-10-CM

## 2015-11-12 DIAGNOSIS — Z79899 Other long term (current) drug therapy: Secondary | ICD-10-CM

## 2015-11-12 DIAGNOSIS — R7303 Prediabetes: Secondary | ICD-10-CM

## 2015-11-12 DIAGNOSIS — Z23 Encounter for immunization: Secondary | ICD-10-CM

## 2015-11-12 DIAGNOSIS — E782 Mixed hyperlipidemia: Secondary | ICD-10-CM | POA: Diagnosis not present

## 2015-11-12 DIAGNOSIS — E039 Hypothyroidism, unspecified: Secondary | ICD-10-CM

## 2015-11-12 LAB — BASIC METABOLIC PANEL WITH GFR
BUN: 17 mg/dL (ref 7–25)
CHLORIDE: 105 mmol/L (ref 98–110)
CO2: 24 mmol/L (ref 20–31)
CREATININE: 0.93 mg/dL (ref 0.70–1.18)
Calcium: 9.3 mg/dL (ref 8.6–10.3)
GFR, EST NON AFRICAN AMERICAN: 81 mL/min (ref >=60)
GFR, Est African American: >89 mL/min (ref >=60)
Glucose, Bld: 156 mg/dL — ABNORMAL HIGH (ref 65–99)
POTASSIUM: 4.7 mmol/L (ref 3.5–5.3)
SODIUM: 138 mmol/L (ref 135–146)

## 2015-11-12 LAB — CBC WITH DIFFERENTIAL/PLATELET
BASOS ABS: 0 {cells}/uL (ref 0–200)
Basophils Relative: 0 %
EOS ABS: 135 {cells}/uL (ref 15–500)
EOS PCT: 3 %
HCT: 39.7 % (ref 38.5–50.0)
HEMOGLOBIN: 13.7 g/dL (ref 13.2–17.1)
LYMPHS ABS: 1620 {cells}/uL (ref 850–3900)
Lymphocytes Relative: 36 %
MCH: 32.5 pg (ref 27.0–33.0)
MCHC: 34.5 g/dL (ref 32.0–36.0)
MCV: 94.1 fL (ref 80.0–100.0)
MPV: 10.8 fL (ref 7.5–12.5)
Monocytes Absolute: 585 cells/uL (ref 200–950)
Monocytes Relative: 13 %
NEUTROS PCT: 48 %
Neutro Abs: 2160 cells/uL (ref 1500–7800)
PLATELETS: 283 10*3/uL (ref 140–400)
RBC: 4.22 MIL/uL (ref 4.20–5.80)
RDW: 13.4 % (ref 11.0–15.0)
WBC: 4.5 10*3/uL (ref 3.8–10.8)

## 2015-11-12 LAB — HEPATIC FUNCTION PANEL
ALBUMIN: 3.2 g/dL — AB (ref 3.6–5.1)
ALT: 28 U/L (ref 9–46)
AST: 52 U/L — AB (ref 10–35)
Alkaline Phosphatase: 103 U/L (ref 40–115)
Bilirubin, Direct: 0.1 mg/dL (ref <=0.2)
Indirect Bilirubin: 0.3 mg/dL (ref 0.2–1.2)
TOTAL PROTEIN: 5 g/dL — AB (ref 6.1–8.1)
Total Bilirubin: 0.4 mg/dL (ref 0.2–1.2)

## 2015-11-12 LAB — LIPID PANEL
CHOL/HDL RATIO: 2.2 ratio (ref <=5.0)
CHOLESTEROL: 212 mg/dL — AB (ref 125–200)
HDL: 97 mg/dL (ref >=40)
LDL Cholesterol: 79 mg/dL (ref <130)
Triglycerides: 180 mg/dL — ABNORMAL HIGH (ref <150)
VLDL: 36 mg/dL — ABNORMAL HIGH (ref <30)

## 2015-11-12 LAB — TSH: TSH: 5.22 m[IU]/L — AB (ref 0.40–4.50)

## 2015-11-12 LAB — MAGNESIUM: MAGNESIUM: 1.9 mg/dL (ref 1.5–2.5)

## 2015-11-12 NOTE — Progress Notes (Signed)
[facility] Unk Pinto, M.D.        Uvaldo Bristle. Silverio Lay, P.A.-C       Starlyn Skeans, P.A.-C  [facility]                [address] [place], [place] [id number] Telephone [phone] Telefax [phone] ______________________________________________________________________     This very nice  SWM presents for 6 month follow up with Hypertension, Hyperlipidemia, Pre-Diabetes and Vitamin D Deficiency.      Patient is treated for HTN circa 1989 & BP has been controlled at home. Patient reports having lightheadedness and he stopped both his BP meds and monitoring showed BP's remaining normal. Today's BP is 112/84. Patient has had no complaints of any cardiac type chest pain, palpitations, dyspnea/orthopnea/PND, dizziness, claudication, or dependent edema.     Hyperlipidemia is controlled with diet. Last Lipids were at goal albeit elevated Trig's: Lab Results  Component Value Date   CHOL 207 (H) 08/12/2015   HDL 89 08/12/2015   LDLCALC 78 08/12/2015   TRIG 200 (H) 08/12/2015   CHOLHDL 2.3 08/12/2015      Also, the patient has history of PreDiabetes with A1c 5.8% in Jan 2014 and has had no symptoms of reactive hypoglycemia, diabetic polys, paresthesias or visual blurring.  Last A1c was at goal: Lab Results  Component Value Date   HGBA1C 5.2 08/12/2015      Also, patient has been on Thyroid replacement since the 1990's. Further, the patient also has history of Vitamin D Deficiency in 2008 of "24" and supplements vitamin D without any suspected side-effects. Last vitamin D was at goal: Lab Results  Component Value Date   VD25OH 87 05/12/2015   Current Outpatient Prescriptions on File Prior to Visit  Medication Sig  . Cholecalciferol (VITAMIN D-3) 1000 UNITS CAPS Take 8,000 Units by mouth daily.  [name] latanoprost (XALATAN) 0.005 % ophthalmic solution at bedtime.   [name] levothyroxine (SYNTHROID,  LEVOTHROID) 175 MCG tablet take 1 and 1/2 tablet by mouth once daily or as directed  . meloxicam (MOBIC) 15 MG tablet Take 1/2 to 1 tab daily with food for pain and inflammation.  [name] PARoxetine (PAXIL) 20 MG tablet take 1 tablet by mouth once daily  . labetalol (NORMODYNE) 200 MG tablet take 1/2-1 tablet by mouth twice a day for blood pressure (Patient not taking: Reported on 11/12/2015)  . lisinopril-hydrochlorothiazide (PRINZIDE,ZESTORETIC) 20-25 MG per tablet take 1 tablet by mouth once daily (Patient not taking: Reported on 11/12/2015)   No current facility-administered medications on file prior to visit.    Allergies  Allergen Reactions  . Penicillins     REACTION: rash,swelling  . 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 Sulfonamide Derivatives     REACTION: itching,swelling   PMHx:   Past Medical History:  Diagnosis Date  . BPH (benign prostatic hyperplasia)   . Hyperlipidemia   . Hypertension   . Hypogonadism, male   . Pre-diabetes   . Prostatitis   . Thyroid disease    Immunization History  Administered Date(s) Administered  . DT 02/04/2015  . DTaP 01/15/2004  . Influenza, High Dose Seasonal PF 12/26/2013, 11/05/2014  . Pneumococcal Polysaccharide-23 02/26/2007   Past Surgical History:  Procedure Laterality Date  . APPENDECTOMY     FHx:    Reviewed / unchanged  SHx:  Reviewed / unchanged  Systems Review:  Constitutional: Denies fever, chills, wt changes, headaches, insomnia, fatigue, night sweats, change in appetite. Eyes: Denies redness, blurred vision, diplopia, discharge, itchy, watery eyes.  ENT: Denies discharge, congestion, post nasal drip, epistaxis, sore throat, earache, hearing loss, dental pain, tinnitus, vertigo, sinus pain, snoring.  CV: Denies chest pain, palpitations, irregular heartbeat, syncope, dyspnea, diaphoresis, orthopnea, PND, claudication or edema. Respiratory: denies cough, dyspnea, DOE, pleurisy,  hoarseness, laryngitis, wheezing.  Gastrointestinal: Denies dysphagia, odynophagia, heartburn, reflux, water brash, abdominal pain or cramps, nausea, vomiting, bloating, diarrhea, constipation, hematemesis, melena, hematochezia  or hemorrhoids. Genitourinary: Denies dysuria, frequency, urgency, nocturia, hesitancy, discharge, hematuria or flank pain. Musculoskeletal: Denies arthralgias, myalgias, stiffness, jt. swelling, pain, limping or strain/sprain.  Skin: Denies pruritus, rash, hives, warts, acne, eczema or change in skin lesion(s). Neuro: No weakness, tremor, incoordination, spasms, paresthesia or pain. Psychiatric: Denies confusion, memory loss or sensory loss. Endo: Denies change in weight, skin or hair change.  Heme/Lymph: No excessive bleeding, bruising or enlarged lymph nodes.  Physical Exam BP 112/84   Pulse 72   Temp 97.7 F (36.5 C)   Resp 16   Ht 5\' 8"  (1.727 m)   Wt 163 lb 9.6 oz (74.2 kg)   BMI 24.88 kg/m   Appears well nourished and in no distress.  Eyes: PERRLA, EOMs, conjunctiva no swelling or erythema. Sinuses: No frontal/maxillary tenderness ENT/Mouth: EAC's clear, TM's nl w/o erythema, bulging. Nares clear w/o erythema, swelling, exudates. Oropharynx clear without erythema or exudates. Oral hygiene is good. Tongue normal, non obstructing. Hearing intact.  Neck: Supple. Thyroid nl. Car 2+/2+ without bruits, nodes or JVD. Chest: Respirations nl with BS clear & equal w/o rales, rhonchi, wheezing or stridor.  Cor: Heart sounds normal w/ regular rate and rhythm without sig. murmurs, gallops, clicks, or rubs. Peripheral pulses normal and equal  without edema.  Abdomen: Soft & bowel sounds normal. Non-tender w/o guarding, rebound, hernias, masses, or organomegaly.  Lymphatics: Unremarkable.  Musculoskeletal: Full ROM all peripheral extremities, joint stability, 5/5 strength, and normal gait.  Skin: Warm, dry without exposed rashes, lesions or ecchymosis apparent.   Neuro: Cranial nerves intact, reflexes equal bilaterally. Sensory-motor testing grossly intact. Tendon reflexes grossly intact.  Pysch: Alert & oriented x 3.  Insight and judgement nl & appropriate. No ideations.  Assessment and Plan:  1. Essential hypertension  - Continue monitor blood pressure at home. Continue DASH diet. Reminder to go to the ER if any CP, SOB, nausea, dizziness, severe HA, changes vision/speech, left arm numbness and tingling and jaw pain. - TSH  2. Hyperlipidemia  - Continue diet/meds, exercise,& lifestyle modifications. Continue monitor periodic cholesterol/liver & renal functions  - Lipid panel - TSH  3. Prediabetes  - Continue diet, exercise, lifestyle modifications. Monitor appropriate labs. - Hemoglobin A1c - Insulin, random  4. Vitamin D deficiency  - Continue supplementation. - VITAMIN D 25 Hydroxy   5. Hypothyroidism  - TSH  6. Medication management  - CBC with Differential/Platelet - BASIC METABOLIC PANEL WITH GFR - Hepatic function panel - Magnesium  7. Need for prophylactic vaccination and inoculation against influenza  - Flu vaccine HIGH DOSE PF (Fluzone High dose)       Recommended regular exercise, BP monitoring, weight control, and discussed med and SE's. Recommended labs to assess and monitor clinical status. Further disposition pending results of labs. Over 30 minutes of exam, counseling, chart review was performed

## 2015-11-12 NOTE — Patient Instructions (Signed)

## 2015-11-13 LAB — INSULIN, RANDOM: INSULIN: 13 u[IU]/mL (ref 2.0–19.6)

## 2015-11-13 LAB — HEMOGLOBIN A1C
HEMOGLOBIN A1C: 5 % (ref <5.7)
MEAN PLASMA GLUCOSE: 97 mg/dL

## 2015-11-13 LAB — VITAMIN D 25 HYDROXY (VIT D DEFICIENCY, FRACTURES): VIT D 25 HYDROXY: 61 ng/mL (ref 30–100)

## 2015-11-14 ENCOUNTER — Other Ambulatory Visit: Payer: Self-pay | Admitting: Internal Medicine

## 2015-11-14 DIAGNOSIS — I10 Essential (primary) hypertension: Secondary | ICD-10-CM

## 2015-11-14 DIAGNOSIS — E039 Hypothyroidism, unspecified: Secondary | ICD-10-CM

## 2015-11-15 ENCOUNTER — Encounter: Payer: Self-pay | Admitting: Internal Medicine

## 2015-11-26 ENCOUNTER — Other Ambulatory Visit: Payer: Self-pay | Admitting: Internal Medicine

## 2015-12-03 ENCOUNTER — Encounter: Payer: Self-pay | Admitting: Internal Medicine

## 2015-12-14 ENCOUNTER — Other Ambulatory Visit: Payer: Medicare Other

## 2015-12-14 DIAGNOSIS — E039 Hypothyroidism, unspecified: Secondary | ICD-10-CM

## 2015-12-14 LAB — TSH: TSH: 6.8 m[IU]/L — AB (ref 0.40–4.50)

## 2015-12-15 ENCOUNTER — Other Ambulatory Visit: Payer: Self-pay | Admitting: Internal Medicine

## 2015-12-15 DIAGNOSIS — E039 Hypothyroidism, unspecified: Secondary | ICD-10-CM

## 2015-12-16 ENCOUNTER — Telehealth: Payer: Self-pay | Admitting: *Deleted

## 2015-12-16 NOTE — Telephone Encounter (Signed)
The patient was called regarding his lab results and reported his BP has been elevated as high as 150/100.  The patient has been off his BP medications, since his BP has been lower.  Per Dr Melford Aase, restart the Labetolol and Lisinopril with HCTZ at 1/2 tablet daily, resume his Meloxicam 15 mg for hip and leg pain and schedule a 1 month follow up OV to evaluate his BP and TSH.

## 2016-01-18 ENCOUNTER — Encounter: Payer: Self-pay | Admitting: Internal Medicine

## 2016-01-18 ENCOUNTER — Ambulatory Visit (INDEPENDENT_AMBULATORY_CARE_PROVIDER_SITE_OTHER): Payer: Medicare Other | Admitting: Internal Medicine

## 2016-01-18 VITALS — BP 138/84 | HR 88 | Temp 97.7°F | Resp 16 | Ht 68.0 in | Wt 164.2 lb

## 2016-01-18 DIAGNOSIS — E038 Other specified hypothyroidism: Secondary | ICD-10-CM

## 2016-01-18 DIAGNOSIS — M5440 Lumbago with sciatica, unspecified side: Secondary | ICD-10-CM

## 2016-01-18 MED ORDER — PREDNISONE 20 MG PO TABS: ORAL_TABLET | ORAL | 1 refills | Status: DC

## 2016-01-18 NOTE — Progress Notes (Signed)
[facility]   Unk Pinto, M.D.    Uvaldo Bristle. Silverio Lay, P.A.-C      Starlyn Skeans, P.A.-C  [facility]                [address] [place], [place] [phone] Telephone [phone] Telefax [phone] Subjective:    Patient ID: Gregory Galvan, male    DOB: 10-Mar-1940, 75 y.o.   MRN: [id number]  HPI  This very nice 75 yo WM with HTN, HLD, PreDM and Vit D def has hx/o Hypothyroidism predating circa 1990 and has been on replacement therapy . Last 2 TSH values have been slightly and thus patient was advised slight dosage increases. Also he is c/o intermittent LBP and occasional R sciatica.   Medication Sig  . VITAMIN D 1000 UNITS Take 8,000 Units by mouth daily.  [name] labetalol  200 MG tablet take 1/2-1 tablet by mouth twice a day for blood pressure  . XALATAN  ophthalmic solution at bedtime.   [name] levothyroxine  175 MCG tablet TAKE &1/2 TAB 5x/wk and 1 TAB 2x/wk = 9.5 tabs/week   . lisinopril-hctz 20-25 MG  take 1 tablet by mouth once daily  . meloxicam (MOBIC) 15 MG Take 1/2 to 1 tab daily with food for pain and inflammation.  [name] PARoxetine (PAXIL) 20 MG take 1 tablet by mouth once daily   Allergies  Allergen Reactions  . Penicillins     REACTION: rash,swelling  . 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 Sulfonamide Derivatives     REACTION: itching,swelling   Review of Systems  10 point systems review negative except as above.    Objective:   Physical Exam  BP 138/84   Pulse 88   Temp 97.7 F (36.5 C)   Resp 16   Ht 5\' 8"  (1.727 m)   Wt 164 lb 3.2 oz (74.5 kg)   BMI 24.97 kg/m   HEENT - Eac's patent. TM's Nl. EOM's full. PERRLA. NasoOroPharynx clear. Neck - supple. Nl Thyroid. Carotids 2+ & No bruits, nodes, JVD Chest - Clear equal BS w/o Rales, rhonchi, wheezes. Cor - Nl HS. RRR w/o sig MGR. PP 1(+). No edema. Abd - No palpable organomegaly,  masses or tenderness. BS nl. MS- FROM w/o deformities. Muscle power, tone and bulk Nl. Gait Nl. Neg Fig 4 and SLR bilaterally.  Neuro - Sensory, motor and Cerebellar functions appear Nl w/o focal abnormalities.. Skin - exposed - Negative    Assessment & Plan:   1. Hypothyroidism  - discussed how to take the thyroxine correctly - every day, with water >30 minutes before breakfast, separated by >4 hours from acid reflux medications, calcium, iron, multivitamins.  - TSH  2. Low back pain with sciatica, sciatica laterality unspecified, unspecified back pain laterality, unspecified chronicity  - predniSONE (DELTASONE) 20 MG tablet; 1 tab 3 x day for 3 days, then 1 tab 2 x day for 3 days, then 1 tab 1 x day for 5 days  Dispense: 20 tablet; Refill: 1

## 2016-01-18 NOTE — Patient Instructions (Addendum)
How to take the LT4 correctly.   Need to take the thyroid hormone every day,  with water   >30 minutes before breakfast,  separated by >4 hours from   acid reflux medications, calcium, iron, multivitamins.    Chronic Back Pain When back pain lasts longer than 3 months, it is called chronic back pain.The cause of your back pain may not be known. Some common causes include:  Wear and tear (degenerative disease) of the bones, ligaments, or disks in your back.  Inflammation and stiffness in your back (arthritis). People who have chronic back pain often go through certain periods in which the pain is more intense (flare-ups). Many people can learn to manage the pain with home care. Follow these instructions at home: Pay attention to any changes in your symptoms. Take these actions to help with your pain: Activity  Avoid bending and activities that make the problem worse.  Do not sit or stand in one place for long periods of time.  Take brief periods of rest throughout the day. This will reduce your pain. Resting in a lying or standing position is usually better than sitting to rest.  When you are resting for longer periods, mix in some mild activity or stretching between periods of rest. This will help to prevent stiffness and pain.  Get regular exercise. Ask your health care provider what activities are safe for you.  Do not lift anything that is heavier than 10 lb (4.5 kg). Always use proper lifting technique, which includes:  Bending your knees.  Keeping the load close to your body.  Avoiding twisting. Managing pain  If directed, apply ice to the painful area. Your health care provider may recommend applying ice during the first 24-48 hours after a flare-up begins.  Put ice in a plastic bag.  Place a towel between your skin and the bag.  Leave the ice on for 20 minutes, 2-3 times per day.  After icing, apply heat to the affected area as often as told by your health care  provider. Use the heat source that your health care provider recommends, such as a moist heat pack or a heating pad.  Place a towel between your skin and the heat source.  Leave the heat on for 20-30 minutes.  Remove the heat if your skin turns bright red. This is especially important if you are unable to feel pain, heat, or cold. You may have a greater risk of getting burned.  Try soaking in a warm tub.  Take over-the-counter and prescription medicines only as told by your health care provider.  Keep all follow-up visits as told by your health care provider. This is important. Contact a health care provider if:  You have pain that is not relieved with rest or medicine. Get help right away if:  You have weakness or numbness in one or both of your legs or feet.  You have trouble controlling your bladder or your bowels.  You have nausea or vomiting.  You have pain in your abdomen.  You have shortness of breath or you faint.  Document Released: 03/10/2004 Document Revised: 06/11/2015 Document Reviewed: 07/21/2014 Elsevier Interactive Patient Education  2017 [employer].

## 2016-01-19 LAB — TSH: TSH: 4.19 m[IU]/L (ref 0.40–4.50)

## 2016-02-04 ENCOUNTER — Encounter: Payer: Self-pay | Admitting: Internal Medicine

## 2016-02-04 ENCOUNTER — Ambulatory Visit (INDEPENDENT_AMBULATORY_CARE_PROVIDER_SITE_OTHER): Payer: Medicare Other | Admitting: Internal Medicine

## 2016-02-04 VITALS — BP 138/76 | HR 78 | Temp 98.0°F | Resp 16 | Ht 66.5 in | Wt 162.0 lb

## 2016-02-04 DIAGNOSIS — Z79899 Other long term (current) drug therapy: Secondary | ICD-10-CM

## 2016-02-04 DIAGNOSIS — E349 Endocrine disorder, unspecified: Secondary | ICD-10-CM | POA: Diagnosis not present

## 2016-02-04 DIAGNOSIS — E782 Mixed hyperlipidemia: Secondary | ICD-10-CM | POA: Diagnosis not present

## 2016-02-04 DIAGNOSIS — E039 Hypothyroidism, unspecified: Secondary | ICD-10-CM

## 2016-02-04 DIAGNOSIS — I10 Essential (primary) hypertension: Secondary | ICD-10-CM

## 2016-02-04 DIAGNOSIS — R7303 Prediabetes: Secondary | ICD-10-CM | POA: Diagnosis not present

## 2016-02-04 DIAGNOSIS — E559 Vitamin D deficiency, unspecified: Secondary | ICD-10-CM

## 2016-02-04 DIAGNOSIS — Z125 Encounter for screening for malignant neoplasm of prostate: Secondary | ICD-10-CM

## 2016-02-04 LAB — CBC WITH DIFFERENTIAL/PLATELET
BASOS PCT: 0 %
Basophils Absolute: 0 cells/uL (ref 0–200)
EOS PCT: 2 %
Eosinophils Absolute: 100 cells/uL (ref 15–500)
HEMATOCRIT: 42.1 % (ref 38.5–50.0)
Hemoglobin: 14.2 g/dL (ref 13.2–17.1)
LYMPHS ABS: 1450 {cells}/uL (ref 850–3900)
LYMPHS PCT: 29 %
MCH: 32.1 pg (ref 27.0–33.0)
MCHC: 33.7 g/dL (ref 32.0–36.0)
MCV: 95 fL (ref 80.0–100.0)
MONO ABS: 550 {cells}/uL (ref 200–950)
MPV: 10.3 fL (ref 7.5–12.5)
Monocytes Relative: 11 %
NEUTROS PCT: 58 %
Neutro Abs: 2900 cells/uL (ref 1500–7800)
PLATELETS: 288 10*3/uL (ref 140–400)
RBC: 4.43 MIL/uL (ref 4.20–5.80)
RDW: 13.2 % (ref 11.0–15.0)
WBC: 5 10*3/uL (ref 3.8–10.8)

## 2016-02-04 LAB — TSH: TSH: 3.77 mIU/L (ref 0.40–4.50)

## 2016-02-04 LAB — HEMOGLOBIN A1C
Hgb A1c MFr Bld: 4.9 % (ref <5.7)
Mean Plasma Glucose: 94 mg/dL

## 2016-02-04 LAB — PSA: PSA: 0.5 ng/mL (ref <=4.0)

## 2016-02-04 MED ORDER — LEVOTHYROXINE SODIUM 175 MCG PO TABS: ORAL_TABLET | ORAL | 1 refills | Status: DC

## 2016-02-04 NOTE — Patient Instructions (Signed)
Please try adding flonase 2 sprays per nostril right before you go to bed to see if this will work together with the zyrtec to help your congestion.  Please use your netti pot to help rinse your nose out.  If this still doesn't work try taking the prednisone that you have at home.  Try adding in a gentle stretching routine for your legs and lower back as you wake up in the morning.

## 2016-02-04 NOTE — Progress Notes (Signed)
Complete Physical  Assessment and Plan:   1. Essential hypertension  - Urinalysis, Routine w reflex microscopic - Microalbumin / creatinine urine ratio - EKG 12-Lead - TSH  2. Hypothyroidism, unspecified type -TSH -cont levothyroxine  3. Mixed hyperlipidemia -cont diet and exercise -not on medications - Lipid panel  4. Prediabetes -cont diet and exercise - Hemoglobin A1c - Insulin, random  5. Vitamin D deficiency -cont Vit D - VITAMIN D 25 Hydroxy (Vit-D Deficiency, Fractures)  6. Medication management - CBC with Differential/Platelet - BASIC METABOLIC PANEL WITH GFR - Hepatic function panel - Magnesium  7. Testosterone deficiency -monitor -not on replacement - Testosterone  8. Screening for prostate cancer - PSA   Discussed med's effects and SE's. Screening labs and tests as requested with regular follow-up as recommended.  HPI Patient presents for a complete physical.   His blood pressure has been controlled at home, today their BP is BP: 138/76 He does workout. He denies chest pain, shortness of breath, dizziness.  He reports that he is trying to walk whenever he can.     He is on cholesterol medication and denies myalgias. His cholesterol is not at goal. The cholesterol last visit was:   Lab Results  Component Value Date   CHOL 212 (H) 11/12/2015   HDL 97 11/12/2015   LDLCALC 79 11/12/2015   TRIG 180 (H) 11/12/2015   CHOLHDL 2.2 11/12/2015    He has been working on diet and exercise for prediabetes, he is on bASA, he is on ACE/ARB and denies foot ulcerations, hyperglycemia, hypoglycemia , increased appetite, nausea, paresthesia of the feet, polydipsia, polyuria, visual disturbances, vomiting and weight loss. Last A1C in the office was:  Lab Results  Component Value Date   HGBA1C 5.0 11/12/2015    Patient is on Vitamin D supplement.   Lab Results  Component Value Date   VD25OH 61 11/12/2015      Last PSA was: Lab Results  Component Value  Date   PSA 0.59 11/05/2014  .  Denies BPH symptoms daytime frequency, double voiding, dysuria, hematuria, hesitancy, incontinence, intermittency, nocturia, sensation of incomplete bladder emptying, suprapubic pain, urgency or weak urinary stream.  Patient reports that he has been having some congestion.  He has been using zyrtec.  He feels slightly better but has some lingering congestion.  He also has some congestion.    Current Medications:  Current Outpatient Prescriptions on File Prior to Visit  Medication Sig Dispense Refill  . Cholecalciferol (VITAMIN D-3) 1000 UNITS CAPS Take 8,000 Units by mouth daily.    [name] labetalol (NORMODYNE) 200 MG tablet take 1/2-1 tablet by mouth twice a day for blood pressure 90 tablet 1  . latanoprost (XALATAN) 0.005 % ophthalmic solution at bedtime.     [name] levothyroxine (SYNTHROID, LEVOTHROID) 175 MCG tablet TAKE 1 AND 1/2 TABLETS BY MOUTH ONCE DAILY OR AS DIRECTED 135 tablet 1  . lisinopril-hydrochlorothiazide (PRINZIDE,ZESTORETIC) 20-25 MG per tablet take 1 tablet by mouth once daily 90 tablet 1  . meloxicam (MOBIC) 15 MG tablet Take 1/2 to 1 tab daily with food for pain and inflammation. 90 tablet 0  . PARoxetine (PAXIL) 20 MG tablet take 1 tablet by mouth once daily 90 tablet 1   No current facility-administered medications on file prior to visit.     Health Maintenance:  Immunization History  Administered Date(s) Administered  . DT 02/04/2015  . DTaP 01/15/2004  . Influenza, High Dose Seasonal PF 12/26/2013, 11/05/2014, 11/12/2015  . Pneumococcal Polysaccharide-23 02/26/2007  Tetanus: 2016 Pneumovax: 2009 Prevnar 13: due, but out of stock Flu vaccine: 2017 Colonoscopy: 2011 Eye Exam: Done 4 months ago Dentist: Due at the first of the year, Dr. Christene Slates  Patient Care Team: Unk Pinto, MD as PCP - General (Internal Medicine) Inda Castle, MD as Consulting Physician (Gastroenterology)  Allergies:  Allergies  Allergen Reactions   . Penicillins     REACTION: rash,swelling  . 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 Sulfonamide Derivatives     REACTION: itching,swelling    Medical History:  Past Medical History:  Diagnosis Date  . BPH (benign prostatic hyperplasia)   . Hyperlipidemia   . Hypertension   . Hypogonadism, male   . Pre-diabetes   . Prostatitis   . Thyroid disease     Surgical History:  Past Surgical History:  Procedure Laterality Date  . APPENDECTOMY      Family History:  Family History  Problem Relation Age of Onset  . Cancer Mother     breast  . Alzheimer's disease Mother   . Hypertension Father   . Heart disease Father   . Lymphoma Father   . Hypertension Brother   . Heart disease Brother   . Heart disease Brother   . Hypertension Brother   . Diabetes Brother     Social History:   Social History  Substance Use Topics  . Smoking status: Former Smoker    Quit date: 12/22/2013  . Smokeless tobacco: Not on file     Comment: occasional cigar  . Alcohol use 16.8 oz/week    28 Standard drinks or equivalent per week     Comment: drinks wine    Review of Systems:  Review of Systems  Constitutional: Negative for chills, fever and malaise/fatigue.  HENT: Negative for congestion, ear pain and sore throat.   Eyes: Negative.   Respiratory: Negative for cough, shortness of breath and wheezing.   Cardiovascular: Negative for chest pain, palpitations and leg swelling.  Gastrointestinal: Negative for abdominal pain, blood in stool, constipation, diarrhea, heartburn and melena.  Genitourinary: Negative.   Skin: Negative.   Neurological: Negative for dizziness, sensory change, loss of consciousness and headaches.  Psychiatric/Behavioral: Negative for depression. The patient is not nervous/anxious and does not have insomnia.     Physical Exam: Estimated body mass index is 25.76 kg/m as calculated from the following:   Height as of this  encounter: 5' 6.5" (1.689 m).   Weight as of this encounter: 162 lb (73.5 kg). BP 138/76   Pulse 78   Temp 98 F (36.7 C) (Temporal)   Resp 16   Ht 5' 6.5" (1.689 m)   Wt 162 lb (73.5 kg)   BMI 25.76 kg/m   General Appearance: Well nourished, in no apparent distress.  Eyes: PERRLA, EOMs, conjunctiva no swelling or erythema ENT/Mouth: Ear canals clear bilaterally with no erythema, swelling, discharge.  TMs normal bilaterally with no erythema, bulging, or retractions.  Oropharynx clear and moist with no exudate, swelling, or erythema.  Dentition normal.   Neck: Supple, thyroid normal. No bruits, JVD, cervical adenopathy Respiratory: Respiratory effort normal, BS equal bilaterally without rales, rhonchi, wheezing or stridor.  Cardio: RRR without murmurs, rubs or gallops. Brisk peripheral pulses without edema.  Chest: symmetric, with normal excursions Abdomen: Soft, nontender, no guarding, rebound, hernias, masses, or organomegaly. Musculoskeletal: Full ROM all peripheral extremities,5/5 strength, and normal gait.  Skin: Warm, dry without rashes, lesions, ecchymosis. Neuro: A&Ox3, Cranial nerves  intact, reflexes equal bilaterally. Normal muscle tone, no cerebellar symptoms. Sensation intact.  Psych: Normal affect, Insight and Judgment appropriate.   EKG: WNL no changes.  Over 40 minutes of exam, counseling, chart review and critical decision making was performed  Loma Sousa Forcucci 3:06 PM [facility]

## 2016-02-05 LAB — URINALYSIS, ROUTINE W REFLEX MICROSCOPIC
Bilirubin Urine: NEGATIVE
GLUCOSE, UA: NEGATIVE
HGB URINE DIPSTICK: NEGATIVE
KETONES UR: NEGATIVE
LEUKOCYTES UA: NEGATIVE
NITRITE: NEGATIVE
PH: 7 (ref 5.0–8.0)
Specific Gravity, Urine: 1.013 (ref 1.001–1.035)

## 2016-02-05 LAB — BASIC METABOLIC PANEL WITH GFR
BUN: 19 mg/dL (ref 7–25)
CALCIUM: 9.3 mg/dL (ref 8.6–10.3)
CO2: 26 mmol/L (ref 20–31)
Chloride: 103 mmol/L (ref 98–110)
Creat: 0.96 mg/dL (ref 0.70–1.18)
GFR, Est Non African American: 78 mL/min (ref >=60)
Glucose, Bld: 110 mg/dL — ABNORMAL HIGH (ref 65–99)
POTASSIUM: 4.4 mmol/L (ref 3.5–5.3)
SODIUM: 139 mmol/L (ref 135–146)

## 2016-02-05 LAB — URINALYSIS, MICROSCOPIC ONLY
BACTERIA UA: NONE SEEN [HPF]
CASTS: NONE SEEN [LPF]
CRYSTALS: NONE SEEN [HPF]
RBC / HPF: NONE SEEN RBC/HPF (ref <=2)
Squamous Epithelial / LPF: NONE SEEN [HPF] (ref <=5)
WBC UA: NONE SEEN WBC/HPF (ref <=5)
YEAST: NONE SEEN [HPF]

## 2016-02-05 LAB — HEPATIC FUNCTION PANEL
ALK PHOS: 108 U/L (ref 40–115)
ALT: 27 U/L (ref 9–46)
AST: 59 U/L — ABNORMAL HIGH (ref 10–35)
Albumin: 3.2 g/dL — ABNORMAL LOW (ref 3.6–5.1)
BILIRUBIN DIRECT: 0.1 mg/dL (ref <=0.2)
BILIRUBIN INDIRECT: 0.2 mg/dL (ref 0.2–1.2)
BILIRUBIN TOTAL: 0.3 mg/dL (ref 0.2–1.2)
TOTAL PROTEIN: 5.3 g/dL — AB (ref 6.1–8.1)

## 2016-02-05 LAB — MAGNESIUM: Magnesium: 1.9 mg/dL (ref 1.5–2.5)

## 2016-02-05 LAB — LIPID PANEL
CHOL/HDL RATIO: 2.4 ratio (ref <5.0)
CHOLESTEROL: 193 mg/dL (ref <200)
HDL: 81 mg/dL (ref >40)
LDL CALC: 73 mg/dL (ref <100)
TRIGLYCERIDES: 195 mg/dL — AB (ref <150)
VLDL: 39 mg/dL — AB (ref <30)

## 2016-02-05 LAB — INSULIN, RANDOM: INSULIN: 8.6 u[IU]/mL (ref 2.0–19.6)

## 2016-02-05 LAB — MICROALBUMIN / CREATININE URINE RATIO
Creatinine, Urine: 79 mg/dL (ref 20–370)
MICROALB UR: 165.9 mg/dL
Microalb Creat Ratio: 2100 mcg/mg creat — ABNORMAL HIGH (ref <30)

## 2016-02-05 LAB — TESTOSTERONE: TESTOSTERONE: 440 ng/dL (ref 250–827)

## 2016-02-05 LAB — VITAMIN D 25 HYDROXY (VIT D DEFICIENCY, FRACTURES): Vit D, 25-Hydroxy: 58 ng/mL (ref 30–100)

## 2016-02-10 NOTE — Progress Notes (Signed)
Pt aware of lab results & voiced understanding of those results.

## 2016-02-11 ENCOUNTER — Encounter: Payer: Self-pay | Admitting: Internal Medicine

## 2016-04-27 ENCOUNTER — Other Ambulatory Visit: Payer: Self-pay | Admitting: Internal Medicine

## 2016-05-04 ENCOUNTER — Ambulatory Visit (INDEPENDENT_AMBULATORY_CARE_PROVIDER_SITE_OTHER): Payer: Medicare Other | Admitting: Internal Medicine

## 2016-05-04 ENCOUNTER — Encounter: Payer: Self-pay | Admitting: Internal Medicine

## 2016-05-04 VITALS — BP 126/86 | HR 68 | Temp 97.5°F | Resp 16 | Ht 68.0 in | Wt 166.8 lb

## 2016-05-04 DIAGNOSIS — E559 Vitamin D deficiency, unspecified: Secondary | ICD-10-CM

## 2016-05-04 DIAGNOSIS — I10 Essential (primary) hypertension: Secondary | ICD-10-CM | POA: Diagnosis not present

## 2016-05-04 DIAGNOSIS — E039 Hypothyroidism, unspecified: Secondary | ICD-10-CM | POA: Diagnosis not present

## 2016-05-04 DIAGNOSIS — E782 Mixed hyperlipidemia: Secondary | ICD-10-CM | POA: Diagnosis not present

## 2016-05-04 DIAGNOSIS — Z79899 Other long term (current) drug therapy: Secondary | ICD-10-CM | POA: Diagnosis not present

## 2016-05-04 DIAGNOSIS — R7303 Prediabetes: Secondary | ICD-10-CM

## 2016-05-04 LAB — CBC WITH DIFFERENTIAL/PLATELET
BASOS ABS: 45 {cells}/uL (ref 0–200)
Basophils Relative: 1 %
EOS ABS: 90 {cells}/uL (ref 15–500)
Eosinophils Relative: 2 %
HCT: 41.3 % (ref 38.5–50.0)
Hemoglobin: 14 g/dL (ref 13.2–17.1)
LYMPHS PCT: 34 %
Lymphs Abs: 1530 cells/uL (ref 850–3900)
MCH: 32.6 pg (ref 27.0–33.0)
MCHC: 33.9 g/dL (ref 32.0–36.0)
MCV: 96 fL (ref 80.0–100.0)
MONOS PCT: 11 %
MPV: 10.3 fL (ref 7.5–12.5)
Monocytes Absolute: 495 cells/uL (ref 200–950)
Neutro Abs: 2340 cells/uL (ref 1500–7800)
Neutrophils Relative %: 52 %
PLATELETS: 260 10*3/uL (ref 140–400)
RBC: 4.3 MIL/uL (ref 4.20–5.80)
RDW: 13.7 % (ref 11.0–15.0)
WBC: 4.5 10*3/uL (ref 3.8–10.8)

## 2016-05-04 LAB — TSH: TSH: 4.42 mIU/L (ref 0.40–4.50)

## 2016-05-04 NOTE — Patient Instructions (Signed)

## 2016-05-04 NOTE — Progress Notes (Signed)
This very nice 76 y.o. Single WM presents for 3 month follow up with Hypertension, Hyperlipidemia, Pre-Diabetes and Vitamin D Deficiency.      Patient is treated for HTN (1989)  & BP has been controlled at home. Today's BP is at goal -  126/86. Patient has had no complaints of any cardiac type chest pain, palpitations, dyspnea/orthopnea/PND, dizziness, claudication, or dependent edema.     Hyperlipidemia is controlled with diet & meds. Patient denies myalgias or other med SE's. Last Lipids were at goal albeit elevated Trig's: Lab Results  Component Value Date   CHOL 193 02/04/2016   HDL 81 02/04/2016   LDLCALC 73 02/04/2016   TRIG 195 (H) 02/04/2016   CHOLHDL 2.4 02/04/2016      Also, the patient has history of PreDiabetes (A1c 5.8% in 2014)  and has had no symptoms of reactive hypoglycemia, diabetic polys, paresthesias or visual blurring.  Last A1c was back at goal: Lab Results  Component Value Date   HGBA1C 4.9 02/04/2016      Patient has been on Thyroid replacement since 1997. Further, the patient also has history of Vitamin D Deficiency ("24" in 2008)  and supplements vitamin D without any suspected side-effects. Last vitamin D was at goal: Lab Results  Component Value Date   VD25OH 58 02/04/2016   Current Outpatient Prescriptions on File Prior to Visit  Medication Sig  . Cholecalciferol (VITAMIN D-3) 1000 UNITS CAPS Take 8,000 Units by mouth daily.  [name] latanoprost (XALATAN) 0.005 % ophthalmic solution at bedtime.   [name] levothyroxine (SYNTHROID, LEVOTHROID) 175 MCG tablet Please take 1.5 tablets by mouth Monday-Friday.  Please take 1 tablet on Saturday and Sunday (Patient taking differently: Please take 1.5 tablets by mouth 4 days a week and take 1 tablet on  Tues and Thurs.)  . meloxicam (MOBIC) 15 MG tablet Take 1/2 to 1 tab daily with food for pain and inflammation.  [name] PARoxetine (PAXIL) 20 MG tablet take 1 tablet by mouth once daily  . labetalol (NORMODYNE) 200 MG tablet  take 1/2-1 tablet by mouth twice a day for blood pressure (Patient not taking: Reported on 05/04/2016)  . lisinopril-hydrochlorothiazide (PRINZIDE,ZESTORETIC) 20-25 MG per tablet take 1 tablet by mouth once daily (Patient not taking: Reported on 05/04/2016)   No current facility-administered medications on file prior to visit.    Allergies  Allergen Reactions  . Penicillins     REACTION: rash,swelling  . 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 Sulfonamide Derivatives     REACTION: itching,swelling   PMHx:   Past Medical History:  Diagnosis Date  . BPH (benign prostatic hyperplasia)   . Hyperlipidemia   . Hypertension   . Hypogonadism, male   . Pre-diabetes   . Prostatitis   . Thyroid disease    Immunization History  Administered Date(s) Administered  . DT 02/04/2015  . DTaP 01/15/2004  . Influenza, High Dose Seasonal PF 12/26/2013, 11/05/2014, 11/12/2015  . Pneumococcal Polysaccharide-23 02/26/2007   Past Surgical History:  Procedure Laterality Date  . APPENDECTOMY     FHx:    Reviewed / unchanged  SHx:    Reviewed / unchanged  Systems Review:  Constitutional: Denies fever, chills, wt changes, headaches, insomnia, fatigue, night sweats, change in appetite. Eyes: Denies redness, blurred vision, diplopia, discharge, itchy, watery eyes.  ENT: Denies discharge, congestion, post nasal drip, epistaxis, sore throat, earache, hearing loss, dental pain, tinnitus, vertigo, sinus pain, snoring.  CV:  Denies chest pain, palpitations, irregular heartbeat, syncope, dyspnea, diaphoresis, orthopnea, PND, claudication or edema. Respiratory: denies cough, dyspnea, DOE, pleurisy, hoarseness, laryngitis, wheezing.  Gastrointestinal: Denies dysphagia, odynophagia, heartburn, reflux, water brash, abdominal pain or cramps, nausea, vomiting, bloating, diarrhea, constipation, hematemesis, melena, hematochezia  or hemorrhoids. Genitourinary: Denies dysuria,  frequency, urgency, nocturia, hesitancy, discharge, hematuria or flank pain. Musculoskeletal: Denies arthralgias, myalgias, stiffness, jt. swelling, pain, limping or strain/sprain.  Skin: Denies pruritus, rash, hives, warts, acne, eczema or change in skin lesion(s). Neuro: No weakness, tremor, incoordination, spasms, paresthesia or pain. Psychiatric: Denies confusion, memory loss or sensory loss. Endo: Denies change in weight, skin or hair change.  Heme/Lymph: No excessive bleeding, bruising or enlarged lymph nodes.  Physical Exam  BP 126/86   Pulse 68   Temp 97.5 F (36.4 C)   Resp 16   Ht 5\' 8"  (1.727 m)   Wt 166 lb 12.8 oz (75.7 kg)   BMI 25.36 kg/m   Appears well nourished and in no distress.  Eyes: PERRLA, EOMs, conjunctiva no swelling or erythema. Sinuses: No frontal/maxillary tenderness ENT/Mouth: EAC's clear, TM's nl w/o erythema, bulging. Nares clear w/o erythema, swelling, exudates. Oropharynx clear without erythema or exudates. Oral hygiene is good. Tongue normal, non obstructing. Hearing intact.  Neck: Supple. Thyroid nl. Car 2+/2+ without bruits, nodes or JVD. Chest: Respirations nl with BS clear & equal w/o rales, rhonchi, wheezing or stridor.  Cor: Heart sounds normal w/ regular rate and rhythm without sig. murmurs, gallops, clicks, or rubs. Peripheral pulses normal and equal  without edema.  Abdomen: Soft & bowel sounds normal. Non-tender w/o guarding, rebound, hernias, masses, or organomegaly.  Lymphatics: Unremarkable.  Musculoskeletal: Full ROM all peripheral extremities, joint stability, 5/5 strength, and normal gait.  Skin: Warm, dry without exposed rashes, lesions or ecchymosis apparent.  Neuro: Cranial nerves intact, reflexes equal bilaterally. Sensory-motor testing grossly intact. Tendon reflexes grossly intact.  Pysch: Alert & oriented x 3.  Insight and judgement nl & appropriate. No ideations.  Assessment and Plan:   1. Essential hypertension  -  Continue medication, monitor blood pressure at home.  - Continue DASH diet. Reminder to go to the ER if any CP,  SOB, nausea, dizziness, severe HA, changes vision/speech,  left arm numbness and tingling and jaw pain.  - CBC with Differential/Platelet - BASIC METABOLIC PANEL WITH GFR - Magnesium - TSH  2. Mixed hyperlipidemia  - Continue diet/meds, exercise,& lifestyle modifications.  - Continue monitor periodic cholesterol/liver & renal functions   - Hepatic function panel - Lipid panel - TSH  3. Prediabetes  - Continue diet, exercise, lifestyle modifications.  - Monitor appropriate labs.  - Hemoglobin A1c - Insulin, random  4. Vitamin D deficiency  - Continue supplementation.  - VITAMIN D 25 Hydroxy   5. Hypothyroidism  - TSH  6. Medication management  - CBC with Differential/Platelet - BASIC METABOLIC PANEL WITH GFR - Hepatic function panel - Magnesium - Lipid panel - TSH - Hemoglobin A1c - Insulin, random - VITAMIN D 25 Hydroxy        Recommended regular exercise, BP monitoring, weight control, and discussed med and SE's. Recommended labs to assess and monitor clinical status. Further disposition pending results of labs. Over 30 minutes of exam, counseling, chart review was performed

## 2016-05-05 LAB — BASIC METABOLIC PANEL WITH GFR
BUN: 15 mg/dL (ref 7–25)
CHLORIDE: 105 mmol/L (ref 98–110)
CO2: 27 mmol/L (ref 20–31)
CREATININE: 0.88 mg/dL (ref 0.70–1.18)
Calcium: 9 mg/dL (ref 8.6–10.3)
GFR, Est African American: >89 mL/min (ref >=60)
GFR, Est Non African American: 85 mL/min (ref >=60)
Glucose, Bld: 98 mg/dL (ref 65–99)
Potassium: 4.5 mmol/L (ref 3.5–5.3)
Sodium: 140 mmol/L (ref 135–146)

## 2016-05-05 LAB — LIPID PANEL
CHOLESTEROL: 210 mg/dL — AB (ref <200)
HDL: 97 mg/dL (ref >40)
LDL CALC: 80 mg/dL (ref <100)
Total CHOL/HDL Ratio: 2.2 Ratio (ref <5.0)
Triglycerides: 165 mg/dL — ABNORMAL HIGH (ref <150)
VLDL: 33 mg/dL — AB (ref <30)

## 2016-05-05 LAB — MAGNESIUM: Magnesium: 1.9 mg/dL (ref 1.5–2.5)

## 2016-05-05 LAB — HEPATIC FUNCTION PANEL
ALK PHOS: 93 U/L (ref 40–115)
ALT: 24 U/L (ref 9–46)
AST: 40 U/L — ABNORMAL HIGH (ref 10–35)
Albumin: 2.9 g/dL — ABNORMAL LOW (ref 3.6–5.1)
BILIRUBIN INDIRECT: 0.3 mg/dL (ref 0.2–1.2)
Bilirubin, Direct: 0.1 mg/dL (ref <=0.2)
TOTAL PROTEIN: 4.8 g/dL — AB (ref 6.1–8.1)
Total Bilirubin: 0.4 mg/dL (ref 0.2–1.2)

## 2016-05-05 LAB — VITAMIN D 25 HYDROXY (VIT D DEFICIENCY, FRACTURES): Vit D, 25-Hydroxy: 60 ng/mL (ref 30–100)

## 2016-05-05 LAB — HEMOGLOBIN A1C
HEMOGLOBIN A1C: 4.8 % (ref <5.7)
MEAN PLASMA GLUCOSE: 91 mg/dL

## 2016-05-05 LAB — INSULIN, RANDOM: Insulin: 4.5 u[IU]/mL (ref 2.0–19.6)

## 2016-07-04 ENCOUNTER — Encounter: Payer: Self-pay | Admitting: Internal Medicine

## 2016-07-04 ENCOUNTER — Ambulatory Visit (INDEPENDENT_AMBULATORY_CARE_PROVIDER_SITE_OTHER): Payer: Medicare Other | Admitting: Internal Medicine

## 2016-07-04 VITALS — BP 154/88 | HR 76 | Temp 97.5°F | Resp 16 | Ht 68.0 in | Wt 171.8 lb

## 2016-07-04 DIAGNOSIS — K219 Gastro-esophageal reflux disease without esophagitis: Secondary | ICD-10-CM

## 2016-07-04 DIAGNOSIS — I10 Essential (primary) hypertension: Secondary | ICD-10-CM

## 2016-07-04 MED ORDER — RANITIDINE HCL 300 MG PO TABS: ORAL_TABLET | ORAL | 1 refills | Status: DC

## 2016-07-04 NOTE — Progress Notes (Signed)
  Subjective:    Patient ID: Gregory Galvan, male    DOB: [birth year], 76 y.o.   MRN: [id number]  HPI  This very nice 76 yo single WM with HTN, HLD, preDM, Hypothyroidism, presented with 2 week hx/o intermittent dark ? black stools. Has occas EG burning but no water brash or reflux. Also denies N/V, and has had some diarrhea ~ soft, but formed  BM's and no hematochezia.     Medication Sig  . VITAMIN D 1000 UNITS CAPS Take 8,000 Units by mouth daily.  [name] labetalol  200 MG tablet take 1 tab twice a day - NOT TAKING  . XALATANophth soln at bedtime.   [name] levothyroxine  175 MCG tablet Take 1.5 tab 5 days/week & take 1 tab 2x/wk on Tues/Thurs  . lisinopril-hctz 20-25  take 1 tablet  once daily - NOT TAKING  . meloxicam  15 MG tablet Take 1/2 to 1 tab daily with food for pain and inflammation.  [name] PARoxetine  20 MG tablet take 1 tablet by mouth once daily   Allergies  Allergen Reactions  . Penicillins     REACTION: rash,swelling  . 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 Sulfonamide Derivatives     REACTION: itching,swelling   Review of Systems  10 point systems review negative except as above.    Objective:   Physical Exam   BP (!) 154/88   Pulse 76   Temp 97.5 F (36.4 C)   Resp 16   Ht 5\' 8"  (1.727 m)   Wt 171 lb 12.8 oz (77.9 kg)   BMI 26.12 kg/m   HEENT - WNL. Neck - supple.  Chest - Clear equal BS. Cor - Nl HS. RRR w/o sig MGR. PP 1(+). No edema. Abd - Soft , benign, w/o masses or tenderness.  DRE - Nl P and no masses. Stool hemoccult - Negative MS- FROM w/o deformities.  Gait Nl. Neuro -  Nl w/o focal abnormalities.    Assessment & Plan:   1. Essential hypertension  - recc restart his Labetalol and Lisinopril-Hctz back at 1/2 dose and monitor BP's.   2. Gastroesophageal reflux disease without esophagitis  - reassured and discussed prudent diet.

## 2016-08-14 NOTE — Progress Notes (Signed)
MEDICARE ANNUAL WELLNESS VISIT AND FOLLOW UP Assessment:   Essential hypertension - continue medications, DASH diet, exercise and monitor at home. Call if greater than 130/80.  - CBC with Differential/Platelet - BASIC METABOLIC PANEL WITH GFR - Hepatic function panel -     furosemide (LASIX) 40 MG tablet; 1-2 pills a day for swelling  Prediabetes Discussed general issues about diabetes pathophysiology and management., Educational material distributed., Suggested low cholesterol diet., Encouraged aerobic exercise., Discussed foot care., Reminded to get yearly retinal exam. -Cut back on wine/stop -  Hyperlipidemia -continue medications, check lipids, decrease fatty foods, increase activity.  - Lipid panel   Hypothyroidism, unspecified hypothyroidism type Hypothyroidism-check TSH level, continue medications the same, reminded to take on an empty stomach 30-33mins before food.  - TSH   Medication management - Magnesium  Vitamin D deficiency - Vit D  25 hydroxy (rtn osteoporosis monitoring)   Fibromyalgia Continue paxil and meloxicam  Routine general medical examination at a health care facility Schedule for colonoscopy  Depression, major, in remission Continue paxil  Glaucoma (increased eye pressure) Continue follow up Dr. Delman Cheadle  Body mass index (BMI) of 24.0-24.9 in adult  Edema, unspecified type No signs of symptoms of CHF, no orthopnea, PND Decrease drinking Concerning with low albumin, check labs May need AB [place] Switch HCTZ to lasix, follow up 1 week -     furosemide (LASIX) 40 MG tablet; 1-2 pills a day for swelling -     Hepatic function panel -     Brain natriuretic peptide -     Urinalysis, Routine w reflex microscopic -     Microalbumin / creatinine urine ratio  Proteins serum plasma low -     Urinalysis, Routine w reflex microscopic -     Microalbumin / creatinine urine ratio -     lisinopril (PRINIVIL,ZESTRIL) 20 MG tablet; Take 1 tablet (20 mg total) by  mouth daily.  Anemia, unspecified type -     Iron and TIBC -     Vitamin B12  Elevated LFTs Advised to stop drinking alcohol, no tylenol Check labs, may need AB [place] or [place] -     Hepatic function panel -     Protime-INR  Encounter for Medicare annual wellness exam   Over 30 minutes of exam, counseling, chart review, and critical decision making was performed  Plan:   During the course of the visit the patient was educated and counseled about appropriate screening and preventive services including:    Pneumococcal vaccine   Influenza vaccine  Td vaccine  Screening electrocardiogram  Colorectal cancer screening  Diabetes screening  Glaucoma screening  Nutrition counseling    Subjective:  Gregory Galvan is a 76 y.o. male who presents for Medicare Annual Wellness Visit and 3 month follow up for HTN, hyperlipidemia, prediabetes, and vitamin D Def.   His blood pressure has been controlled at home, today their BP is BP: 124/80 He does not workout regular but occ walks. He denies chest pain, shortness of breath, dizziness.  He complains of bilateral leg swelling x3-4 weeks, no SOB, no orthopnea, no PND. He does drink a bottle of wine a night, he has had elevated LFTs and last visit his albumin was low.  Lab Results  Component Value Date   ALT 24 05/04/2016   AST 40 (H) 05/04/2016   ALKPHOS 93 05/04/2016   BILITOT 0.4 05/04/2016   He is not on cholesterol medication and denies myalgias. His cholesterol is at goal. The cholesterol  last visit was:   Lab Results  Component Value Date   CHOL 210 (H) 05/04/2016   HDL 97 05/04/2016   LDLCALC 80 05/04/2016   TRIG 165 (H) 05/04/2016   CHOLHDL 2.2 05/04/2016   He has been working on diet and exercise for prediabetes, and denies paresthesia of the feet, polydipsia, polyuria and visual disturbances. Last A1C in the office was:  Lab Results  Component Value Date   HGBA1C 4.8 05/04/2016   He is on thyroid medication. His  medication was not changed last visit, takes 1 pill 2 days a week and 1.5 pill 5 days a week.   Lab Results  Component Value Date   TSH 4.42 05/04/2016   He has a history of depression/FM and is on paxil. He has lower back pain, and complains of left arm tingling in the morning. Denies weakness, pain down his legs/arms.  Patient is on Vitamin D supplement.   Lab Results  Component Value Date   VD25OH 60 05/04/2016     BMI is Body mass index is 25.85 kg/m., he is working on diet and exercise. Wt Readings from Last 3 Encounters:  08/15/16 170 lb (77.1 kg)  07/04/16 171 lb 12.8 oz (77.9 kg)  05/04/16 166 lb 12.8 oz (75.7 kg)    Medication Review: Current Outpatient Prescriptions on File Prior to Visit  Medication Sig Dispense Refill  . Cholecalciferol (VITAMIN D-3) 1000 UNITS CAPS Take 8,000 Units by mouth daily.    [name] labetalol (NORMODYNE) 200 MG tablet take 1/2-1 tablet by mouth twice a day for blood pressure 90 tablet 1  . latanoprost (XALATAN) 0.005 % ophthalmic solution at bedtime.     [name] levothyroxine (SYNTHROID, LEVOTHROID) 175 MCG tablet Please take 1.5 tablets by mouth Monday-Friday.  Please take 1 tablet on Saturday and Sunday (Patient taking differently: Please take 1.5 tablets by mouth 4 days a week and take 1 tablet on  Tues and Thurs.) 114 tablet 1  . lisinopril-hydrochlorothiazide (PRINZIDE,ZESTORETIC) 20-25 MG per tablet take 1 tablet by mouth once daily 90 tablet 1  . meloxicam (MOBIC) 15 MG tablet Take 1/2 to 1 tab daily with food for pain and inflammation. 90 tablet 0  . PARoxetine (PAXIL) 20 MG tablet take 1 tablet by mouth once daily 90 tablet 1  . ranitidine (ZANTAC) 300 MG tablet Take 1 to 2 tablets daily for heartburn & reflux 180 tablet 1   No current facility-administered medications on file prior to visit.     Current Problems (verified) Patient Active Problem List   Diagnosis Date Noted  . Other abnormal glucose 08/19/2015  . Body mass index (BMI) of  24.0-24.9 in adult 11/05/2014  . Depression, major, in remission ([place]) 07/23/2014  . Glaucoma (increased eye pressure) 07/23/2014  . Medication management 09/19/2013  . Essential hypertension 02/18/2013  . Hypothyroidism 02/18/2013  . Mixed hyperlipidemia 02/18/2013  . Fibromyalgia 02/18/2013  . Vitamin D deficiency 02/18/2013    Screening Tests Immunization History  Administered Date(s) Administered  . DT 02/04/2015  . DTaP 01/15/2004  . Influenza, High Dose Seasonal PF 12/26/2013, 11/05/2014, 11/12/2015  . Pneumococcal Polysaccharide-23 02/26/2007    Preventative care: Last colonoscopy: 2010 due 2015 Dr. Deatra Ina CXR 2012  Prior vaccinations: TD or Tdap: 2016 Influenza: 2015 Pneumococcal: 2009 Prevnar13: out of in the office Shingles/Zostavax: declines  Names of Other Physician/Practitioners you currently use: 1. [facility] here for primary care 2. Dr. Delman Cheadle, eye doctor, last visit June 2018 3. Dr.  Modinger, dentist, last visit 2 years ago Patient Care Team: Unk Pinto, MD as PCP - General (Internal Medicine) Inda Castle, MD as Consulting Physician (Gastroenterology)  Allergies Allergies  Allergen Reactions  . Penicillins     REACTION: rash,swelling  . 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 Sulfonamide Derivatives     REACTION: itching,swelling    SURGICAL HISTORY He  has a past surgical history that includes Appendectomy. FAMILY HISTORY His family history includes Alzheimer's disease in his mother; Cancer in his mother; Diabetes in his brother; Heart disease in his brother, brother, and father; Hypertension in his brother, brother, and father; Lymphoma in his father. SOCIAL HISTORY He  reports that he quit smoking about 2 years ago. He has never used smokeless tobacco. He reports that he drinks about 16.8 oz of alcohol per week . He reports that he does not use  drugs.  MEDICARE WELLNESS OBJECTIVES: Tobacco use: He does not smoke.  Patient is a former smoker. Alcohol Current alcohol use: > 5 glasses of wine per week(s) Caffeine Current caffeine use: coffee 1 /day Osteoporosis: dietary calcium and/or vitamin D deficiency, History of fracture in the past year: no Diet: in general, a "healthy" diet   Physical activity: ADLs and sendentary work Depression/mood screen:   Depression screen [facility] 2/9 05/04/2016  Decreased Interest 0  Down, Depressed, Hopeless 0  PHQ - 2 Score 0   ADLs:  In your present state of health, do you have any difficulty performing the following activities: 05/04/2016  Hearing? N  Vision? N  Difficulty concentrating or making decisions? N  Walking or climbing stairs? N  Dressing or bathing? N  Doing errands, shopping? N  Some recent data might be hidden    Cognitive Testing  Alert? Yes  Normal Appearance?Yes  Oriented to person? Yes  Place? Yes   Time? Yes  Recall of three objects?  Yes  Can perform simple calculations? Yes  Displays appropriate judgment?Yes  Can read the correct time from a watch face?Yes  EOL planning: Does Patient Have a Medical Advance Directive?: No Would patient like information on creating a medical advance directive?: Yes (ED - Information included in AVS)   Review of Systems  Constitutional: Negative.   HENT: Negative for congestion, ear discharge, ear pain, hearing loss, nosebleeds, sore throat and tinnitus.   Eyes: Negative.  Negative for blurred vision.  Respiratory: Negative.  Negative for shortness of breath and stridor.   Gastrointestinal: Negative.  Negative for abdominal pain, constipation and diarrhea.  Genitourinary: Negative.   Musculoskeletal: Positive for back pain and neck pain. Negative for falls, joint pain and myalgias.  Skin: Negative.   Neurological: Negative.  Negative for headaches.  Psychiatric/Behavioral: Negative.  Negative for depression, memory loss and suicidal  ideas. The patient does not have insomnia.   All other systems reviewed and are negative.    Objective:   Blood pressure 124/80, pulse 86, temperature 98.1 F (36.7 C), resp. rate 14, height 5\' 8"  (1.727 m), weight 170 lb (77.1 kg), SpO2 97 %. Body mass index is 25.85 kg/m.  General appearance: alert, no distress, WD/WN, male HEENT: normocephalic, sclerae anicteric, TMs pearly, nares patent, no discharge or erythema, pharynx normal Oral cavity: MMM, no lesions Neck: supple, no lymphadenopathy, no thyromegaly, no masses Heart: RRR, normal S1, S2, no murmurs Lungs: CTA bilaterally, no wheezes, rhonchi, or rales Abdomen: +bs, soft, non tender, non distended, no masses, no hepatomegaly, no splenomegaly Musculoskeletal: nontender,  no swelling, no obvious deformity Extremities: 1-2+ edema, no cyanosis, no clubbing Pulses: 2+ symmetric, upper and lower extremities, normal cap refill Neurological: alert, oriented x 3, CN2-12 intact, strength normal upper extremities and lower extremities, sensation normal throughout, DTRs 2+ throughout, no cerebellar signs, gait normal Psychiatric: normal affect, behavior normal, pleasant   Medicare Attestation I have personally reviewed: The patient's medical and social history Their use of alcohol, tobacco or illicit drugs Their current medications and supplements The patient's functional ability including ADLs,fall risks, home safety risks, cognitive, and hearing and visual impairment Diet and physical activities Evidence for depression or mood disorders  The patient's weight, height, BMI, and visual acuity have been recorded in the chart.  I have made referrals, counseling, and provided education to the patient based on review of the above and I have provided the patient with a written personalized care plan for preventive services.     Vicie Mutters, PA-C   08/15/2016

## 2016-08-15 ENCOUNTER — Ambulatory Visit (INDEPENDENT_AMBULATORY_CARE_PROVIDER_SITE_OTHER): Payer: Medicare Other | Admitting: Physician Assistant

## 2016-08-15 ENCOUNTER — Encounter: Payer: Self-pay | Admitting: Physician Assistant

## 2016-08-15 VITALS — BP 124/80 | HR 86 | Temp 98.1°F | Resp 14 | Ht 68.0 in | Wt 170.0 lb

## 2016-08-15 DIAGNOSIS — E039 Hypothyroidism, unspecified: Secondary | ICD-10-CM | POA: Diagnosis not present

## 2016-08-15 DIAGNOSIS — Z79899 Other long term (current) drug therapy: Secondary | ICD-10-CM | POA: Diagnosis not present

## 2016-08-15 DIAGNOSIS — E559 Vitamin D deficiency, unspecified: Secondary | ICD-10-CM

## 2016-08-15 DIAGNOSIS — R7989 Other specified abnormal findings of blood chemistry: Secondary | ICD-10-CM

## 2016-08-15 DIAGNOSIS — R945 Abnormal results of liver function studies: Secondary | ICD-10-CM

## 2016-08-15 DIAGNOSIS — E8809 Other disorders of plasma-protein metabolism, not elsewhere classified: Secondary | ICD-10-CM | POA: Diagnosis not present

## 2016-08-15 DIAGNOSIS — Z0001 Encounter for general adult medical examination with abnormal findings: Secondary | ICD-10-CM | POA: Diagnosis not present

## 2016-08-15 DIAGNOSIS — E782 Mixed hyperlipidemia: Secondary | ICD-10-CM

## 2016-08-15 DIAGNOSIS — R7309 Other abnormal glucose: Secondary | ICD-10-CM | POA: Diagnosis not present

## 2016-08-15 DIAGNOSIS — F325 Major depressive disorder, single episode, in full remission: Secondary | ICD-10-CM

## 2016-08-15 DIAGNOSIS — D649 Anemia, unspecified: Secondary | ICD-10-CM

## 2016-08-15 DIAGNOSIS — Z6824 Body mass index (BMI) 24.0-24.9, adult: Secondary | ICD-10-CM

## 2016-08-15 DIAGNOSIS — H409 Unspecified glaucoma: Secondary | ICD-10-CM

## 2016-08-15 DIAGNOSIS — R6889 Other general symptoms and signs: Secondary | ICD-10-CM

## 2016-08-15 DIAGNOSIS — M790 Rheumatism, unspecified: Secondary | ICD-10-CM | POA: Diagnosis not present

## 2016-08-15 DIAGNOSIS — R609 Edema, unspecified: Secondary | ICD-10-CM

## 2016-08-15 DIAGNOSIS — N049 Nephrotic syndrome with unspecified morphologic changes: Secondary | ICD-10-CM

## 2016-08-15 DIAGNOSIS — I10 Essential (primary) hypertension: Secondary | ICD-10-CM | POA: Diagnosis not present

## 2016-08-15 DIAGNOSIS — M797 Fibromyalgia: Secondary | ICD-10-CM

## 2016-08-15 DIAGNOSIS — Z0000 Encounter for general adult medical examination without abnormal findings: Secondary | ICD-10-CM

## 2016-08-15 LAB — BASIC METABOLIC PANEL WITH GFR
BUN: 13 mg/dL (ref 7–25)
CALCIUM: 9 mg/dL (ref 8.6–10.3)
CO2: 25 mmol/L (ref 20–31)
Chloride: 101 mmol/L (ref 98–110)
Creat: 0.89 mg/dL (ref 0.70–1.18)
GFR, EST NON AFRICAN AMERICAN: 84 mL/min (ref >=60)
Glucose, Bld: 103 mg/dL — ABNORMAL HIGH (ref 65–99)
Potassium: 4.4 mmol/L (ref 3.5–5.3)
SODIUM: 135 mmol/L (ref 135–146)

## 2016-08-15 LAB — LIPID PANEL
CHOL/HDL RATIO: 2.8 ratio (ref <5.0)
CHOLESTEROL: 249 mg/dL — AB (ref <200)
HDL: 90 mg/dL (ref >40)
LDL Cholesterol: 120 mg/dL — ABNORMAL HIGH (ref <100)
TRIGLYCERIDES: 196 mg/dL — AB (ref <150)
VLDL: 39 mg/dL — ABNORMAL HIGH (ref <30)

## 2016-08-15 LAB — HEPATIC FUNCTION PANEL
ALT: 26 U/L (ref 9–46)
AST: 46 U/L — ABNORMAL HIGH (ref 10–35)
Albumin: 2.8 g/dL — ABNORMAL LOW (ref 3.6–5.1)
Alkaline Phosphatase: 101 U/L (ref 40–115)
BILIRUBIN DIRECT: 0.1 mg/dL (ref <=0.2)
BILIRUBIN INDIRECT: 0.2 mg/dL (ref 0.2–1.2)
Total Bilirubin: 0.3 mg/dL (ref 0.2–1.2)
Total Protein: 4.9 g/dL — ABNORMAL LOW (ref 6.1–8.1)

## 2016-08-15 LAB — CBC WITH DIFFERENTIAL/PLATELET
Basophils Absolute: 0 cells/uL (ref 0–200)
Basophils Relative: 0 %
EOS PCT: 2 %
Eosinophils Absolute: 128 cells/uL (ref 15–500)
HCT: 42.3 % (ref 38.5–50.0)
HEMOGLOBIN: 14.4 g/dL (ref 13.2–17.1)
LYMPHS ABS: 1792 {cells}/uL (ref 850–3900)
Lymphocytes Relative: 28 %
MCH: 32.7 pg (ref 27.0–33.0)
MCHC: 34 g/dL (ref 32.0–36.0)
MCV: 95.9 fL (ref 80.0–100.0)
MPV: 9.9 fL (ref 7.5–12.5)
Monocytes Absolute: 768 cells/uL (ref 200–950)
Monocytes Relative: 12 %
NEUTROS ABS: 3712 {cells}/uL (ref 1500–7800)
NEUTROS PCT: 58 %
Platelets: 281 10*3/uL (ref 140–400)
RBC: 4.41 MIL/uL (ref 4.20–5.80)
RDW: 13.6 % (ref 11.0–15.0)
WBC: 6.4 10*3/uL (ref 3.8–10.8)

## 2016-08-15 LAB — MAGNESIUM: Magnesium: 1.9 mg/dL (ref 1.5–2.5)

## 2016-08-15 LAB — TSH: TSH: 4.15 m[IU]/L (ref 0.40–4.50)

## 2016-08-15 LAB — IRON AND TIBC
%SAT: 48 % (ref 15–60)
Iron: 103 ug/dL (ref 50–180)
TIBC: 216 ug/dL — ABNORMAL LOW (ref 250–425)
UIBC: 113 ug/dL

## 2016-08-15 MED ORDER — FUROSEMIDE 40 MG PO TABS: ORAL_TABLET | ORAL | 11 refills | Status: AC

## 2016-08-15 MED ORDER — LISINOPRIL 20 MG PO TABS: 20.0000 mg | ORAL_TABLET | Freq: Every day | ORAL | 3 refills | Status: DC

## 2016-08-15 NOTE — Patient Instructions (Addendum)
Stop the lisinopril with HCTZ, get on just the lisinopril 20mg  dialy Will add on lasix 40mg  do 2 pills a day for 3 days then 1 pill a day Monitor weight, leg pain Decrease drinking Will check labs Do low sodium diet, no process foods Elevated your legs get compression stockings Follow up 1-2 weeks   Edema Edema is an abnormal buildup of fluids in your bodytissues. Edema is somewhatdependent on gravity to pull the fluid to the lowest place in your body. That makes the condition more common in the legs and thighs (lower extremities). Painless swelling of the feet and ankles is common and becomes more likely as you get older. It is also common in looser tissues, like around your eyes. When the affected area is squeezed, the fluid may move out of that spot and leave a dent for a few moments. This dent is called pitting. What are the causes? There are many possible causes of edema. Eating too much salt and being on your feet or sitting for a long time can cause edema in your legs and ankles. Hot weather may make edema worse. Common medical causes of edema include:  Heart failure.  Liver disease.  Kidney disease.  Weak blood vessels in your legs.  Cancer.  An injury.  Pregnancy.  Some medications.  Obesity.  What are the signs or symptoms? Edema is usually painless.Your skin may look swollen or shiny. How is this diagnosed? Your health care provider may be able to diagnose edema by asking about your medical history and doing a physical exam. You may need to have tests such as X-rays, an electrocardiogram, or blood tests to check for medical conditions that may cause edema. How is this treated? Edema treatment depends on the cause. If you have heart, liver, or kidney disease, you need the treatment appropriate for these conditions. General treatment may include:  Elevation of the affected body part above the level of your heart.  Compression of the affected body part. Pressure  from elastic bandages or support stockings squeezes the tissues and forces fluid back into the blood vessels. This keeps fluid from entering the tissues.  Restriction of fluid and salt intake.  Use of a water pill (diuretic). These medications are appropriate only for some types of edema. They pull fluid out of your body and make you urinate more often. This gets rid of fluid and reduces swelling, but diuretics can have side effects. Only use diuretics as directed by your health care provider.  Follow these instructions at home:  Keep the affected body part above the level of your heart when you are lying down.  Do not sit still or stand for prolonged periods.  Do not put anything directly under your knees when lying down.  Do not wear constricting clothing or garters on your upper legs.  Exercise your legs to work the fluid back into your blood vessels. This may help the swelling go down.  Wear elastic bandages or support stockings to reduce ankle swelling as directed by your health care provider.  Eat a low-salt diet to reduce fluid if your health care provider recommends it.  Only take medicines as directed by your health care provider. Contact a health care provider if:  Your edema is not responding to treatment.  You have heart, liver, or kidney disease and notice symptoms of edema.  You have edema in your legs that does not improve after elevating them.  You have sudden and unexplained weight gain. Get  help right away if:  You develop shortness of breath or chest pain.  You cannot breathe when you lie down.  You develop pain, redness, or warmth in the swollen areas.  You have heart, liver, or kidney disease and suddenly get edema.  You have a fever and your symptoms suddenly get worse. This information is not intended to replace advice given to you by your health care provider. Make sure you discuss any questions you have with your health care provider. Document  Released: 01/31/2005 Document Revised: 07/09/2015 Document Reviewed: 11/23/2012 Elsevier Interactive Patient Education  2017 [employer].

## 2016-08-16 LAB — MICROALBUMIN / CREATININE URINE RATIO
CREATININE, URINE: 52 mg/dL (ref 20–370)
Microalb Creat Ratio: 1412 mcg/mg creat — ABNORMAL HIGH (ref <30)
Microalb, Ur: 73.4 mg/dL

## 2016-08-16 LAB — URINALYSIS, ROUTINE W REFLEX MICROSCOPIC
BILIRUBIN URINE: NEGATIVE
GLUCOSE, UA: NEGATIVE
Hgb urine dipstick: NEGATIVE
KETONES UR: NEGATIVE
Leukocytes, UA: NEGATIVE
Nitrite: NEGATIVE
SPECIFIC GRAVITY, URINE: 1.01 (ref 1.001–1.035)
pH: 7.5 (ref 5.0–8.0)

## 2016-08-16 LAB — URINALYSIS, MICROSCOPIC ONLY
BACTERIA UA: NONE SEEN [HPF]
CRYSTALS: NONE SEEN [HPF]
Casts: NONE SEEN [LPF]
SQUAMOUS EPITHELIAL / LPF: NONE SEEN [HPF] (ref <=5)
WBC UA: NONE SEEN WBC/HPF (ref <=5)
Yeast: NONE SEEN [HPF]

## 2016-08-16 LAB — BRAIN NATRIURETIC PEPTIDE: BRAIN NATRIURETIC PEPTIDE: 34.1 pg/mL (ref <100)

## 2016-08-16 LAB — VITAMIN B12: VITAMIN B 12: 467 pg/mL (ref 200–1100)

## 2016-08-16 LAB — PROTIME-INR
INR: 1
PROTHROMBIN TIME: 10.7 s (ref 9.0–11.5)

## 2016-08-16 NOTE — Addendum Note (Signed)
Addended by: Vicie Mutters R on: 08/16/2016 08:43 AM   Modules accepted: Orders

## 2016-08-18 NOTE — Progress Notes (Signed)
Pt aware of lab results & voiced understanding of those results.

## 2016-08-19 NOTE — Progress Notes (Signed)
Assessment and Plan:  Nephrotic syndrome/low serum protein with edema Lasix 80 mg daily, monitor weight, has referral with nephrology -     BASIC METABOLIC PANEL WITH GFR -     ANA -     C3 and C4 -     RPR -     Protein, urine, 24 hour - ACE - CPK - ANTI DNA - ANCA  Elevated LFTs Will monitor, strongly suggest stop drinking     Future Appointments Date Time Provider [place]  11/17/2016 3:45 PM Unk Pinto, MD [id number] None    HPI 75 y.o.male presents for follow up for edema. He had bilateral 2-3 + edema, he has had low albumin and proteinuria, started on lasix, here for follow up. We will get some labs for proteinuria and refer to nephrology.  He has only been on the lasix once a day and states he has not had any difference in his swelling, not urinating much. Will try to decrease alcohol.  No SOB, no CP.   Wt Readings from Last 3 Encounters:  08/22/16 171 lb 3.2 oz (77.7 kg)  08/15/16 170 lb (77.1 kg)  07/04/16 171 lb 12.8 oz (77.9 kg)     Past Medical History:  Diagnosis Date  . BPH (benign prostatic hyperplasia)   . Hyperlipidemia   . Hypertension   . Hypogonadism, male   . Pre-diabetes   . Prostatitis   . Thyroid disease      Allergies  Allergen Reactions  . Penicillins     REACTION: rash,swelling  . 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 Sulfonamide Derivatives     REACTION: itching,swelling    Current Outpatient Prescriptions on File Prior to Visit  Medication Sig  . Cholecalciferol (VITAMIN D-3) 1000 UNITS CAPS Take 8,000 Units by mouth daily.  . furosemide (LASIX) 40 MG tablet 1-2 pills a day for swelling  . labetalol (NORMODYNE) 200 MG tablet take 1/2-1 tablet by mouth twice a day for blood pressure  . latanoprost (XALATAN) 0.005 % ophthalmic solution at bedtime.   [name] levothyroxine (SYNTHROID, LEVOTHROID) 175 MCG tablet Please take 1.5 tablets by mouth Monday-Friday.  Please take 1  tablet on Saturday and Sunday (Patient taking differently: Please take 1.5 tablets by mouth 4 days a week and take 1 tablet on  Tues and Thurs.)  . lisinopril (PRINIVIL,ZESTRIL) 20 MG tablet Take 1 tablet (20 mg total) by mouth daily.  . meloxicam (MOBIC) 15 MG tablet Take 1/2 to 1 tab daily with food for pain and inflammation.  [name] PARoxetine (PAXIL) 20 MG tablet take 1 tablet by mouth once daily  . ranitidine (ZANTAC) 300 MG tablet Take 1 to 2 tablets daily for heartburn & reflux   No current facility-administered medications on file prior to visit.     ROS: all negative except above.   Physical Exam: Filed Weights   08/22/16 0948  Weight: 171 lb 3.2 oz (77.7 kg)   BP 128/76   Pulse 72   Temp (!) 97.2 F (36.2 C)   Resp 14   Ht 5\' 8"  (1.727 m)   Wt 171 lb 3.2 oz (77.7 kg)   SpO2 97%   BMI 26.03 kg/m  General Appearance: Well nourished, in no apparent distress. Eyes: PERRLA, EOMs, conjunctiva no swelling or erythema Sinuses: No Frontal/maxillary tenderness ENT/Mouth: Ext aud canals clear, TMs without erythema, bulging. No erythema, swelling, or exudate on post pharynx.  Tonsils not swollen or erythematous.  Hearing normal.  Neck: Supple, thyroid normal.  Respiratory: Respiratory effort normal, BS equal bilaterally without rales, rhonchi, wheezing or stridor.  Cardio: RRR with no MRGs. Brisk peripheral pulses 2-3+ edema.  Abdomen: Soft, + BS.  Non tender, no guarding, rebound, hernias, masses. Lymphatics: Non tender without lymphadenopathy.  Musculoskeletal: Full ROM, 5/5 strength, normal gait.  Skin: Warm, dry without rashes, lesions, ecchymosis.  Neuro: Cranial nerves intact. Normal muscle tone, no cerebellar symptoms. Sensation intact.  Psych: Awake and oriented X 3, normal affect.     Vicie Mutters, PA-C 10:19 AM [facility]

## 2016-08-22 ENCOUNTER — Other Ambulatory Visit: Payer: Self-pay | Admitting: Physician Assistant

## 2016-08-22 ENCOUNTER — Encounter: Payer: Self-pay | Admitting: Physician Assistant

## 2016-08-22 ENCOUNTER — Other Ambulatory Visit: Payer: Self-pay

## 2016-08-22 ENCOUNTER — Ambulatory Visit (INDEPENDENT_AMBULATORY_CARE_PROVIDER_SITE_OTHER): Payer: Medicare Other | Admitting: Physician Assistant

## 2016-08-22 VITALS — BP 128/76 | HR 72 | Temp 97.2°F | Resp 14 | Ht 68.0 in | Wt 171.2 lb

## 2016-08-22 DIAGNOSIS — I10 Essential (primary) hypertension: Secondary | ICD-10-CM

## 2016-08-22 DIAGNOSIS — E8809 Other disorders of plasma-protein metabolism, not elsewhere classified: Secondary | ICD-10-CM | POA: Diagnosis not present

## 2016-08-22 DIAGNOSIS — R609 Edema, unspecified: Secondary | ICD-10-CM

## 2016-08-22 DIAGNOSIS — R7989 Other specified abnormal findings of blood chemistry: Secondary | ICD-10-CM | POA: Diagnosis not present

## 2016-08-22 DIAGNOSIS — N049 Nephrotic syndrome with unspecified morphologic changes: Secondary | ICD-10-CM

## 2016-08-22 DIAGNOSIS — R945 Abnormal results of liver function studies: Secondary | ICD-10-CM

## 2016-08-22 NOTE — Patient Instructions (Signed)
Increase lasix 2 pills a day, can take at same time Decrease alcohol Elevate your feet above your heart Get compression stockings Weight self daily Call the office Friday and let us know your weight, how much urinating   Nephrotic Syndrome Nephrotic syndrome is a set of findings that show there is a problem with the kidneys. These findings include:  High levels of protein in the urine (proteinuria).  High blood pressure (hypertension).  Low levels of the protein albumin in the blood (hypoalbuminemia).  High levels of cholesterol (hyperlipidemia) and triglycerides (hypertriglyceridemia) in the blood.  Swelling (edema) of the face, abdomen, arms, and legs.  Nephrotic syndrome occurs when the kidneys' filters (glomeruli) are damaged. Glomeruli remove toxins and waste products from the bloodstream. As a result of damaged glomeruli, essential products such as proteins may also be removed from the bloodstream. Nephrotic syndrome results from the loss of proteins and other substances that the body needs. Nephrotic syndrome may increase your risk of further kidney damage and of health problems such as blood clots and infections. What are the causes? This condition may be caused by:  A kidney disease that damages the glomeruli, such as: ? Minimal change disease. ? Focal segmental glomerulosclerosis. ? Membranous nephropathy. ? Glomerulonephritis.  A condition or disease that affects other parts of the body (systemic), such as: ? Diabetes. ? Autoimmune diseases, such as lupus. ? Amyloidosis. ? Multiple myeloma. ? Some types of cancers. ? An infection, such as hepatitis C.  Certain medicines, such as: ? Nonsteroidal anti-inflammatory drugs (NSAIDs). ? Some anticancer drugs.  In some cases, the cause may not be known. What are the signs or symptoms? Symptoms of this condition include:  Edema.  Foamy urine.  Unexplained weight gain.  Loss of appetite.  In some cases, there  are no noticeable symptoms. How is this diagnosed? This condition is usually diagnosed with a dipstick urine test followed by a 24-hour urine test in which you collect all of the urine that you produce over a 24-hour period. If your test shows that you have this condition, more tests may be needed to find the cause. These may include blood, urine, imaging, or kidney biopsy tests. How is this treated? Treatment for this condition may include medicines to control symptoms or to prevent complications from occurring. These medicines may:  Decrease inflammation in the kidneys.  Lower blood pressure.  Lower cholesterol.  Reduce the blood's ability to clot.  Help control edema.  Further treatment will depend on the cause of the condition. Your health care provider will discuss treatment options with you. Follow these instructions at home:  Follow diet instructions from your health care provider. This may include changes to help manage edema or hypertension, such as limiting your intake of salt or fluids.  Take over-the-counter and prescription medicines only as told by your health care provider. ? Do not take any new over-the-counter medicines or nutritional supplements unless approved by your health care provider. Many medicines can make this condition worse. Doses may need to be adjusted.  Keep all follow-up visits as told by your health care provider. This is important. Contact a health care provider if:  Your symptoms do not go away as expected or you develop new symptoms.  You continue to gain weight.  You have increased swelling of the feet, ankles, or legs. Get help right away if:  You stop producing urine.  You have prolonged bleeding.  You have shortness of breath.  You have a severe headache.  You have severe weakness. This information is not intended to replace advice given to you by your health care provider. Make sure you discuss any questions you have with your  health care provider. Document Released: 12/25/2003 Document Revised: 01/25/2016 Document Reviewed: 01/25/2016 Elsevier Interactive Patient Education  [employer].

## 2016-08-23 ENCOUNTER — Other Ambulatory Visit: Payer: Self-pay | Admitting: Physician Assistant

## 2016-08-23 LAB — ANCA SCREEN W REFLEX TITER: ANCA SCREEN: NEGATIVE

## 2016-08-23 LAB — C3 AND C4
C3 Complement: 113 mg/dL (ref 82–185)
C4 COMPLEMENT: 32 mg/dL (ref 15–53)

## 2016-08-23 LAB — BASIC METABOLIC PANEL WITH GFR
BUN: 17 mg/dL (ref 7–25)
CHLORIDE: 104 mmol/L (ref 98–110)
CO2: 26 mmol/L (ref 20–31)
Calcium: 8.8 mg/dL (ref 8.6–10.3)
Creat: 0.85 mg/dL (ref 0.70–1.18)
GFR, Est African American: >89 mL/min (ref >=60)
GFR, Est Non African American: 86 mL/min (ref >=60)
Glucose, Bld: 93 mg/dL (ref 65–99)
POTASSIUM: 4.9 mmol/L (ref 3.5–5.3)
Sodium: 136 mmol/L (ref 135–146)

## 2016-08-23 LAB — SEDIMENTATION RATE: SED RATE: 18 mm/h (ref 0–20)

## 2016-08-23 LAB — CK: Total CK: 318 U/L — ABNORMAL HIGH (ref 44–196)

## 2016-08-23 LAB — RPR

## 2016-08-23 LAB — ANTI-DNA ANTIBODY, DOUBLE-STRANDED: ds DNA Ab: 1 IU/mL

## 2016-08-23 LAB — ANA: ANA: NEGATIVE

## 2016-08-24 LAB — PROTEIN, URINE, 24 HOUR
Protein, 24H Urine: 4973 mg/24 h — ABNORMAL HIGH (ref <150)
Protein, Urine: 255 mg/dL — ABNORMAL HIGH (ref 5–25)

## 2016-08-24 NOTE — Progress Notes (Signed)
Pt aware of lab results & voiced understanding of those results.

## 2016-08-25 LAB — ANGIOTENSIN CONVERTING ENZYME: Angiotensin-Converting Enzyme: 17 U/L (ref 9–67)

## 2016-08-26 ENCOUNTER — Other Ambulatory Visit: Payer: Self-pay | Admitting: Nephrology

## 2016-08-26 DIAGNOSIS — N049 Nephrotic syndrome with unspecified morphologic changes: Secondary | ICD-10-CM

## 2016-08-26 NOTE — Progress Notes (Signed)
LVM for pt to return office call for LAB results.

## 2016-09-06 NOTE — Progress Notes (Signed)
Pt aware of lab results & voiced understanding of those results.

## 2016-09-13 ENCOUNTER — Ambulatory Visit
Admission: RE | Admit: 2016-09-13 | Discharge: 2016-09-13 | Disposition: A | Discharge status: Home / Self Care | Payer: Medicare Other | Source: Ambulatory Visit | Attending: Nephrology | Admitting: Nephrology

## 2016-09-13 DIAGNOSIS — N049 Nephrotic syndrome with unspecified morphologic changes: Secondary | ICD-10-CM

## 2016-09-16 ENCOUNTER — Other Ambulatory Visit (HOSPITAL_COMMUNITY): Payer: Self-pay | Admitting: Nephrology

## 2016-09-16 DIAGNOSIS — N049 Nephrotic syndrome with unspecified morphologic changes: Secondary | ICD-10-CM

## 2016-09-23 ENCOUNTER — Other Ambulatory Visit: Payer: Self-pay | Admitting: Physician Assistant

## 2016-09-23 ENCOUNTER — Other Ambulatory Visit: Payer: Self-pay | Admitting: Radiology

## 2016-09-23 ENCOUNTER — Other Ambulatory Visit: Payer: Self-pay | Admitting: Student

## 2016-09-26 ENCOUNTER — Ambulatory Visit (HOSPITAL_COMMUNITY)
Admission: RE | Admit: 2016-09-26 | Discharge: 2016-09-26 | Disposition: A | Discharge status: Home / Self Care | Payer: Medicare Other | Source: Ambulatory Visit | Attending: Nephrology | Admitting: Nephrology

## 2016-09-26 ENCOUNTER — Other Ambulatory Visit: Payer: Self-pay | Admitting: Nephrology

## 2016-09-26 ENCOUNTER — Encounter (HOSPITAL_COMMUNITY): Payer: Self-pay

## 2016-09-26 DIAGNOSIS — R7303 Prediabetes: Secondary | ICD-10-CM | POA: Diagnosis not present

## 2016-09-26 DIAGNOSIS — N400 Enlarged prostate without lower urinary tract symptoms: Secondary | ICD-10-CM | POA: Insufficient documentation

## 2016-09-26 DIAGNOSIS — I10 Essential (primary) hypertension: Secondary | ICD-10-CM | POA: Diagnosis not present

## 2016-09-26 DIAGNOSIS — N049 Nephrotic syndrome with unspecified morphologic changes: Secondary | ICD-10-CM | POA: Diagnosis present

## 2016-09-26 DIAGNOSIS — E8581 Light chain (AL) amyloidosis: Secondary | ICD-10-CM | POA: Insufficient documentation

## 2016-09-26 DIAGNOSIS — Z882 Allergy status to sulfonamides status: Secondary | ICD-10-CM | POA: Insufficient documentation

## 2016-09-26 DIAGNOSIS — D472 Monoclonal gammopathy: Secondary | ICD-10-CM

## 2016-09-26 DIAGNOSIS — Z880 Allergy status to penicillin: Secondary | ICD-10-CM | POA: Insufficient documentation

## 2016-09-26 DIAGNOSIS — E785 Hyperlipidemia, unspecified: Secondary | ICD-10-CM | POA: Insufficient documentation

## 2016-09-26 DIAGNOSIS — N269 Renal sclerosis, unspecified: Secondary | ICD-10-CM | POA: Insufficient documentation

## 2016-09-26 DIAGNOSIS — Z87891 Personal history of nicotine dependence: Secondary | ICD-10-CM | POA: Insufficient documentation

## 2016-09-26 DIAGNOSIS — N261 Atrophy of kidney (terminal): Secondary | ICD-10-CM | POA: Insufficient documentation

## 2016-09-26 DIAGNOSIS — E079 Disorder of thyroid, unspecified: Secondary | ICD-10-CM | POA: Diagnosis not present

## 2016-09-26 LAB — CBC
HCT: 42.3 % (ref 39.0–52.0)
Hemoglobin: 15 g/dL (ref 13.0–17.0)
MCH: 32.8 pg (ref 26.0–34.0)
MCHC: 35.5 g/dL (ref 30.0–36.0)
MCV: 92.6 fL (ref 78.0–100.0)
PLATELETS: 302 10*3/uL (ref 150–400)
RBC: 4.57 MIL/uL (ref 4.22–5.81)
RDW: 12.8 % (ref 11.5–15.5)
WBC: 6.2 10*3/uL (ref 4.0–10.5)

## 2016-09-26 LAB — BASIC METABOLIC PANEL
Anion gap: 9 (ref 5–15)
BUN: 18 mg/dL (ref 6–20)
CALCIUM: 9.2 mg/dL (ref 8.9–10.3)
CO2: 27 mmol/L (ref 22–32)
CREATININE: 0.95 mg/dL (ref 0.61–1.24)
Chloride: 101 mmol/L (ref 101–111)
GFR calc non Af Amer: >60 mL/min (ref >60)
Glucose, Bld: 96 mg/dL (ref 65–99)
Potassium: 3.8 mmol/L (ref 3.5–5.1)
SODIUM: 137 mmol/L (ref 135–145)

## 2016-09-26 LAB — PROTIME-INR
INR: 0.91
PROTHROMBIN TIME: 12.2 s (ref 11.4–15.2)

## 2016-09-26 LAB — APTT: aPTT: 26 seconds (ref 24–36)

## 2016-09-26 MED ORDER — FENTANYL CITRATE (PF) 100 MCG/2ML IJ SOLN
INTRAMUSCULAR | Status: AC | PRN
  Administered 2016-09-26 (×2): 50 ug via INTRAVENOUS

## 2016-09-26 MED ORDER — HYDROCODONE-ACETAMINOPHEN 5-325 MG PO TABS: 1.0000 | ORAL_TABLET | ORAL | Status: DC | PRN

## 2016-09-26 MED ORDER — SODIUM CHLORIDE 0.9 % IV SOLN: INTRAVENOUS | Status: DC

## 2016-09-26 MED ORDER — MIDAZOLAM HCL 2 MG/2ML IJ SOLN
INTRAMUSCULAR | Status: AC | PRN
  Administered 2016-09-26 (×2): 1 mg via INTRAVENOUS

## 2016-09-26 MED ORDER — MIDAZOLAM HCL 2 MG/2ML IJ SOLN
INTRAMUSCULAR | Status: AC
  Filled 2016-09-26: qty 2

## 2016-09-26 MED ORDER — LIDOCAINE HCL (PF) 1 % IJ SOLN
INTRAMUSCULAR | Status: AC
  Filled 2016-09-26: qty 30

## 2016-09-26 MED ORDER — FENTANYL CITRATE (PF) 100 MCG/2ML IJ SOLN
INTRAMUSCULAR | Status: AC
  Filled 2016-09-26: qty 2

## 2016-09-26 NOTE — Discharge Instructions (Addendum)

## 2016-09-26 NOTE — Sedation Documentation (Signed)
Pt complains of pain at procedure site 

## 2016-09-26 NOTE — Sedation Documentation (Signed)
Patient is resting comfortably. 

## 2016-09-26 NOTE — Sedation Documentation (Signed)
Pt  tolerated procedure well.

## 2016-09-26 NOTE — Sedation Documentation (Signed)
Pt resting at this time with no complaints of pain or discomfort. Report called to Pete Glatter RN, awaiting transport to travel with pt to [facility]

## 2016-09-26 NOTE — H&P (Signed)
Chief Complaint: nephrotic syndrome   Referring Physician:Dr. Pearson Grippe  Supervising Physician: Markus Daft  Patient Status: Gregory Galvan - Out-pt  HPI: Gregory Galvan is a 76 y.o. male who has a history of HTN and BPH who has recently been found to have nephrotic syndrome.  He is followed by Dr. Joelyn Oms who has requested this biopsy.  The patient denies any other complaints currently.  He is here for his procedure.  Past Medical History:  Past Medical History:  Diagnosis Date  . BPH (benign prostatic hyperplasia)   . Hyperlipidemia   . Hypertension   . Hypogonadism, male   . Pre-diabetes   . Prostatitis   . Thyroid disease     Past Surgical History:  Past Surgical History:  Procedure Laterality Date  . APPENDECTOMY      Family History:  Family History  Problem Relation Age of Onset  . Cancer Mother        breast  . Alzheimer's disease Mother   . Hypertension Father   . Heart disease Father   . Lymphoma Father   . Hypertension Brother   . Heart disease Brother   . Heart disease Brother   . Hypertension Brother   . Diabetes Brother     Social History:  reports that he quit smoking about 2 years ago. He has never used smokeless tobacco. He reports that he drinks about 16.8 oz of alcohol per week . He reports that he does not use drugs.  Allergies:  Allergies  Allergen Reactions  . 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 Sulfonamide Derivatives Itching and Swelling  . Penicillins Swelling and Rash    Medications: Medications reviewed in epic  Please HPI for pertinent positives, otherwise complete 10 system ROS negative.  Mallampati Score: MD Evaluation Airway: WNL Heart: WNL Abdomen: WNL Chest/ Lungs: WNL ASA  Classification: 2 Mallampati/Airway Score: One  Physical Exam: BP 127/81   Pulse 85   Temp 98.2 F (36.8 C) (Oral)   Resp 16   Ht _0  (1.727 m)   Wt 171 lb (77.6 kg)   SpO2 100%   BMI 26.00  kg/m  Body mass index is 26 kg/m. General: pleasant, WD, WN white male who is laying in bed in NAD HEENT: head is normocephalic, atraumatic.  Sclera are noninjected.  PERRL.  Ears and nose without any masses or lesions.  Mouth is pink and moist Heart: regular, rate, and rhythm.  Normal s1,s2. No obvious murmurs, gallops, or rubs noted.  Palpable radial and pedal pulses bilaterally Lungs: CTAB, no wheezes, rhonchi, or rales noted.  Respiratory effort nonlabored Abd: soft, NT, ND, +BS, no masses, hernias, or organomegaly Psych: A&Ox3 with an appropriate affect.   Labs: Results for orders placed or performed during the hospital encounter of 09/26/16 (from the past 48 hour(s))  APTT upon arrival     Status: None   Collection Time: 09/26/16  6:00 AM  Result Value Ref Range   aPTT 26 24 - 36 seconds  CBC upon arrival     Status: None   Collection Time: 09/26/16  6:00 AM  Result Value Ref Range   WBC 6.2 4.0 - 10.5 K/uL   RBC 4.57 4.22 - 5.81 MIL/uL   Hemoglobin 15.0 13.0 - 17.0 g/dL   HCT 42.3 39.0 - 52.0 %   MCV 92.6 78.0 - 100.0 fL   MCH 32.8 26.0 - 34.0 pg   MCHC 35.5 30.0 -  36.0 g/dL   RDW 12.8 11.5 - 15.5 %   Platelets 302 150 - 400 K/uL  Protime-INR upon arrival     Status: None   Collection Time: 09/26/16  6:00 AM  Result Value Ref Range   Prothrombin Time 12.2 11.4 - 15.2 seconds   INR 4.61   Basic metabolic panel     Status: None   Collection Time: 09/26/16  6:00 AM  Result Value Ref Range   Sodium 137 135 - 145 mmol/L   Potassium 3.8 3.5 - 5.1 mmol/L   Chloride 101 101 - 111 mmol/L   CO2 27 22 - 32 mmol/L   Glucose, Bld 96 65 - 99 mg/dL   BUN 18 6 - 20 mg/dL   Creatinine, Ser 0.95 0.61 - 1.24 mg/dL   Calcium 9.2 8.9 - 10.3 mg/dL   GFR calc non Af Amer >60 >60 mL/min   GFR calc Af Amer >60 >60 mL/min    Comment: (NOTE) The eGFR has been calculated using the CKD EPI equation. This calculation has not been validated in all clinical situations. eGFR's persistently  <60 mL/min signify possible Chronic Kidney Disease.    Anion gap 9 5 - 15    Imaging: No results found.  Assessment/Plan 1. Nephrotic syndrome  We will plan to proceed with a random renal biopsy.  His labs and vitals have been reviewed.  His BP is normal today.  Risks and benefits discussed with the patient including, but not limited to bleeding, infection, damage to adjacent structures or low yield requiring additional tests. All of the patient's questions were answered, patient is agreeable to proceed. Consent signed and in chart.  Thank you for this interesting consult.  I greatly enjoyed meeting [name] and look forward to participating in their care.  A copy of this report was sent to the requesting provider on this date.  Electronically Signed: Henreitta Cea 09/26/2016, 8:03 AM   I spent a total of  30 Minutes   in face to face in clinical consultation, greater than 50% of which was counseling/coordinating care for nephrotic syndrome

## 2016-09-26 NOTE — Sedation Documentation (Signed)
Vital signs stable. 

## 2016-09-26 NOTE — Sedation Documentation (Signed)
Pt is resting at this time with no complaints, procedure started.

## 2016-09-26 NOTE — Procedures (Signed)
US guided core biopsies of left kidney.  2 cores from lower pole.  Minimal blood loss and no immediate complication.

## 2016-09-29 ENCOUNTER — Other Ambulatory Visit: Payer: Self-pay | Admitting: Oncology

## 2016-10-02 ENCOUNTER — Other Ambulatory Visit: Payer: Self-pay | Admitting: Oncology

## 2016-10-04 ENCOUNTER — Ambulatory Visit
Admission: RE | Admit: 2016-10-04 | Discharge: 2016-10-04 | Disposition: A | Discharge status: Home / Self Care | Payer: Medicare Other | Source: Ambulatory Visit | Attending: Nephrology | Admitting: Nephrology

## 2016-10-04 DIAGNOSIS — N049 Nephrotic syndrome with unspecified morphologic changes: Secondary | ICD-10-CM

## 2016-10-04 DIAGNOSIS — D472 Monoclonal gammopathy: Secondary | ICD-10-CM

## 2016-10-05 ENCOUNTER — Encounter (HOSPITAL_COMMUNITY): Payer: Self-pay

## 2016-10-05 ENCOUNTER — Telehealth: Payer: Self-pay | Admitting: Hematology

## 2016-10-05 NOTE — Telephone Encounter (Signed)
Appt has been scheduled for the pt to see Dr. Irene Limbo on 8/24. Pt aware to arrive 30 minutes early.

## 2016-10-07 ENCOUNTER — Encounter: Payer: Self-pay | Admitting: Hematology

## 2016-10-07 ENCOUNTER — Telehealth: Payer: Self-pay | Admitting: Hematology

## 2016-10-07 ENCOUNTER — Other Ambulatory Visit: Payer: Self-pay | Admitting: *Deleted

## 2016-10-07 ENCOUNTER — Ambulatory Visit (HOSPITAL_BASED_OUTPATIENT_CLINIC_OR_DEPARTMENT_OTHER): Payer: Medicare Other

## 2016-10-07 ENCOUNTER — Ambulatory Visit (HOSPITAL_BASED_OUTPATIENT_CLINIC_OR_DEPARTMENT_OTHER): Payer: Medicare Other | Admitting: Hematology

## 2016-10-07 ENCOUNTER — Ambulatory Visit: Payer: Medicare Other | Admitting: Hematology

## 2016-10-07 VITALS — BP 127/70 | HR 89 | Temp 98.7°F | Resp 18 | Ht 68.0 in | Wt 171.3 lb

## 2016-10-07 DIAGNOSIS — E8581 Light chain (AL) amyloidosis: Secondary | ICD-10-CM

## 2016-10-07 DIAGNOSIS — N059 Unspecified nephritic syndrome with unspecified morphologic changes: Secondary | ICD-10-CM | POA: Diagnosis not present

## 2016-10-07 LAB — COMPREHENSIVE METABOLIC PANEL
ALBUMIN: 2.7 g/dL — AB (ref 3.5–5.0)
ALK PHOS: 119 U/L (ref 40–150)
ALT: 30 U/L (ref 0–55)
ANION GAP: 6 meq/L (ref 3–11)
AST: 47 U/L — ABNORMAL HIGH (ref 5–34)
BILIRUBIN TOTAL: 0.43 mg/dL (ref 0.20–1.20)
BUN: 15 mg/dL (ref 7.0–26.0)
CALCIUM: 9.5 mg/dL (ref 8.4–10.4)
CO2: 28 meq/L (ref 22–29)
CREATININE: 1 mg/dL (ref 0.7–1.3)
Chloride: 101 mEq/L (ref 98–109)
EGFR: 78 mL/min/{1.73_m2} — AB (ref >90)
Glucose: 104 mg/dl (ref 70–140)
Potassium: 4.6 mEq/L (ref 3.5–5.1)
Sodium: 135 mEq/L — ABNORMAL LOW (ref 136–145)
TOTAL PROTEIN: 5.5 g/dL — AB (ref 6.4–8.3)

## 2016-10-07 LAB — CBC & DIFF AND RETIC
BASO%: 0.4 % (ref 0.0–2.0)
BASOS ABS: 0 10*3/uL (ref 0.0–0.1)
EOS ABS: 0 10*3/uL (ref 0.0–0.5)
EOS%: 0.6 % (ref 0.0–7.0)
HEMATOCRIT: 41.3 % (ref 38.4–49.9)
HEMOGLOBIN: 14.5 g/dL (ref 13.0–17.1)
IMMATURE RETIC FRACT: 1.1 % — AB (ref 3.00–10.60)
LYMPH#: 1.3 10*3/uL (ref 0.9–3.3)
LYMPH%: 26 % (ref 14.0–49.0)
MCH: 33.6 pg — ABNORMAL HIGH (ref 27.2–33.4)
MCHC: 35.1 g/dL (ref 32.0–36.0)
MCV: 95.8 fL (ref 79.3–98.0)
MONO#: 0.6 10*3/uL (ref 0.1–0.9)
MONO%: 13 % (ref 0.0–14.0)
NEUT#: 3 10*3/uL (ref 1.5–6.5)
NEUT%: 60 % (ref 39.0–75.0)
PLATELETS: 274 10*3/uL (ref 140–400)
RBC: 4.31 10*6/uL (ref 4.20–5.82)
RDW: 12.8 % (ref 11.0–14.6)
RETIC CT ABS: 51.72 10*3/uL (ref 34.80–93.90)
Retic %: 1.2 % (ref 0.80–1.80)
WBC: 4.9 10*3/uL (ref 4.0–10.3)

## 2016-10-07 MED ORDER — ALPRAZOLAM 0.5 MG PO TABS: ORAL_TABLET | ORAL | 0 refills | Status: DC

## 2016-10-07 NOTE — Telephone Encounter (Signed)
Spoke with patient and scheduled appts for 9/5.  Messaged Darlena regarding the echo.

## 2016-10-07 NOTE — Telephone Encounter (Signed)
Scheduled the patient's echo (authorization # [id number]).  Called patient and informed them of the time and place.

## 2016-10-07 NOTE — Progress Notes (Signed)
[name]    HEMATOLOGY/ONCOLOGY CONSULTATION NOTE  Date of Service: 10/07/2016  Patient Care Team: Unk Pinto, MD as PCP - General (Internal Medicine) Inda Castle, MD as Consulting Physician (Gastroenterology) Pearson Grippe MD -nephrology   CHIEF COMPLAINTS/PURPOSE OF CONSULTATION:  Renal AL Amyloidosis  HISTORY OF PRESENTING ILLNESS:  Gregory Galvan is a wonderful 76 y.o. male who has been referred to [place] by Dr .Unk Pinto, MD /Dr Pearson Grippe MD for evaluation and management of newly diagnosed Renal AL Amyloidosis.  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   He recently developed lower extremity swelling and increasing proteinuria and was referred to nephrology and was seen by Dr. Pearson Grippe at [facility].   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  SPEP showed a small M spike of 0.2 g/dL IFE showing IgG lambda monoclonal paraproteinemia. Increase in serum lambda light chain with a decreased kappa/Lambda light chain ratio.  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 Congo red stain positive.  Patient was referred to [place] for further evaluation and management of his newly diagnosed AL Amyloidosis .  Patient notes his leg swelling is better with diuretics.  Notes no fever no chills no night sweats no unexpected weight loss. No focal bone pains. Labs have not shown any overt anemia or hypercalcemia. His creatinine is relatively normal at 1. Hypoalbuminemia with albumin level of 2.7 g/dL.   MEDICAL HISTORY:  Past Medical History:  Diagnosis Date  . BPH (benign prostatic hyperplasia)   . Hyperlipidemia   . Hypertension   . Hypogonadism, male   . Pre-diabetes   .  Prostatitis   . 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 No surgical procedure performed. History of hypertension for 30 years on ACE inhibitor, beta blocker and Lasix Hypothyroidism   SURGICAL HISTORY: Past Surgical History:  Procedure Laterality Date  . APPENDECTOMY      SOCIAL HISTORY: Social History   Social History  . Marital status: Divorced    Spouse name: N/A  . Number of children: N/A  . Years of education: N/A   Occupational History  . Not on file.   Social History Main Topics  . Smoking status: Former Smoker    Quit date: 12/22/2013  . Smokeless tobacco: Never Used  . Alcohol use 16.8 oz/week    28 Standard drinks or equivalent per week     Comment: drinks wine  . Drug use: No  . Sexual activity: Not on file   Other Topics Concern  . Not on file   Social History Narrative  . No narrative on file    FAMILY HISTORY: Family History  Problem Relation Age of Onset  . Cancer Mother        breast  . Alzheimer's disease Mother   . Hypertension Father   . Heart disease Father   . Lymphoma Father   . Hypertension Brother   . Heart disease Brother   . Heart disease Brother   . Hypertension Brother   . Diabetes Brother     ALLERGIES:  is allergic to ppd [tuberculin purified protein derivative]; sulfonamide derivatives; and penicillins.  MEDICATIONS:  Current Outpatient Prescriptions  Medication Sig Dispense  Refill  . Cholecalciferol (VITAMIN D) 2000 units tablet Take 8,000 Units by mouth daily.    . furosemide (LASIX) 40 MG tablet 1-2 pills a day for swelling (Patient taking differently: Take 80 mg by mouth daily. ) 60 tablet 11  . latanoprost (XALATAN) 0.005 % ophthalmic solution Place 1 drop into both eyes at bedtime.     [name] levothyroxine (SYNTHROID, LEVOTHROID) 175 MCG tablet Please take 1.5 tablets by mouth Monday-Friday.  Please take 1 tablet on Saturday and Sunday (Patient taking  differently: Take 175-262.5 mcg by mouth See admin instructions. Please take 1.5 tablets by mouth 4 days a week and take 1 tablet on  Tues and Thurs.) 114 tablet 1  . lisinopril (PRINIVIL,ZESTRIL) 40 MG tablet Take 40 mg by mouth daily.    . Magnesium 100 MG TABS Take 300 mg by mouth daily.    . milk thistle 175 MG tablet Take 175 mg by mouth daily.    . Multiple Vitamins-Minerals (MULTIVITAMIN WITH MINERALS) tablet Take 1 tablet by mouth daily.    [name] PARoxetine (PAXIL) 20 MG tablet Take 10 mg by mouth daily.    . Turmeric 500 MG CAPS Take 1,500 mg elemental calcium/kg/hr by mouth daily.    [name] ALPRAZolam (XANAX) 0.5 MG tablet Take 1/2 to 1 tablet at bedtime PRN. 30 tablet 0   No current facility-administered medications for this visit.     REVIEW OF SYSTEMS:    10 Point review of Systems was done is negative except as noted above.  PHYSICAL EXAMINATION: ECOG PERFORMANCE STATUS: 1 - Symptomatic but completely ambulatory  . Vitals:   10/07/16 1048  BP: 127/70  Pulse: 89  Resp: 18  Temp: 98.7 F (37.1 C)  SpO2: 100%   Filed Weights   10/07/16 1048  Weight: 171 lb 4.8 oz (77.7 kg)   .Body mass index is 26.05 kg/m.  GENERAL:alert, in no acute distress and comfortable SKIN: no acute rashes, no significant lesions EYES: conjunctiva are pink and non-injected, sclera anicteric OROPHARYNX: MMM, no exudates, no oropharyngeal erythema or ulceration NECK: supple, no JVD LYMPH:  no palpable lymphadenopathy in the cervical, axillary or inguinal regions LUNGS: clear to auscultation b/l with normal respiratory effort HEART: regular rate & rhythm ABDOMEN:  normoactive bowel sounds , non tender, not distended. No palpable hepatosplenomegaly Extremity: no pedal edema PSYCH: alert & oriented x 3 with fluent speech NEURO: no focal motor/sensory deficits  LABORATORY DATA:  I have reviewed the data as listed  . CBC Latest Ref Rng & Units 10/07/2016 09/26/2016 08/15/2016  WBC 4.0 - 10.3  10e3/uL 4.9 6.2 6.4  Hemoglobin 13.0 - 17.1 g/dL 14.5 15.0 14.4  Hematocrit 38.4 - 49.9 % 41.3 42.3 42.3  Platelets 140 - 400 10e3/uL 274 302 281    . CMP Latest Ref Rng & Units 10/07/2016 10/07/2016 09/26/2016  Glucose 70 - 140 mg/dl 104 - 96  BUN 7.0 - 26.0 mg/dL 15.0 - 18  Creatinine 0.7 - 1.3 mg/dL 1.0 - 0.95  Sodium 136 - 145 mEq/L 135(L) - 137  Potassium 3.5 - 5.1 mEq/L 4.6 - 3.8  Chloride 101 - 111 mmol/L - - 101  CO2 22 - 29 mEq/L 28 - 27  Calcium 8.4 - 10.4 mg/dL 9.5 - 9.2  Total Protein 6.0 - 8.5 g/dL 5.5(L) 5.1(L) -  Total Bilirubin 0.20 - 1.20 mg/dL 0.43 - -  Alkaline Phos 40 - 150 U/L 119 - -  AST 5 - 34 U/L 47(H) - -  ALT  0 - 55 U/L 30 - -         Component     Latest Ref Rng & Units 08/22/2016 10/07/2016  C3 Complement     82 - 185 mg/dL 113   C4 Complement     15 - 53 mg/dL 32   Anit Nuclear Antibody(ANA)     NEGATIVE NEG   RPR     NON REAC NON REAC   Angiotensin-Converting Enzyme     9 - 67 U/L 17   ds DNA Ab     IU/mL 1   ANCA SCREEN      Negative   Hep C Virus Ab     0.0 - 0.9 s/co ratio  0.1   Component     Latest Ref Rng & Units 10/07/2016  Hep C Virus Ab     0.0 - 0.9 s/co ratio 0.1  Hepatitis B Surface Ag     Negative Negative  HIV Screen 4th Generation wRfx     Non Reactive Non Reactive                                   *[place]*                  *[facility]*                          [address]                        [place], [place] [address]                            [phone]  ------------------------------------------------------------------- Transthoracic Echocardiography  Patient:    Gregory Galvan, Gregory Galvan MR #:       [id number] Study Date: 10/12/2016 Gender:     M Age:        11 Height:     172.7 cm Weight:     77.7 kg BSA:        1.94 m^2 Pt. Status: Room:   SONOGRAPHER  Dustin Flock, RCS  PERFORMING   [facility], Outpatient  ATTENDING    [place], [place], [place]  Kishore  REFERRING    [place], [place] Kishore  cc:  ------------------------------------------------------------------- LV EF: 65% -   70%  ------------------------------------------------------------------- Indications:      Pre-op evaluation V728.1.  ------------------------------------------------------------------- History:   PMH:  Renal Amyloidosis, Hypertension, pre-chemo.  ------------------------------------------------------------------- Study Conclusions  - Left ventricle: The cavity size was normal. Wall thickness was   increased in a pattern of moderate LVH. Systolic function was   vigorous. The estimated ejection fraction was in the range of 65%   to 70%. Wall motion was normal; there were no regional wall   motion abnormalities. Doppler parameters are consistent with   abnormal left ventricular relaxation (grade 1 diastolic   dysfunction). - Mitral valve: There was trivial regurgitation. - Left atrium: The atrium was mildly dilated.    RADIOGRAPHIC STUDIES:  I have personally reviewed the radiological images as listed and agreed with the findings in the report. US Renal  Result Date: 09/13/2016 CLINICAL DATA:  Nephrotic syndrome. EXAM: RENAL / URINARY TRACT ULTRASOUND COMPLETE COMPARISON:  No prior. FINDINGS: Right Kidney: Length: 12.2 cm. Mild increase echogenicity.  No mass lesion. No hydronephrosis. Left Kidney: Length: 11.4 cm. Mild increase echogenicity. No mass lesion. No hydronephrosis. Bladder: Bladder is distended. A septation or fold appears to be present within the of bladder. This may be related to any bladder diverticulum. Large postvoid residual IMPRESSION: . 1. Mild increased echogenicity both kidneys consistent with chronic medical renal disease. 2.  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 Electronically Signed   By: Marcello Moores  Register   On: 09/13/2016 16:10   US Biopsy  Result Date: 09/26/2016 INDICATION: Nephrotic-nephritic  syndrome EXAM: ULTRASOUND-GUIDED BIOPSY OF LEFT KIDNEY MEDICATIONS: None. ANESTHESIA/SEDATION: Moderate (conscious) sedation was employed during this procedure. A total of Versed 100 mg and Fentanyl 2.0 mcg was administered intravenously. Moderate Sedation Time: 16 minutes. The patient's level of consciousness and vital signs were monitored continuously by radiology nursing throughout the procedure under my direct supervision. FLUOROSCOPY TIME:  None COMPLICATIONS: None immediate. PROCEDURE: Informed written consent was obtained from the patient after a thorough discussion of the procedural risks, benefits and alternatives. All questions were addressed. A timeout was performed prior to the initiation of the procedure. Kidneys were evaluated with ultrasound. Left kidney was selected for biopsy. The left flank was prepped and draped in a sterile fashion. Skin was anesthetized with 1% lidocaine. Using ultrasound guidance, 16 gauge core needle was directed into the left kidney lower pole and core biopsy obtained. Specimen placed in saline. A second ultrasound-guided core biopsy was obtained with a 16 gauge needle. Specimen placed in saline. Bandage placed over the puncture site. Ultrasound images were taken and saved for this procedure. FINDINGS: Core biopsies obtained from left kidney lower pole. No significant bleeding or hematoma formation following the core biopsies. IMPRESSION: Successful ultrasound-guided core biopsies from the left kidney lower pole. Electronically Signed   By: Markus Daft M.D.   On: 09/26/2016 18:11   Dg Bone Survey Met  Result Date: 10/04/2016 CLINICAL DATA:  Nephrotic syndrome.  Monoclonal paraproteinemia EXAM: METASTATIC BONE SURVEY COMPARISON:  None. FINDINGS: Multiple images of the axial and appendicular skeleton are provided. No aggressive lytic or sclerotic osseous lesion. 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 Mild dextrocurvature of the thoracolumbar  spine. Degenerative disc disease with severe disc height loss at L1-2, L4-5 and L5-S1 with bilateral facet arthropathy at L4-5 and L5-S1. Mild osteoarthritis of the right glenohumeral joint. Moderate osteoarthritis of the left glenohumeral joint. Moderate arthropathy of the left acromioclavicular joint. Lungs are clear. No focal consolidation, pleural effusion or pneumothorax. Stable cardiomediastinal silhouette. No acute fracture or dislocation. 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 Prior resection of the trapezoid bilaterally. IMPRESSION: No aggressive lytic or sclerotic osseous lesion of the axial and appendicular skeleton. 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  -No hypercalcemia, normal creatinine level, no anemia. -No evidence of bone lesions on whole body skeletal survey.  Echo shows normal ejection fraction with grade 1 diastolic dysfunction.  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  Hepatitis C, HIV and hepatitis B negative. Other workup as per nephrologist negative. Patient follows  with Dr. Pearson Grippe  [facility]. PLAN  -I discussed in detail all the lab findings, new diagnosis of AL amyloidosis, natural history, prognosis, treatment options with the patient in details. -He was given significant amount of patient education material to read about his condition. -An echo was ordered and done and showed normal ejection fraction of 65-70% that does not show any overt cardiac amyloidosis involvement. -We'll repeat 24-hour  UPEP -Chemotherapy counseling for CyBorD regimen. -Discussed with the treatment and its potential side effects. -We'll plan to start dexamethasone weekly pending intiation of CyBorD -BM Bx to r/o concurrent multiple myeloma which may be present in about 10% of people. Low likelihood in this situation.  . Orders Placed This Encounter  Procedures  . 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    Answer:   [facility]    Order Specific Question:   Radiology Contrast Protocol - do NOT remove file path    Answer:   \\[email or web address]  . 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    Answer:   [facility]    Order Specific Question:   Radiology Contrast Protocol - do NOT remove file path    Answer:   \\[email or web address]  . Multiple Myeloma Panel (SPEP&IFE w/QIG)    Standing Status:   Future    Number of Occurrences:   1    Standing Expiration Date:   10/07/2017  . Kappa/lambda light chains    Standing Status:   Future    Number of Occurrences:   1    Standing Expiration Date:   11/11/2017  . 24-Hr Ur  UPEP/UIFE/Light Chains/TP    Standing Status:   Future    Number of Occurrences:   1    Standing Expiration Date:   10/07/2017  . CBC & Diff and Retic    Standing Status:   Future    Number of Occurrences:   1    Standing Expiration Date:   10/07/2017  . Comprehensive metabolic panel    Standing Status:   Future    Number of Occurrences:   1    Standing Expiration Date:   10/07/2017  . Hepatitis C antibody    Standing Status:   Future    Number of Occurrences:   1    Standing Expiration Date:   10/07/2017  . Hepatitis B surface antigen    Standing Status:   Future    Number of Occurrences:   1    Standing Expiration Date:   10/07/2017  .  HIV antibody    Standing Status:   Future    Number of Occurrences:   1    Standing Expiration Date:   10/07/2017  . ECHOCARDIOGRAM COMPLETE    Patient with proven renal AL Amyloidosis r/o Cardiac amyloidosis    Standing Status:   Future    Number of Occurrences:   1    Standing Expiration Date:   01/07/2018    Order Specific Question:   Where should this test be performed    Answer:   [place]    Order Specific Question:   Perflutren DEFINITY (image enhancing agent) should be administered unless hypersensitivity or allergy exist    Answer:   Administer Perflutren    Order Specific Question:   Expected Date:    Answer:   ASAP   Labs today ECHO in 1 week CT bone marrow biopsy in 1 week RTC with Dr Irene Limbo in 2 weeks  All of the patients questions were answered with apparent satisfaction. The patient knows to call the clinic with any problems, questions or concerns.  I spent 50 minutes counseling the patient face to face. The total time spent in the appointment was 80 minutes and more than 50% was on counseling and direct patient cares.    Sullivan Lone MD [place] AAHIVMS [facility] Hematology/Oncology Physician [facility]  (Office):       [phone] (Work cell):  [phone] (Fax):           [phone]  10/07/2016 10:22  AM

## 2016-10-07 NOTE — Patient Instructions (Signed)
Thank you for choosing [facility] to provide your oncology and hematology care.  To afford each patient quality time with our providers, please arrive 30 minutes before your scheduled appointment time.  If you arrive late for your appointment, you may be asked to reschedule.  We strive to give you quality time with our providers, and arriving late affects you and other patients whose appointments are after yours.   If you are a no show for multiple scheduled visits, you may be dismissed from the clinic at the providers discretion.    Again, thank you for choosing [facility], our hope is that these requests will decrease the amount of time that you wait before being seen by our physicians.  ______________________________________________________________________  Should you have questions after your visit to the [facility], please contact our office at [phone] between the hours of 8:30 and 4:30 p.m.    Voicemails left after 4:30p.m will not be returned until the following business day.    For prescription refill requests, please have your pharmacy contact us directly.  Please also try to allow 48 hours for prescription requests.    Please contact the scheduling department for questions regarding scheduling.  For scheduling of procedures such as PET scans, CT scans, MRI, Ultrasound, etc please contact central scheduling at [phone].    Resources For Cancer Patients and Caregivers:   Oncolink.org:  A wonderful resource for patients and healthcare providers for information regarding your disease, ways to tract your treatment, what to expect, etc.     American Cancer Society:  800-227-2345  Can help patients locate various types of support and financial assistance  Cancer Care: 1-800-813-HOPE (4673) Provides financial assistance, online support groups, medication/co-pay assistance.    [place] DSS:  336-641-3447 Where to apply for food  stamps, Medicaid, and utility assistance  Medicare Rights Center: 800-333-4114 Helps people with Medicare understand their rights and benefits, navigate the Medicare system, and secure the quality healthcare they deserve  SCAT: 336-333-6589 [employer]'s shared-ride transportation service for eligible riders who have a disability that prevents them from riding the fixed route bus.    For additional information on assistance programs please contact our social worker:   Grier Hock/Abigail Elmore:  [phone]            

## 2016-10-08 LAB — HEPATITIS C ANTIBODY: HEP C VIRUS AB: 0.1 {s_co_ratio} (ref 0.0–0.9)

## 2016-10-08 LAB — HEPATITIS B SURFACE ANTIGEN: HEP B S AG: NEGATIVE

## 2016-10-08 LAB — HIV ANTIBODY (ROUTINE TESTING W REFLEX): HIV Screen 4th Generation wRfx: NONREACTIVE

## 2016-10-10 LAB — KAPPA/LAMBDA LIGHT CHAINS
IG KAPPA FREE LIGHT CHAIN: 9.6 mg/L (ref 3.3–19.4)
Ig Lambda Free Light Chain: 103.2 mg/L — ABNORMAL HIGH (ref 5.7–26.3)
KAPPA/LAMBDA FLC RATIO: 0.09 — AB (ref 0.26–1.65)

## 2016-10-11 ENCOUNTER — Telehealth: Payer: Self-pay | Admitting: *Deleted

## 2016-10-11 LAB — MULTIPLE MYELOMA PANEL, SERUM
ALBUMIN/GLOB SERPL: 1.3 (ref 0.7–1.7)
ALPHA2 GLOB SERPL ELPH-MCNC: 0.9 g/dL (ref 0.4–1.0)
Albumin SerPl Elph-Mcnc: 2.8 g/dL — ABNORMAL LOW (ref 2.9–4.4)
Alpha 1: 0.2 g/dL (ref 0.0–0.4)
B-Globulin SerPl Elph-Mcnc: 0.7 g/dL (ref 0.7–1.3)
Gamma Glob SerPl Elph-Mcnc: 0.4 g/dL (ref 0.4–1.8)
Globulin, Total: 2.3 g/dL (ref 2.2–3.9)
IGA/IMMUNOGLOBULIN A, SERUM: 80 mg/dL (ref 61–437)
IGM (IMMUNOGLOBIN M), SRM: 26 mg/dL (ref 15–143)
IgG, Qn, Serum: 540 mg/dL — ABNORMAL LOW (ref 700–1600)
M Protein SerPl Elph-Mcnc: 0.2 g/dL — ABNORMAL HIGH
TOTAL PROTEIN: 5.1 g/dL — AB (ref 6.0–8.5)

## 2016-10-11 NOTE — Telephone Encounter (Signed)
"  This is Charlena Cross, [employer] [phone]) calling for 10-07-2016 office note.  Dr. Pearson Grippe referred patient to Dr. Irene Limbo, would like visit notes faxed to [phone]."   Routing call information to collaborative nurse and provider for review.  Further patient communication through collaborative nurse.

## 2016-10-12 ENCOUNTER — Ambulatory Visit (HOSPITAL_COMMUNITY)
Admission: RE | Admit: 2016-10-12 | Discharge: 2016-10-12 | Disposition: A | Discharge status: Home / Self Care | Payer: Medicare Other | Source: Ambulatory Visit | Attending: Hematology | Admitting: Hematology

## 2016-10-12 ENCOUNTER — Telehealth: Payer: Self-pay

## 2016-10-12 ENCOUNTER — Other Ambulatory Visit: Payer: Self-pay | Admitting: Internal Medicine

## 2016-10-12 ENCOUNTER — Encounter (HOSPITAL_COMMUNITY): Payer: Self-pay

## 2016-10-12 DIAGNOSIS — I10 Essential (primary) hypertension: Secondary | ICD-10-CM | POA: Insufficient documentation

## 2016-10-12 DIAGNOSIS — E8581 Light chain (AL) amyloidosis: Secondary | ICD-10-CM

## 2016-10-12 MED ORDER — PROCHLORPERAZINE MALEATE 10 MG PO TABS: 10.0000 mg | ORAL_TABLET | Freq: Four times a day (QID) | ORAL | 1 refills | Status: DC | PRN

## 2016-10-12 MED ORDER — ACYCLOVIR 400 MG PO TABS: 400.0000 mg | ORAL_TABLET | Freq: Two times a day (BID) | ORAL | 3 refills | Status: DC

## 2016-10-12 MED ORDER — DEXAMETHASONE 4 MG PO TABS: 8.0000 mg | ORAL_TABLET | Freq: Every day | ORAL | 1 refills | Status: DC

## 2016-10-12 MED ORDER — ONDANSETRON HCL 8 MG PO TABS: 8.0000 mg | ORAL_TABLET | Freq: Two times a day (BID) | ORAL | 1 refills | Status: DC | PRN

## 2016-10-12 NOTE — Progress Notes (Signed)
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

## 2016-10-12 NOTE — Progress Notes (Signed)
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

## 2016-10-12 NOTE — Telephone Encounter (Signed)
Unable to reach pt. Left VM on mobile phone. Dr. Irene Limbo would like to proceed with one of the options he and the pt had discussed at the last appt. Labs, doctor visit, and infusion to be scheduled 9/6 for first-time cytoxan/velcade with dexamethasone. Preceded by chemo education class.   Treatment to be scheduled out for 8 weeks. Scheduling message sent for chemo education class and infusion appt on 9/6. Doctor visit and lab scheduled. Okay per MD for labs to follow visit.   Updated OV note from 10/07/16 faxed to [facility]. Confirmed fax receipt [phone].

## 2016-10-12 NOTE — Progress Notes (Signed)
  Echocardiogram 2D Echocardiogram has been performed.  Gregory Galvan 10/12/2016, 10:51 AM

## 2016-10-12 NOTE — Progress Notes (Signed)
ALERT: A disease instance has been permanently removed from this patient's pathway record and replaced with a new disease instance. Information on the new disease instance will be transmitted in a separate message.  Disease Being Removed: [Other Dx]  Reason for Removal: Reason not listed 

## 2016-10-13 ENCOUNTER — Other Ambulatory Visit: Payer: Self-pay | Admitting: Radiology

## 2016-10-13 ENCOUNTER — Telehealth: Payer: Self-pay | Admitting: *Deleted

## 2016-10-13 NOTE — Telephone Encounter (Signed)
Pt called stating he missed phone call yesterday and is returning the call.  Per phone note by Aldona Bar, RN, pt called to inform that Dr. Irene Limbo would like to see him 9/6 with labs/chemo edu/initiation of cytoxan and velcade.  Pt verbalized understanding.  Pt notified of time of first apt on 9/6.

## 2016-10-14 ENCOUNTER — Other Ambulatory Visit: Payer: Self-pay | Admitting: General Surgery

## 2016-10-14 ENCOUNTER — Other Ambulatory Visit: Payer: Self-pay | Admitting: Radiology

## 2016-10-14 ENCOUNTER — Telehealth: Payer: Self-pay

## 2016-10-14 ENCOUNTER — Encounter: Payer: Self-pay | Admitting: Hematology

## 2016-10-14 NOTE — Progress Notes (Signed)
Received PA request for Ondansetron.  Submitted through Cover My Meds.   Gregory Galvan (Key: [id number])   Your information has been submitted to [place]. [employer] will review the request and notify you of the determination decision directly, typically within 3 business days of your submission and once all necessary information is received. You will also receive your request decision electronically. To check for an update later, open the request again from your dashboard. If [employer] has not responded within the specified timeframe or if you have any questions about your PA submission, contact [facility] directly at [facility]) [phone] or ([place]) [phone].

## 2016-10-14 NOTE — Progress Notes (Signed)
Received call from [facility] Medicare(Anita) to advise of approval for Ondansetron 10/14/16-10/14/17. They will also send via fax.  Called [facility](Samantha) to advise of approval. She states they need the approval number.

## 2016-10-14 NOTE — Telephone Encounter (Signed)
Pt preference to be seen on 9/11 for all appts. Will check with MD on Tuesday to see if pt can be seen during lunch break.

## 2016-10-18 ENCOUNTER — Encounter: Payer: Self-pay | Admitting: *Deleted

## 2016-10-18 ENCOUNTER — Ambulatory Visit (HOSPITAL_COMMUNITY)
Admission: RE | Admit: 2016-10-18 | Discharge: 2016-10-18 | Disposition: A | Discharge status: Home / Self Care | Payer: Medicare Other | Source: Ambulatory Visit | Attending: Hematology | Admitting: Hematology

## 2016-10-18 ENCOUNTER — Telehealth: Payer: Self-pay | Admitting: Hematology

## 2016-10-18 ENCOUNTER — Encounter (HOSPITAL_COMMUNITY): Payer: Self-pay

## 2016-10-18 ENCOUNTER — Telehealth: Payer: Self-pay

## 2016-10-18 DIAGNOSIS — C9000 Multiple myeloma not having achieved remission: Secondary | ICD-10-CM | POA: Diagnosis not present

## 2016-10-18 DIAGNOSIS — E8581 Light chain (AL) amyloidosis: Secondary | ICD-10-CM | POA: Diagnosis present

## 2016-10-18 LAB — CBC WITH DIFFERENTIAL/PLATELET
BASOS ABS: 0 10*3/uL (ref 0.0–0.1)
BASOS PCT: 0 %
EOS ABS: 0.1 10*3/uL (ref 0.0–0.7)
EOS PCT: 2 %
HCT: 38 % — ABNORMAL LOW (ref 39.0–52.0)
HEMOGLOBIN: 13.6 g/dL (ref 13.0–17.0)
LYMPHS ABS: 1.7 10*3/uL (ref 0.7–4.0)
Lymphocytes Relative: 31 %
MCH: 33.3 pg (ref 26.0–34.0)
MCHC: 35.8 g/dL (ref 30.0–36.0)
MCV: 93.1 fL (ref 78.0–100.0)
Monocytes Absolute: 0.8 10*3/uL (ref 0.1–1.0)
Monocytes Relative: 15 %
NEUTROS PCT: 52 %
Neutro Abs: 2.8 10*3/uL (ref 1.7–7.7)
PLATELETS: 257 10*3/uL (ref 150–400)
RBC: 4.08 MIL/uL — AB (ref 4.22–5.81)
RDW: 12.8 % (ref 11.5–15.5)
WBC: 5.4 10*3/uL (ref 4.0–10.5)

## 2016-10-18 LAB — PROTIME-INR
INR: 0.91
PROTHROMBIN TIME: 12.1 s (ref 11.4–15.2)

## 2016-10-18 LAB — APTT: APTT: 23 s — AB (ref 24–36)

## 2016-10-18 MED ORDER — SODIUM CHLORIDE 0.9 % IV SOLN
INTRAVENOUS | Status: DC
  Administered 2016-10-18: 08:00:00 via INTRAVENOUS

## 2016-10-18 MED ORDER — FENTANYL CITRATE (PF) 100 MCG/2ML IJ SOLN
INTRAMUSCULAR | Status: AC | PRN
  Administered 2016-10-18 (×2): 50 ug via INTRAVENOUS

## 2016-10-18 MED ORDER — MIDAZOLAM HCL 2 MG/2ML IJ SOLN
INTRAMUSCULAR | Status: AC | PRN
  Administered 2016-10-18 (×2): 1 mg via INTRAVENOUS

## 2016-10-18 MED ORDER — FENTANYL CITRATE (PF) 100 MCG/2ML IJ SOLN
INTRAMUSCULAR | Status: AC
  Filled 2016-10-18: qty 4

## 2016-10-18 MED ORDER — MIDAZOLAM HCL 2 MG/2ML IJ SOLN
INTRAMUSCULAR | Status: AC
  Filled 2016-10-18: qty 4

## 2016-10-18 NOTE — Discharge Instructions (Signed)
°Needle Biopsy of the Bone, Care After °Refer to this sheet in the next few weeks. These instructions provide you with information about caring for yourself after your procedure. Your health care provider may also give you more specific instructions. Your treatment has been planned according to current medical practices, but problems sometimes occur. Call your health care provider if you have any problems or questions after your procedure. °What can I expect after the procedure? °After your procedure, it is common to have soreness or tenderness at the puncture site. °Follow these instructions at home: °· Take over-the-counter and prescription medicines only as told by your health care provider. °· Bathe and shower as told by your health care provider. °· Follow instructions from your health care provider about: °? How to take care of your puncture site. °? When and how you should change your bandage (dressing). °? When you should remove your dressing. °· Check your puncture site every day for signs of infection. Watch for: °? Redness, swelling, or worsening pain. °? Fluid, blood, or pus. °· Return to your normal activities as told by your health care provider. °· Keep all follow-up visits as told by your health care provider. This is important. °Contact a health care provider if: °· You have redness, swelling, or worsening pain at the site of your puncture. °· You have fluid, blood, or pus coming from your puncture site. °· You have a fever. °· You have persistent nausea or vomiting. °Get help right away if: °· You develop a rash. °· You have difficulty breathing. °This information is not intended to replace advice given to you by your health care provider. Make sure you discuss any questions you have with your health care provider. °Document Released: 08/20/2004 Document Revised: 07/09/2015 Document Reviewed: 03/10/2014 °Elsevier Interactive Patient Education © 2018 Elsevier Inc. °Moderate Conscious Sedation,  Adult, Care After °These instructions provide you with information about caring for yourself after your procedure. Your health care provider may also give you more specific instructions. Your treatment has been planned according to current medical practices, but problems sometimes occur. Call your health care provider if you have any problems or questions after your procedure. °What can I expect after the procedure? °After your procedure, it is common: °· To feel sleepy for several hours. °· To feel clumsy and have poor balance for several hours. °· To have poor judgment for several hours. °· To vomit if you eat too soon. ° °Follow these instructions at home: °For at least 24 hours after the procedure: ° °· Do not: °? Participate in activities where you could fall or become injured. °? Drive. °? Use heavy machinery. °? Drink alcohol. °? Take sleeping pills or medicines that cause drowsiness. °? Make important decisions or sign legal documents. °? Take care of children on your own. °· Rest. °Eating and drinking °· Follow the diet recommended by your health care provider. °· If you vomit: °? Drink water, juice, or soup when you can drink without vomiting. °? Make sure you have little or no nausea before eating solid foods. °General instructions °· Have a responsible adult stay with you until you are awake and alert. °· Take over-the-counter and prescription medicines only as told by your health care provider. °· If you smoke, do not smoke without supervision. °· Keep all follow-up visits as told by your health care provider. This is important. °Contact a health care provider if: °· You keep feeling nauseous or you keep vomiting. °· You feel light-headed. °·   You develop a rash. °· You have a fever. °Get help right away if: °· You have trouble breathing. °This information is not intended to replace advice given to you by your health care provider. Make sure you discuss any questions you have with your health care  provider. °Document Released: 11/21/2012 Document Revised: 07/06/2015 Document Reviewed: 05/23/2015 °Elsevier Interactive Patient Education © 2018 Elsevier Inc. ° °

## 2016-10-18 NOTE — H&P (Signed)
Referring Physician(s): Brunetta Genera  Supervising Physician: Sandi Mariscal  Patient Status:  [facility] OP  Chief Complaint:  "I'm having a bone marrow biopsy"  Subjective: Patient familiar to IR service from prior left random renal biopsy on 09/26/16. He has a history of newly diagnosed renal AL amyloidosis/nephrotic syndrome and presents today for CT-guided bone marrow biopsy to rule out concurrent myeloma. He currently denies fever, headache, chest pain, dyspnea, cough, back pain, nausea, vomiting or bleeding. He does have some intermittent abdominal discomfort. Past Medical History:  Diagnosis Date  . BPH (benign prostatic hyperplasia)   . Hyperlipidemia   . Hypertension   . Hypogonadism, male   . Pre-diabetes   . Prostatitis   . Thyroid disease    Past Surgical History:  Procedure Laterality Date  . APPENDECTOMY        Allergies: Ppd [tuberculin purified protein derivative]; Sulfonamide derivatives; and Penicillins  Medications: Prior to Admission medications   Medication Sig Start Date End Date Taking? Authorizing Provider  Cholecalciferol (VITAMIN D) 2000 units tablet Take 8,000 Units by mouth daily.   Yes [provider]  furosemide (LASIX) 40 MG tablet 1-2 pills a day for swelling Patient taking differently: Take 80 mg by mouth daily.  08/15/16 08/15/17 Yes Vicie Mutters, PA-C  latanoprost (XALATAN) 0.005 % ophthalmic solution Place 1 drop into both eyes at bedtime.  01/31/13  Yes [provider]  levothyroxine (SYNTHROID, LEVOTHROID) 175 MCG tablet take 1 and 1/2 tablets by mouth once daily or as directed 10/12/16  Yes Unk Pinto, MD  lisinopril (PRINIVIL,ZESTRIL) 40 MG tablet Take 40 mg by mouth daily.   Yes [provider]  Magnesium 100 MG TABS Take 300 mg by mouth daily.   Yes [provider]  milk thistle 175 MG tablet Take 175 mg by mouth daily.   Yes [provider]  Multiple Vitamins-Minerals (MULTIVITAMIN  WITH MINERALS) tablet Take 1 tablet by mouth daily.   Yes [provider]  PARoxetine (PAXIL) 20 MG tablet Take 10 mg by mouth daily.   Yes [provider]  Turmeric 500 MG CAPS Take 1,500 mg elemental calcium/kg/hr by mouth daily.   Yes [provider]  acyclovir (ZOVIRAX) 400 MG tablet Take 1 tablet (400 mg total) by mouth 2 (two) times daily. 10/12/16   Brunetta Genera, MD  ALPRAZolam Duanne Moron) 0.5 MG tablet Take 1/2 to 1 tablet at bedtime PRN. 10/07/16   Unk Pinto, MD  dexamethasone (DECADRON) 4 MG tablet Take 2 tablets (8 mg total) by mouth daily. Start the day after chemotherapy for 2 days. 10/12/16   Brunetta Genera, MD  ondansetron (ZOFRAN) 8 MG tablet Take 1 tablet (8 mg total) by mouth 2 (two) times daily as needed for refractory nausea / vomiting. Start on day 3 after chemo. 10/12/16   Brunetta Genera, MD  prochlorperazine (COMPAZINE) 10 MG tablet Take 1 tablet (10 mg total) by mouth every 6 (six) hours as needed (Nausea or vomiting). 10/12/16   Brunetta Genera, MD     Vital Signs: BP 123/86   Pulse 94   Temp 98.4 F (36.9 C) (Oral)   Resp 18   Ht '5\' 8"'$  (1.727 m)   Wt 168 lb 4 oz (76.3 kg)   SpO2 98%   BMI 25.58 kg/m   Physical Exam awake, alert. Chest clear to auscultation bilaterally. Heart with regular rate and rhythm. Abdomen soft, positive bowel sounds, nontender. Lower extremities with no significant edema.  Imaging:  No results found.  Labs:  CBC:  Recent Labs  08/15/16 1610 09/26/16 0600 10/07/16 1227 10/18/16 0740  WBC 6.4 6.2 4.9 5.4  HGB 14.4 15.0 14.5 13.6  HCT 42.3 42.3 41.3 38.0*  PLT 281 302 274 257    COAGS:  Recent Labs  08/15/16 1610 09/26/16 0600 10/18/16 0740  INR 1.0 0.91 0.91  APTT  --  26 23*    BMP:  Recent Labs  05/04/16 1652 08/15/16 1610 08/22/16 1033 09/26/16 0600 10/07/16 1227  NA 140 135 136 137 135*  K 4.5 4.4 4.9 3.8 4.6  CL 105 101 104 101  --   CO2 '27 25 26 27  28  '$ GLUCOSE 98 103* 93 96 104  BUN '15 13 17 18 '$ 15.0  CALCIUM 9.0 9.0 8.8 9.2 9.5  CREATININE 0.88 0.89 0.85 0.95 1.0  GFRNONAA 85 84 86 >60  --   GFRAA >89 >89 >89 >60  --     LIVER FUNCTION TESTS:  Recent Labs  02/04/16 1545 05/04/16 1652 08/15/16 1610 10/07/16 1227  BILITOT 0.3 0.4 0.3 0.43  AST 59* 40* 46* 47*  ALT '27 24 26 30  '$ ALKPHOS 108 93 101 119  PROT 5.3* 4.8* 4.9* 5.5*  5.1*  ALBUMIN 3.2* 2.9* 2.8* 2.7*    Assessment and Plan:  Pt with history of newly diagnosed renal AL amyloidosis/nephrotic syndrome; presents today for CT-guided bone marrow biopsy to rule out concurrent myeloma.Risks and benefits discussed with the patient including, but not limited to bleeding, infection, damage to adjacent structures or low yield requiring additional tests.All of the patient's questions were answered, patient is agreeable to proceed.Consent signed and in chart.      Electronically Signed: D. Rowe Robert, PA-C 10/18/2016, 8:21 AM   I spent a total of 25 minutes at the the patient's bedside AND on the patient's hospital floor or unit, greater than 50% of which was counseling/coordinating care for CT-guided bone marrow biopsy

## 2016-10-18 NOTE — Telephone Encounter (Signed)
Scheduling message sent to have pt lab and doctor appt changed to 9/11 preceding infusion appt at 1400. Dr. Irene Limbo okay to see pt at 1340 during lunch break. Pt also needing chemo education prior to 9/11 appts. Details included in scheduling message.

## 2016-10-18 NOTE — Telephone Encounter (Signed)
R/s appt per 9/4 sch message - left message with appt dates and time.

## 2016-10-18 NOTE — Procedures (Signed)
Pre-procedure Diagnosis: Evaluate for myeloma Post-procedure Diagnosis: Same  Technically successful CT guided bone marrow aspiration and biopsy of left iliac crest.   Complications: None Immediate  EBL: None  SignedSandi Mariscal Pager: [phone] 10/18/2016, 9:35 AM

## 2016-10-18 NOTE — Telephone Encounter (Signed)
Routed via EPIC to Provider in-basket.

## 2016-10-19 ENCOUNTER — Ambulatory Visit: Payer: Medicare Other | Admitting: Hematology

## 2016-10-19 ENCOUNTER — Ambulatory Visit (INDEPENDENT_AMBULATORY_CARE_PROVIDER_SITE_OTHER): Payer: Medicare Other | Admitting: Internal Medicine

## 2016-10-19 ENCOUNTER — Encounter: Payer: Self-pay | Admitting: Internal Medicine

## 2016-10-19 VITALS — BP 124/84 | HR 84 | Temp 97.5°F | Resp 18 | Ht 68.0 in | Wt 169.6 lb

## 2016-10-19 DIAGNOSIS — R300 Dysuria: Secondary | ICD-10-CM | POA: Diagnosis not present

## 2016-10-19 DIAGNOSIS — N401 Enlarged prostate with lower urinary tract symptoms: Secondary | ICD-10-CM

## 2016-10-19 LAB — UPEP/UIFE/LIGHT CHAINS/TP, 24-HR UR
% BETA, URINE: 9.4 %
ALBUMIN, U: 78.2 %
ALPHA 1 URINE: 7.6 %
ALPHA-2-GLOBULIN, U: 3.3 %
FREE KAPPA LT CHAINS, UR: 17.3 mg/L (ref 1.35–24.19)
Free Lambda Lt Chains,Ur: 49.8 mg/L — ABNORMAL HIGH (ref 0.24–6.66)
GAMMA GLOBULIN URINE: 1.6 %
Kappa/Lambda Ratio,U: 0.35 — ABNORMAL LOW (ref 2.04–10.37)
M-SPIKE, %: 6.4 % — ABNORMAL HIGH
M-SPIKE, MG/24 HR: 271 mg/(24.h) — AB
PROTEIN UR: 165.8 mg/dL

## 2016-10-19 NOTE — Progress Notes (Signed)
Subjective:    Patient ID: Gregory Galvan, male    DOB: 1940/10/13, 76 y.o.   MRN: [id number]  HPI  Patient is a very nice 76 yo single WM recently referred to Nephrology for recent onset of Nephrotic Syndrome and had a kidney bx which led to consultation with Dr Irene Limbo and a dx of "AL Amyloidosis" and he just had a BM Bx and is scheduled to begin Chemotx in 1 week. Patient reports that he generally feels well.     Patient had remote hx/o chronic prostatitis and presents today specifically for c/o N  2-3 x and LUTS with a change in ejaculate to only scant amounts of a "clear fluid" and some intermittent dysuria. Denies purulent or discolored discharge otherwise.   Medication Sig  . acyclovir  400 MG Take 1 tab 2 x daily.  [name] ALPRAZolam 0.5 MG  Take 1/2 to 1 tablet at bedtime PRN.  [name] VITAMIN D 2000 units  Take 8,000 Units  daily.  [name] dexamethasone  4 MG tablet Take 2 tab (8 mg)  daily the day after chemotherapy for 2 days.  . Furosemide 40 MG tablet  Take 80 mg  daily  . XALATAN  ophth soln Place 1 drop into both eyes at bedtime.   [name] levothyroxine  175 MCG  take 1 and 1/2 tab  daily   . Magnesium 100 MG  Take 300 mg  daily.  . milk thistle 175 MG  Take 175 mg by mouth daily.  . Multiple Vitamins-Minerals  Take 1 tab daily.  . Ondansetron 8 MG  Take 1 tab 2 x daily as needed N/V- day 3 after chemo.  [name] PARoxetine  20 MG  Take 10 mg  daily.  . prochlorperazine  10 MG  Take 1 tablet (10 mg total) by mouth every 6 (six) hours as needed (Nausea or vomiting).  . Turmeric 500 MG  Take 1,500 mg elemental calcium/kg/hr by mouth daily.  [name] lisinopril 40 MG Take 40 mg by mouth daily.   Allergies  Allergen Reactions  . 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 Sulfonamide Derivatives Itching and Swelling  . Penicillins Swelling and Rash   Past Medical History:  Diagnosis Date  . BPH (benign prostatic hyperplasia)   . Hyperlipidemia   . Hypertension   .  Hypogonadism, male   . Pre-diabetes   . Prostatitis   . Thyroid disease    Past Surgical History:  Procedure Laterality Date  . APPENDECTOMY     Review of Systems  10 point systems review negative except as above.    Objective:   Physical Exam   BP 124/84   Pulse 84   Temp (!) 97.5 F (36.4 C)   Resp 18   Ht 5\' 8"  (1.727 m)   Wt 169 lb 9.6 oz (76.9 kg)   BMI 25.79 kg/m   HEENT - WNL. Neck - supple.  Chest - Clear equal BS. Cor - Nl HS. RRR w/o sig M. + edema. GU  - DRE finds Prostate 1-2+ enlarged -  smooth, sl soft & tender w/o rectal masses evident and hemoccult is neg.  MS- FROM w/o deformities.  Gait Nl. Neuro -  Nl w/o focal abnormalities.     Assessment & Plan:   1. Benign localized prostatic hyperplasia with lower urinary tract symptoms (LUTS)  - PSA  2. Dysuria  - Urine Culture - Urinalysis w microscopic + reflex cultur  -  disposition pending labs & C/S.

## 2016-10-20 ENCOUNTER — Encounter: Payer: Self-pay | Admitting: *Deleted

## 2016-10-20 ENCOUNTER — Other Ambulatory Visit: Payer: Medicare Other

## 2016-10-20 ENCOUNTER — Ambulatory Visit: Payer: Medicare Other | Admitting: Hematology

## 2016-10-20 LAB — URINALYSIS W MICROSCOPIC + REFLEX CULTURE
Bacteria, UA: NONE SEEN /HPF
Bilirubin Urine: NEGATIVE
GLUCOSE, UA: NEGATIVE
HYALINE CAST: NONE SEEN /LPF
Hgb urine dipstick: NEGATIVE
Ketones, ur: NEGATIVE
Leukocyte Esterase: NEGATIVE
NITRITES URINE, INITIAL: NEGATIVE
Specific Gravity, Urine: 1.01 (ref 1.001–1.03)
Squamous Epithelial / LPF: NONE SEEN /HPF (ref <=5)
WBC, UA: NONE SEEN /HPF (ref 0–5)
pH: 7.5 (ref 5.0–8.0)

## 2016-10-20 LAB — URINE CULTURE
MICRO NUMBER:: 80973632
RESULT: NO GROWTH
SPECIMEN QUALITY: ADEQUATE

## 2016-10-20 LAB — NO CULTURE INDICATED

## 2016-10-20 LAB — PSA: PSA: 0.5 ng/mL (ref <=4.0)

## 2016-10-24 ENCOUNTER — Telehealth: Payer: Self-pay

## 2016-10-24 ENCOUNTER — Telehealth: Payer: Self-pay | Admitting: Hematology

## 2016-10-24 NOTE — Progress Notes (Signed)
[name]    HEMATOLOGY/ONCOLOGY FOLLOW UP NOTE  Date of Service: .10/25/2016 Patient Care Team: Lucky Cowboy, MD as PCP - General (Internal Medicine) Louis Meckel, MD as Consulting Physician (Gastroenterology) Arita Miss, MD as Attending Physician (Nephrology) Sabra Heck MD -nephrology   CHIEF COMPLAINTS/PURPOSE OF CONSULTATION:  Follow up Renal AL Amyloidosis   HISTORY OF PRESENTING ILLNESS:  Gregory Galvan is a wonderful 76 y.o. male who has been referred to [place] by Dr .Lucky Cowboy, MD /Dr Sabra Heck MD for evaluation and management of newly diagnosed Renal AL Amyloidosis.  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   He recently developed lower extremity swelling and increasing proteinuria and was referred to nephrology and was seen by Dr. Sabra Heck at [facility].   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  SPEP showed a small M spike of 0.2 g/dL IFE showing IgG lambda monoclonal paraproteinemia. Increase in serum lambda light chain with a decreased kappa/Lambda light chain ratio.  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 [place] red stain positive.  Patient was referred to [place] for further evaluation and management of his newly diagnosed AL Amyloidosis .  Patient notes his leg swelling is better with diuretics.  Notes no fever no chills no night sweats no unexpected weight loss. No focal bone pains. Labs have not shown any overt anemia or hypercalcemia. His creatinine is relatively normal at 0.8. Hypoalbuminemia with albumin level of 2.7 g/dL.  INTERVAL HISTORY Gregory Galvan returns to clinic today for follow up and treatment.  He reports to feeling fine. He has 2-3  glasses of wine a night. He should not drink on days he receive therapy and try to reduce down to 1 glass daily. He has completed his chemo-counseling and had additional questions that were answered in details. ECHO and bone marrow biopsy results discussed in details. Patient feels ready to proceed with planned treatment.  MEDICAL HISTORY:  Past Medical History:  Diagnosis Date  . BPH (benign prostatic hyperplasia)   . Hyperlipidemia   . Hypertension   . Hypogonadism, male   . Pre-diabetes   . Prostatitis   . 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 No surgical procedure performed. History of hypertension for 30 years on ACE inhibitor, beta blocker and Lasix Hypothyroidism   SURGICAL HISTORY: Past Surgical History:  Procedure Laterality Date  . APPENDECTOMY      SOCIAL HISTORY: Social History   Social History  . Marital status: Divorced    Spouse name: N/A  . Number of children: N/A  . Years of education: N/A   Occupational History  . Not on file.   Social History Main Topics  . Smoking status: Former Smoker    Quit date: 12/22/2013  . Smokeless tobacco: Never Used  . Alcohol use 16.8 oz/week    28 Standard drinks or equivalent per week     Comment: drinks wine  . Drug use: No  . Sexual activity: Not on file   Other Topics Concern  . Not on file   Social History Narrative  . No narrative on file    FAMILY HISTORY: Family History  Problem Relation Age of Onset  .  Cancer Mother        breast  . Alzheimer's disease Mother   . Hypertension Father   . Heart disease Father   . Lymphoma Father   . Hypertension Brother   . Heart disease Brother   . Heart disease Brother   . Hypertension Brother   . Diabetes Brother     ALLERGIES:  is allergic to ppd [tuberculin purified protein derivative]; sulfonamide derivatives; and penicillins.  MEDICATIONS:  Current Outpatient Prescriptions  Medication Sig  Dispense Refill  . acyclovir (ZOVIRAX) 400 MG tablet Take 1 tablet (400 mg total) by mouth 2 (two) times daily. 60 tablet 3  . ALPRAZolam (XANAX) 0.5 MG tablet Take 1/2 to 1 tablet at bedtime PRN. 30 tablet 0  . Cholecalciferol (VITAMIN D) 2000 units tablet Take 8,000 Units by mouth daily.    [name] dexamethasone (DECADRON) 4 MG tablet Take 2 tablets (8 mg total) by mouth daily. Start the day after chemotherapy for 2 days. 30 tablet 1  . furosemide (LASIX) 40 MG tablet 1-2 pills a day for swelling (Patient taking differently: Take 80 mg by mouth daily. ) 60 tablet 11  . latanoprost (XALATAN) 0.005 % ophthalmic solution Place 1 drop into both eyes at bedtime.     [name] levothyroxine (SYNTHROID, LEVOTHROID) 175 MCG tablet take 1 and 1/2 tablets by mouth once daily or as directed 135 tablet 1  . lisinopril (PRINIVIL,ZESTRIL) 20 MG tablet Take 20 mg by mouth daily.  0  . Magnesium 100 MG TABS Take 300 mg by mouth daily.    . milk thistle 175 MG tablet Take 175 mg by mouth daily.    . Multiple Vitamins-Minerals (MULTIVITAMIN WITH MINERALS) tablet Take 1 tablet by mouth daily.    . ondansetron (ZOFRAN) 8 MG tablet Take 1 tablet (8 mg total) by mouth 2 (two) times daily as needed for refractory nausea / vomiting. Start on day 3 after chemo. 30 tablet 1  . PARoxetine (PAXIL) 20 MG tablet Take 10 mg by mouth daily.    . prochlorperazine (COMPAZINE) 10 MG tablet Take 1 tablet (10 mg total) by mouth every 6 (six) hours as needed (Nausea or vomiting). 30 tablet 1  . Turmeric 500 MG CAPS Take 1,500 mg elemental calcium/kg/hr by mouth daily.     No current facility-administered medications for this visit.     REVIEW OF SYSTEMS:    10 Point review of Systems was done is negative except as noted above.  PHYSICAL EXAMINATION:  ECOG PERFORMANCE STATUS: 1 - Symptomatic but completely ambulatory  . Vitals:   10/25/16 1304  BP: 116/66  Pulse: 99  Resp: 20  Temp: 98.5 F (36.9 C)  SpO2: 100%   Filed Weights    10/25/16 1304  Weight: 173 lb 12.8 oz (78.8 kg)   .Body mass index is 26.43 kg/m.  GENERAL:alert, in no acute distress and comfortable SKIN: no acute rashes, no significant lesions EYES: conjunctiva are pink and non-injected, sclera anicteric OROPHARYNX: MMM, no exudates, no oropharyngeal erythema or ulceration NECK: supple, no JVD LYMPH:  no palpable lymphadenopathy in the cervical, axillary or inguinal regions LUNGS: clear to auscultation b/l with normal respiratory effort HEART: regular rate & rhythm ABDOMEN:  normoactive bowel sounds , non tender, not distended. No palpable hepatosplenomegaly Extremity: no pedal edema PSYCH: alert & oriented x 3 with fluent speech NEURO: no focal motor/sensory deficits  LABORATORY DATA:  I have reviewed the data as listed  . CBC Latest Ref Rng & Units 11/01/2016  10/25/2016 10/18/2016  WBC 4.0 - 10.3 10e3/uL 6.0 5.1 5.4  Hemoglobin 13.0 - 17.1 g/dL 13.8 14.4 13.6  Hematocrit 38.4 - 49.9 % 39.7 42.2 38.0(L)  Platelets 140 - 400 10e3/uL 262 248 257    . CMP Latest Ref Rng & Units 11/01/2016 10/25/2016 10/25/2016  Glucose 70 - 140 mg/dl 124 142(H) -  BUN 7.0 - 26.0 mg/dL 23.8 19.5 -  Creatinine 0.7 - 1.3 mg/dL 0.9 0.8 -  Sodium 136 - 145 mEq/L 132(L) 134(L) -  Potassium 3.5 - 5.1 mEq/L 4.5 4.3 -  Chloride 101 - 111 mmol/L - - -  CO2 22 - 29 mEq/L 24 21(L) -  Calcium 8.4 - 10.4 mg/dL 8.6 8.9 -  Total Protein 6.4 - 8.3 g/dL 5.1(L) 5.2(L) 4.9(L)  Total Bilirubin 0.20 - 1.20 mg/dL 0.33 0.38 -  Alkaline Phos 40 - 150 U/L 101 118 -  AST 5 - 34 U/L 31 41(H) -  ALT 0 - 55 U/L 30 30 -         Component     Latest Ref Rng & Units 08/22/2016 10/07/2016  C3 Complement     82 - 185 mg/dL 113   C4 Complement     15 - 53 mg/dL 32   Anit Nuclear Antibody(ANA)     NEGATIVE NEG   RPR     NON REAC NON REAC   Angiotensin-Converting Enzyme     9 - 67 U/L 17   ds DNA Ab     IU/mL 1   ANCA SCREEN      Negative   Hep C Virus Ab     0.0 - 0.9 s/co  ratio  0.1   Component     Latest Ref Rng & Units 10/07/2016  Hep C Virus Ab     0.0 - 0.9 s/co ratio 0.1  Hepatitis B Surface Ag     Negative Negative  HIV Screen 4th Generation wRfx     Non Reactive Non Reactive                                   *[place]*                  *[facility]*                          [address]                        [place], [place] [address]                            [phone]  ------------------------------------------------------------------- Transthoracic Echocardiography  Patient:    Tsutomu, Barfoot MR #:       [id number] Study Date: 10/12/2016 Gender:     M Age:        45 Height:     172.7 cm Weight:     77.7 kg BSA:        1.94 m^2 Pt. Status: Room:   SONOGRAPHER  Dustin Flock, RCS  PERFORMING   [facility], Outpatient  ATTENDING    [place], [place], Gautam Kishore  REFERRING    [place], [place] Kishore  cc:  ------------------------------------------------------------------- LV EF: 65% -   70%  -------------------------------------------------------------------  Indications:      Pre-op evaluation V728.1.  ------------------------------------------------------------------- History:   PMH:  Renal Amyloidosis, Hypertension, pre-chemo.  ------------------------------------------------------------------- Study Conclusions  - Left ventricle: The cavity size was normal. Wall thickness was   increased in a pattern of moderate LVH. Systolic function was   vigorous. The estimated ejection fraction was in the range of 65%   to 70%. Wall motion was normal; there were no regional wall   motion abnormalities. Doppler parameters are consistent with   abnormal left ventricular relaxation (grade 1 diastolic   dysfunction). - Mitral valve: There was trivial regurgitation. - Left atrium: The atrium was mildly dilated.    RADIOGRAPHIC STUDIES:  I have personally reviewed the  radiological images as listed and agreed with the findings in the report. US Biopsy  Result Date: 09/26/2016 INDICATION: Nephrotic-nephritic syndrome EXAM: ULTRASOUND-GUIDED BIOPSY OF LEFT KIDNEY MEDICATIONS: None. ANESTHESIA/SEDATION: Moderate (conscious) sedation was employed during this procedure. A total of Versed 100 mg and Fentanyl 2.0 mcg was administered intravenously. Moderate Sedation Time: 16 minutes. The patient's level of consciousness and vital signs were monitored continuously by radiology nursing throughout the procedure under my direct supervision. FLUOROSCOPY TIME:  None COMPLICATIONS: None immediate. PROCEDURE: Informed written consent was obtained from the patient after a thorough discussion of the procedural risks, benefits and alternatives. All questions were addressed. A timeout was performed prior to the initiation of the procedure. Kidneys were evaluated with ultrasound. Left kidney was selected for biopsy. The left flank was prepped and draped in a sterile fashion. Skin was anesthetized with 1% lidocaine. Using ultrasound guidance, 16 gauge core needle was directed into the left kidney lower pole and core biopsy obtained. Specimen placed in saline. A second ultrasound-guided core biopsy was obtained with a 16 gauge needle. Specimen placed in saline. Bandage placed over the puncture site. Ultrasound images were taken and saved for this procedure. FINDINGS: Core biopsies obtained from left kidney lower pole. No significant bleeding or hematoma formation following the core biopsies. IMPRESSION: Successful ultrasound-guided core biopsies from the left kidney lower pole. Electronically Signed   By: Richarda Overlie M.D.   On: 09/26/2016 18:11   Ct Biopsy  Result Date: 10/18/2016 INDICATION: History of renal amyloidosis with concern for myeloma. Please perform CT-guided bone marrow biopsy for tissue diagnostic purposes. EXAM: CT-GUIDED BONE MARROW BIOPSY AND ASPIRATION MEDICATIONS: None  ANESTHESIA/SEDATION: Fentanyl 100 mcg IV; Versed 2 mg IV Sedation Time: 10 minutes; The patient was continuously monitored during the procedure by the interventional radiology nurse under my direct supervision. COMPLICATIONS: None immediate. PROCEDURE: Informed consent was obtained from the patient following an explanation of the procedure, risks, benefits and alternatives. The patient understands, agrees and consents for the procedure. All questions were addressed. A time out was performed prior to the initiation of the procedure. The patient was positioned prone and non-contrast localization CT was performed of the pelvis to demonstrate the iliac marrow spaces. The operative site was prepped and draped in the usual sterile fashion. Under sterile conditions and local anesthesia, a 22 gauge spinal needle was utilized for procedural planning. Next, an 11 gauge coaxial bone biopsy needle was advanced into the left iliac marrow space. Needle position was confirmed with CT imaging. Initially, bone marrow aspiration was performed. Next, a bone marrow biopsy was obtained with the 11 gauge outer bone marrow device. Samples were prepared with the cytotechnologist and deemed adequate. The needle was removed intact. Hemostasis was obtained with compression and a dressing was placed. The patient tolerated the  procedure well without immediate post procedural complication. IMPRESSION: Successful CT guided left iliac bone marrow aspiration and core biopsy. Electronically Signed   By: Sandi Mariscal M.D.   On: 10/18/2016 09:55   Dg Bone Survey Met  Result Date: 10/04/2016 CLINICAL DATA:  Nephrotic syndrome.  Monoclonal paraproteinemia EXAM: METASTATIC BONE SURVEY COMPARISON:  None. FINDINGS: Multiple images of the axial and appendicular skeleton are provided. No aggressive lytic or sclerotic osseous lesion. 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 Mild dextrocurvature of the thoracolumbar spine.  Degenerative disc disease with severe disc height loss at L1-2, L4-5 and L5-S1 with bilateral facet arthropathy at L4-5 and L5-S1. Mild osteoarthritis of the right glenohumeral joint. Moderate osteoarthritis of the left glenohumeral joint. Moderate arthropathy of the left acromioclavicular joint. Lungs are clear. No focal consolidation, pleural effusion or pneumothorax. Stable cardiomediastinal silhouette. No acute fracture or dislocation. 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 Prior resection of the trapezoid bilaterally. IMPRESSION: No aggressive lytic or sclerotic osseous lesion of the axial and appendicular skeleton. Electronically Signed   By: Kathreen Devoid   On: 10/04/2016 16:27   Ct Bone Marrow Biopsy & Aspiration  Result Date: 10/18/2016 INDICATION: History of renal amyloidosis with concern for myeloma. Please perform CT-guided bone marrow biopsy for tissue diagnostic purposes. EXAM: CT-GUIDED BONE MARROW BIOPSY AND ASPIRATION MEDICATIONS: None ANESTHESIA/SEDATION: Fentanyl 100 mcg IV; Versed 2 mg IV Sedation Time: 10 minutes; The patient was continuously monitored during the procedure by the interventional radiology nurse under my direct supervision. COMPLICATIONS: None immediate. PROCEDURE: Informed consent was obtained from the patient following an explanation of the procedure, risks, benefits and alternatives. The patient understands, agrees and consents for the procedure. All questions were addressed. A time out was performed prior to the initiation of the procedure. The patient was positioned prone and non-contrast localization CT was performed of the pelvis to demonstrate the iliac marrow spaces. The operative site was prepped and draped in the usual sterile fashion. Under sterile conditions and local anesthesia, a 22 gauge spinal needle was utilized for procedural planning. Next, an 11 gauge coaxial bone biopsy needle was  advanced into the left iliac marrow space. Needle position was confirmed with CT imaging. Initially, bone marrow aspiration was performed. Next, a bone marrow biopsy was obtained with the 11 gauge outer bone marrow device. Samples were prepared with the cytotechnologist and deemed adequate. The needle was removed intact. Hemostasis was obtained with compression and a dressing was placed. The patient tolerated the procedure well without immediate post procedural complication. IMPRESSION: Successful CT guided left iliac bone marrow aspiration and core biopsy. Electronically Signed   By: Sandi Mariscal M.D.   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 Bone marrow biopsy shows no overt evidence of multiple myeloma as noted above. -No hypercalcemia, normal creatinine level, no anemia. -No evidence of bone lesions on whole body skeletal survey.  Echo shows normal ejection fraction with grade 1 diastolic dysfunction.  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  Hepatitis C, HIV and hepatitis B negative. Other workup as per nephrologist negative. Patient follows with Dr. Pearson Grippe  [facility].  PLAN -patient had multiple additional questions about AL Amyloidosis after reading the material we had given him last visit and after the chemo-counseling with were answered in details. -I discussed in detail all the lab findings and his bone marrow bx results. -will be starting CyBorD regimen (weekly Velcade/Cyclophophamide/dex) -will consider changing to twice weekly Velcade based on response/tolerability.   No orders of the defined types were placed in this encounter.  Continue weekly treatment with  labs -please schedule next 4 treatments RTC with Dr Irene Limbo in 1 week with labs for toxicity check  All of the patients questions were answered with apparent satisfaction. The patient knows to call the clinic with any problems, questions or concerns.  I spent 20 minutes counseling the patient face to face. The total time spent in the appointment was 30 minutes and more than 50% was on counseling and direct patient cares.  This document serves as a record of services personally performed by Sullivan Lone, MD. It was created on her behalf by Brandt Loosen, a trained medical scribe. The creation of this record is based on the scribe's personal observations and the provider's statements to them. This document has been checked and approved by the attending provider.  Sullivan Lone MD [place] AAHIVMS [facility] Hematology/Oncology Physician [facility]  (Office):       [phone] (Work cell):  [phone] (Fax):           [phone]

## 2016-10-24 NOTE — Telephone Encounter (Signed)
Spoke with patient regarding the changes in his schedule for next week.

## 2016-10-24 NOTE — Telephone Encounter (Signed)
Pt called and has appt scheduled for tomorrow 9/11. Dr. Irene Limbo to discuss biopsy results at visit then. Pt verbalized understanding.

## 2016-10-25 ENCOUNTER — Encounter: Payer: Self-pay | Admitting: Hematology

## 2016-10-25 ENCOUNTER — Other Ambulatory Visit: Payer: Self-pay

## 2016-10-25 ENCOUNTER — Ambulatory Visit (HOSPITAL_BASED_OUTPATIENT_CLINIC_OR_DEPARTMENT_OTHER): Payer: Medicare Other | Admitting: Hematology

## 2016-10-25 ENCOUNTER — Ambulatory Visit (HOSPITAL_BASED_OUTPATIENT_CLINIC_OR_DEPARTMENT_OTHER): Payer: Medicare Other

## 2016-10-25 ENCOUNTER — Other Ambulatory Visit (HOSPITAL_BASED_OUTPATIENT_CLINIC_OR_DEPARTMENT_OTHER): Payer: Medicare Other

## 2016-10-25 VITALS — BP 119/74 | HR 93 | Temp 98.0°F | Resp 18

## 2016-10-25 VITALS — BP 116/66 | HR 99 | Temp 98.5°F | Resp 20 | Ht 68.0 in | Wt 173.8 lb

## 2016-10-25 DIAGNOSIS — E8581 Light chain (AL) amyloidosis: Secondary | ICD-10-CM

## 2016-10-25 DIAGNOSIS — Z5112 Encounter for antineoplastic immunotherapy: Secondary | ICD-10-CM | POA: Diagnosis not present

## 2016-10-25 DIAGNOSIS — N059 Unspecified nephritic syndrome with unspecified morphologic changes: Secondary | ICD-10-CM

## 2016-10-25 DIAGNOSIS — Z5111 Encounter for antineoplastic chemotherapy: Secondary | ICD-10-CM | POA: Diagnosis not present

## 2016-10-25 LAB — COMPREHENSIVE METABOLIC PANEL
ALBUMIN: 2.4 g/dL — AB (ref 3.5–5.0)
ALK PHOS: 118 U/L (ref 40–150)
ALT: 30 U/L (ref 0–55)
ANION GAP: 9 meq/L (ref 3–11)
AST: 41 U/L — ABNORMAL HIGH (ref 5–34)
BILIRUBIN TOTAL: 0.38 mg/dL (ref 0.20–1.20)
BUN: 19.5 mg/dL (ref 7.0–26.0)
CO2: 21 mEq/L — ABNORMAL LOW (ref 22–29)
Calcium: 8.9 mg/dL (ref 8.4–10.4)
Chloride: 104 mEq/L (ref 98–109)
Creatinine: 0.8 mg/dL (ref 0.7–1.3)
EGFR: 87 mL/min/{1.73_m2} — AB (ref >90)
GLUCOSE: 142 mg/dL — AB (ref 70–140)
POTASSIUM: 4.3 meq/L (ref 3.5–5.1)
SODIUM: 134 meq/L — AB (ref 136–145)
TOTAL PROTEIN: 5.2 g/dL — AB (ref 6.4–8.3)

## 2016-10-25 LAB — CBC WITH DIFFERENTIAL/PLATELET
BASO%: 0.8 % (ref 0.0–2.0)
BASOS ABS: 0 10*3/uL (ref 0.0–0.1)
EOS ABS: 0.1 10*3/uL (ref 0.0–0.5)
EOS%: 1.2 % (ref 0.0–7.0)
HCT: 42.2 % (ref 38.4–49.9)
HGB: 14.4 g/dL (ref 13.0–17.1)
LYMPH%: 25.4 % (ref 14.0–49.0)
MCH: 33.5 pg — AB (ref 27.2–33.4)
MCHC: 34.1 g/dL (ref 32.0–36.0)
MCV: 98.3 fL — AB (ref 79.3–98.0)
MONO#: 0.6 10*3/uL (ref 0.1–0.9)
MONO%: 11.8 % (ref 0.0–14.0)
NEUT#: 3.1 10*3/uL (ref 1.5–6.5)
NEUT%: 60.8 % (ref 39.0–75.0)
PLATELETS: 248 10*3/uL (ref 140–400)
RBC: 4.3 10*6/uL (ref 4.20–5.82)
RDW: 13.2 % (ref 11.0–14.6)
WBC: 5.1 10*3/uL (ref 4.0–10.3)
lymph#: 1.3 10*3/uL (ref 0.9–3.3)

## 2016-10-25 MED ORDER — SODIUM CHLORIDE 0.9 % IV SOLN
40.0000 mg | Freq: Once | INTRAVENOUS | Status: AC
  Administered 2016-10-25: 40 mg via INTRAVENOUS
  Filled 2016-10-25: qty 4

## 2016-10-25 MED ORDER — SODIUM CHLORIDE 0.9 % IV SOLN
300.0000 mg/m2 | Freq: Once | INTRAVENOUS | Status: AC
  Administered 2016-10-25: 580 mg via INTRAVENOUS
  Filled 2016-10-25: qty 29

## 2016-10-25 MED ORDER — SODIUM CHLORIDE 0.9 % IV SOLN
Freq: Once | INTRAVENOUS | Status: AC
  Administered 2016-10-25: 15:00:00 via INTRAVENOUS

## 2016-10-25 MED ORDER — BORTEZOMIB CHEMO SQ INJECTION 3.5 MG (2.5MG/ML)
1.5000 mg/m2 | Freq: Once | INTRAMUSCULAR | Status: AC
  Administered 2016-10-25: 3 mg via SUBCUTANEOUS
  Filled 2016-10-25: qty 3

## 2016-10-25 MED ORDER — PALONOSETRON HCL INJECTION 0.25 MG/5ML
INTRAVENOUS | Status: AC
  Filled 2016-10-25: qty 5

## 2016-10-25 MED ORDER — PALONOSETRON HCL INJECTION 0.25 MG/5ML
0.2500 mg | Freq: Once | INTRAVENOUS | Status: AC
  Administered 2016-10-25: 0.25 mg via INTRAVENOUS

## 2016-10-25 NOTE — Patient Instructions (Signed)
[facility] Discharge Instructions for Patients Receiving Chemotherapy  Today you received the following chemotherapy agents: Velcade and Cytoxan   To help prevent nausea and vomiting after your treatment, we encourage you to take your nausea medication as directed.    If you develop nausea and vomiting that is not controlled by your nausea medication, call the clinic.   BELOW ARE SYMPTOMS THAT SHOULD BE REPORTED IMMEDIATELY:  *FEVER GREATER THAN 100.5 F  *CHILLS WITH OR WITHOUT FEVER  NAUSEA AND VOMITING THAT IS NOT CONTROLLED WITH YOUR NAUSEA MEDICATION  *UNUSUAL SHORTNESS OF BREATH  *UNUSUAL BRUISING OR BLEEDING  TENDERNESS IN MOUTH AND THROAT WITH OR WITHOUT PRESENCE OF ULCERS  *URINARY PROBLEMS  *BOWEL PROBLEMS  UNUSUAL RASH Items with * indicate a potential emergency and should be followed up as soon as possible.  Feel free to call the clinic you have any questions or concerns. The clinic phone number is [phone].  Please show the [facility] at check-in to the Emergency Department and triage nurse.     Bortezomib injection What is this medicine? BORTEZOMIB (bor TEZ oh mib) is a medicine that targets proteins in cancer cells and stops the cancer cells from growing. It is used to treat multiple myeloma and mantle-cell lymphoma. This medicine may be used for other purposes; ask your health care provider or pharmacist if you have questions. COMMON BRAND NAME(S): Velcade What should I tell my health care provider before I take this medicine? They need to know if you have any of these conditions: -diabetes -heart disease -irregular heartbeat -liver disease -on hemodialysis -low blood counts, like low white blood cells, platelets, or hemoglobin -peripheral neuropathy -taking medicine for blood pressure -an unusual or allergic reaction to bortezomib, mannitol, boron, other medicines, foods, dyes, or preservatives -pregnant or trying to get  pregnant -breast-feeding How should I use this medicine? This medicine is for injection into a vein or for injection under the skin. It is given by a health care professional in a hospital or clinic setting. Talk to your pediatrician regarding the use of this medicine in children. Special care may be needed. Overdosage: If you think you have taken too much of this medicine contact a poison control center or emergency room at once. NOTE: This medicine is only for you. Do not share this medicine with others. What if I miss a dose? It is important not to miss your dose. Call your doctor or health care professional if you are unable to keep an appointment. What may interact with this medicine? This medicine may interact with the following medications: -ketoconazole -rifampin -ritonavir -St. John's Wort This list may not describe all possible interactions. Give your health care provider a list of all the medicines, herbs, non-prescription drugs, or dietary supplements you use. Also tell them if you smoke, drink alcohol, or use illegal drugs. Some items may interact with your medicine. What should I watch for while using this medicine? You may get drowsy or dizzy. Do not drive, use machinery, or do anything that needs mental alertness until you know how this medicine affects you. Do not stand or sit up quickly, especially if you are an older patient. This reduces the risk of dizzy or fainting spells. In some cases, you may be given additional medicines to help with side effects. Follow all directions for their use. Call your doctor or health care professional for advice if you get a fever, chills or sore throat, or other symptoms of a  cold or flu. Do not treat yourself. This drug decreases your body's ability to fight infections. Try to avoid being around people who are sick. This medicine may increase your risk to bruise or bleed. Call your doctor or health care professional if you notice any unusual  bleeding. You may need blood work done while you are taking this medicine. In some patients, this medicine may cause a serious brain infection that may cause death. If you have any problems seeing, thinking, speaking, walking, or standing, tell your doctor right away. If you cannot reach your doctor, urgently seek other source of medical care. Check with your doctor or health care professional if you get an attack of severe diarrhea, nausea and vomiting, or if you sweat a lot. The loss of too much body fluid can make it dangerous for you to take this medicine. Do not become pregnant while taking this medicine or for at least 2 months after stopping it. Women should inform their doctor if they wish to become pregnant or think they might be pregnant. Men should not father a child while taking this medicine and for at least 2 months after stopping it. There is a potential for serious side effects to an unborn child. Talk to your health care professional or pharmacist for more information. Do not breast-feed an infant while taking this medicine or for 2 months after stopping it. This medicine may interfere with the ability to have a child. You should talk with your doctor or health care professional if you are concerned about your fertility. What side effects may I notice from receiving this medicine? Side effects that you should report to your doctor or health care professional as soon as possible: -allergic reactions like skin rash, itching or hives, swelling of the face, lips, or tongue -breathing problems -changes in hearing -changes in vision -fast, irregular heartbeat -feeling faint or lightheaded, falls -pain, tingling, numbness in the hands or feet -right upper belly pain -seizures -swelling of the ankles, feet, hands -unusual bleeding or bruising -unusually weak or tired -vomiting -yellowing of the eyes or skin Side effects that usually do not require medical attention (report to your  doctor or health care professional if they continue or are bothersome): -changes in emotions or moods -constipation -diarrhea -loss of appetite -headache -irritation at site where injected -nausea This list may not describe all possible side effects. Call your doctor for medical advice about side effects. You may report side effects to FDA at 1-800-FDA-1088. Where should I keep my medicine? This drug is given in a hospital or clinic and will not be stored at home. NOTE: This sheet is a summary. It may not cover all possible information. If you have questions about this medicine, talk to your doctor, pharmacist, or health care provider.  2018 Elsevier/Gold Standard (2015-12-31 15:53:51)   Cyclophosphamide injection What is this medicine? CYCLOPHOSPHAMIDE (sye kloe FOSS fa mide) is a chemotherapy drug. It slows the growth of cancer cells. This medicine is used to treat many types of cancer like lymphoma, myeloma, leukemia, breast cancer, and ovarian cancer, to name a few. This medicine may be used for other purposes; ask your health care provider or pharmacist if you have questions. COMMON BRAND NAME(S): Cytoxan, Neosar What should I tell my health care provider before I take this medicine? They need to know if you have any of these conditions: -blood disorders -history of other chemotherapy -infection -kidney disease -liver disease -recent or ongoing radiation therapy -tumors in the bone  marrow -an unusual or allergic reaction to cyclophosphamide, other chemotherapy, other medicines, foods, dyes, or preservatives -pregnant or trying to get pregnant -breast-feeding How should I use this medicine? This drug is usually given as an injection into a vein or muscle or by infusion into a vein. It is administered in a hospital or clinic by a specially trained health care professional. Talk to your pediatrician regarding the use of this medicine in children. Special care may be  needed. Overdosage: If you think you have taken too much of this medicine contact a poison control center or emergency room at once. NOTE: This medicine is only for you. Do not share this medicine with others. What if I miss a dose? It is important not to miss your dose. Call your doctor or health care professional if you are unable to keep an appointment. What may interact with this medicine? This medicine may interact with the following medications: -amiodarone -amphotericin B -azathioprine -certain antiviral medicines for HIV or AIDS such as protease inhibitors (e.g., indinavir, ritonavir) and zidovudine -certain blood pressure medications such as benazepril, captopril, enalapril, fosinopril, lisinopril, moexipril, monopril, perindopril, quinapril, ramipril, trandolapril -certain cancer medications such as anthracyclines (e.g., daunorubicin, doxorubicin), busulfan, cytarabine, paclitaxel, pentostatin, tamoxifen, trastuzumab -certain diuretics such as chlorothiazide, chlorthalidone, hydrochlorothiazide, indapamide, metolazone -certain medicines that treat or prevent blood clots like warfarin -certain muscle relaxants such as succinylcholine -cyclosporine -etanercept -indomethacin -medicines to increase blood counts like filgrastim, pegfilgrastim, sargramostim -medicines used as general anesthesia -metronidazole -natalizumab This list may not describe all possible interactions. Give your health care provider a list of all the medicines, herbs, non-prescription drugs, or dietary supplements you use. Also tell them if you smoke, drink alcohol, or use illegal drugs. Some items may interact with your medicine. What should I watch for while using this medicine? Visit your doctor for checks on your progress. This drug may make you feel generally unwell. This is not uncommon, as chemotherapy can affect healthy cells as well as cancer cells. Report any side effects. Continue your course of  treatment even though you feel ill unless your doctor tells you to stop. Drink water or other fluids as directed. Urinate often, even at night. In some cases, you may be given additional medicines to help with side effects. Follow all directions for their use. Call your doctor or health care professional for advice if you get a fever, chills or sore throat, or other symptoms of a cold or flu. Do not treat yourself. This drug decreases your body's ability to fight infections. Try to avoid being around people who are sick. This medicine may increase your risk to bruise or bleed. Call your doctor or health care professional if you notice any unusual bleeding. Be careful brushing and flossing your teeth or using a toothpick because you may get an infection or bleed more easily. If you have any dental work done, tell your dentist you are receiving this medicine. You may get drowsy or dizzy. Do not drive, use machinery, or do anything that needs mental alertness until you know how this medicine affects you. Do not become pregnant while taking this medicine or for 1 year after stopping it. Women should inform their doctor if they wish to become pregnant or think they might be pregnant. Men should not father a child while taking this medicine and for 4 months after stopping it. There is a potential for serious side effects to an unborn child. Talk to your health care professional or pharmacist for more  information. Do not breast-feed an infant while taking this medicine. This medicine may interfere with the ability to have a child. This medicine has caused ovarian failure in some women. This medicine has caused reduced sperm counts in some men. You should talk with your doctor or health care professional if you are concerned about your fertility. If you are going to have surgery, tell your doctor or health care professional that you have taken this medicine. What side effects may I notice from receiving this  medicine? Side effects that you should report to your doctor or health care professional as soon as possible: -allergic reactions like skin rash, itching or hives, swelling of the face, lips, or tongue -low blood counts - this medicine may decrease the number of white blood cells, red blood cells and platelets. You may be at increased risk for infections and bleeding. -signs of infection - fever or chills, cough, sore throat, pain or difficulty passing urine -signs of decreased platelets or bleeding - bruising, pinpoint red spots on the skin, black, tarry stools, blood in the urine -signs of decreased red blood cells - unusually weak or tired, fainting spells, lightheadedness -breathing problems -dark urine -dizziness -palpitations -swelling of the ankles, feet, hands -trouble passing urine or change in the amount of urine -weight gain -yellowing of the eyes or skin Side effects that usually do not require medical attention (report to your doctor or health care professional if they continue or are bothersome): -changes in nail or skin color -hair loss -missed menstrual periods -mouth sores -nausea, vomiting This list may not describe all possible side effects. Call your doctor for medical advice about side effects. You may report side effects to FDA at 1-800-FDA-1088. Where should I keep my medicine? This drug is given in a hospital or clinic and will not be stored at home. NOTE: This sheet is a summary. It may not cover all possible information. If you have questions about this medicine, talk to your doctor, pharmacist, or health care provider.  2018 Elsevier/Gold Standard (2011-12-16 16:22:58)   Palonosetron Injection What is this medicine? PALONOSETRON (pal oh NOE se tron) is used to prevent nausea and vomiting caused by chemotherapy. It also helps prevent delayed nausea and vomiting that may occur a few days after your treatment. This medicine may be used for other purposes; ask  your health care provider or pharmacist if you have questions. COMMON BRAND NAME(S): Aloxi What should I tell my health care provider before I take this medicine? They need to know if you have any of these conditions: -an unusual or allergic reaction to palonosetron, dolasetron, granisetron, ondansetron, other medicines, foods, dyes, or preservatives -pregnant or trying to get pregnant -breast-feeding How should I use this medicine? This medicine is for infusion into a vein. It is given by a health care professional in a hospital or clinic setting. Talk to your pediatrician regarding the use of this medicine in children. While this drug may be prescribed for children as young as 1 month for selected conditions, precautions do apply. Overdosage: If you think you have taken too much of this medicine contact a poison control center or emergency room at once. NOTE: This medicine is only for you. Do not share this medicine with others. What if I miss a dose? This does not apply. What may interact with this medicine? -certain medicines for depression, anxiety, or psychotic disturbances -fentanyl -linezolid -MAOIs like Carbex, Eldepryl, Marplan, Nardil, and Parnate -methylene blue (injected into a vein) -tramadol This  list may not describe all possible interactions. Give your health care provider a list of all the medicines, herbs, non-prescription drugs, or dietary supplements you use. Also tell them if you smoke, drink alcohol, or use illegal drugs. Some items may interact with your medicine. What should I watch for while using this medicine? Your condition will be monitored carefully while you are receiving this medicine. What side effects may I notice from receiving this medicine? Side effects that you should report to your doctor or health care professional as soon as possible: -allergic reactions like skin rash, itching or hives, swelling of the face, lips, or tongue -breathing  problems -confusion -dizziness -fast, irregular heartbeat -fever and chills -loss of balance or coordination -seizures -sweating -swelling of the hands and feet -tremors -unusually weak or tired Side effects that usually do not require medical attention (report to your doctor or health care professional if they continue or are bothersome): -constipation or diarrhea -headache This list may not describe all possible side effects. Call your doctor for medical advice about side effects. You may report side effects to FDA at 1-800-FDA-1088. Where should I keep my medicine? This drug is given in a hospital or clinic and will not be stored at home. NOTE: This sheet is a summary. It may not cover all possible information. If you have questions about this medicine, talk to your doctor, pharmacist, or health care provider.  2018 Elsevier/Gold Standard (2012-12-07 10:38:36)

## 2016-10-25 NOTE — Progress Notes (Signed)
Discharge instructions printed and Verbally reviewed with pt. Pt verbalizes understanding.  Pt tolerated velcade injection well. Pt monitored 30 minutes post injection. 1638: Pt tolerated treatment well. Pt stable at discharge.

## 2016-10-26 ENCOUNTER — Telehealth: Payer: Self-pay | Admitting: Hematology

## 2016-10-26 ENCOUNTER — Telehealth: Payer: Self-pay

## 2016-10-26 LAB — KAPPA/LAMBDA LIGHT CHAINS
IG KAPPA FREE LIGHT CHAIN: 9.5 mg/L (ref 3.3–19.4)
IG LAMBDA FREE LIGHT CHAIN: 120.9 mg/L — AB (ref 5.7–26.3)
KAPPA/LAMBDA FLC RATIO: 0.08 — AB (ref 0.26–1.65)

## 2016-10-26 NOTE — Telephone Encounter (Signed)
Called patient regarding September - October appointments °

## 2016-10-26 NOTE — Telephone Encounter (Signed)
Chemo f/u phone call completed. Pt not complaining of any symptoms at this time. Feeling well and energy level is good. Pt to call with any further questions or symptoms based on guidelines printed upon d/c from infusion yesterday. Pt verbalized understanding.

## 2016-10-27 LAB — MULTIPLE MYELOMA PANEL, SERUM
ALBUMIN/GLOB SERPL: 1.2 (ref 0.7–1.7)
ALPHA 1: 0.2 g/dL (ref 0.0–0.4)
ALPHA2 GLOB SERPL ELPH-MCNC: 0.9 g/dL (ref 0.4–1.0)
Albumin SerPl Elph-Mcnc: 2.6 g/dL — ABNORMAL LOW (ref 2.9–4.4)
B-Globulin SerPl Elph-Mcnc: 0.7 g/dL (ref 0.7–1.3)
GAMMA GLOB SERPL ELPH-MCNC: 0.4 g/dL (ref 0.4–1.8)
GLOBULIN, TOTAL: 2.3 g/dL (ref 2.2–3.9)
IGG (IMMUNOGLOBIN G), SERUM: 496 mg/dL — AB (ref 700–1600)
IgA, Qn, Serum: 76 mg/dL (ref 61–437)
IgM, Qn, Serum: 22 mg/dL (ref 15–143)
M PROTEIN SERPL ELPH-MCNC: 0.2 g/dL — AB
Total Protein: 4.9 g/dL — ABNORMAL LOW (ref 6.0–8.5)

## 2016-10-31 NOTE — Progress Notes (Signed)
[name]    HEMATOLOGY/ONCOLOGY FOLLOW UP NOTE  Date of Service: 11/01/2016  Patient Care Team: Unk Pinto, MD as PCP - General (Internal Medicine) Inda Castle, MD as Consulting Physician (Gastroenterology) Rexene Agent, MD as Attending Physician (Nephrology) Pearson Grippe MD -nephrology   CHIEF COMPLAINTS/PURPOSE OF CONSULTATION:  Renal AL Amyloidosis  HISTORY OF PRESENTING ILLNESS:  Gregory Galvan is a wonderful 76 y.o. male who has been referred to [place] by Dr .Unk Pinto, MD /Dr Pearson Grippe MD for evaluation and management of newly diagnosed Renal AL Amyloidosis.  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   He recently developed lower extremity swelling and increasing proteinuria and was referred to nephrology and was seen by Dr. Pearson Grippe at [facility].   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  SPEP showed a small M spike of 0.2 g/dL IFE showing IgG lambda monoclonal paraproteinemia. Increase in serum lambda light chain with a decreased kappa/Lambda light chain ratio.  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 [place] red stain positive.  Patient was referred to [place] for further evaluation and management of his newly diagnosed AL Amyloidosis .  Patient notes his leg swelling is better with diuretics.  Notes no fever no chills no night sweats no unexpected weight loss. No focal bone pains. Labs have not shown any overt anemia or hypercalcemia. His creatinine is relatively normal at 1. Hypoalbuminemia with albumin level of 2.7 g/dL.  INTERVAL HISTORY  Patient presents to the clinic today for follow up of his renal AL amyloidosis. Since beginning treatment, he  denies any nausea. He had some slight constipation, which has resolved now. He has no other concerns at this time and is tolerating treatment well. However, he felt slightly "spacier" following his last treatment. He had some dizziness yesterday. He reports his swelling of the ankles are about the same as last visit.   MEDICAL HISTORY:  Past Medical History:  Diagnosis Date  . BPH (benign prostatic hyperplasia)   . Hyperlipidemia   . Hypertension   . Hypogonadism, male   . Pre-diabetes   . Prostatitis   . 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 No surgical procedure performed. History of hypertension for 30 years on ACE inhibitor, beta blocker and Lasix Hypothyroidism   SURGICAL HISTORY: Past Surgical History:  Procedure Laterality Date  . APPENDECTOMY      SOCIAL HISTORY: Social History   Social History  . Marital status: Divorced    Spouse name: N/A  . Number of children: N/A  . Years of education: N/A   Occupational History  . Not on file.   Social History Main Topics  . Smoking status: Former Smoker    Quit date: 12/22/2013  . Smokeless tobacco: Never Used  . Alcohol use 16.8 oz/week    28 Standard drinks or equivalent per week     Comment: drinks wine  . Drug use: No  . Sexual activity: Not on file   Other Topics Concern  . Not on file   Social History Narrative  . No narrative on file    FAMILY HISTORY: Family History  Problem Relation Age of Onset  . Cancer Mother  breast  . Alzheimer's disease Mother   . Hypertension Father   . Heart disease Father   . Lymphoma Father   . Hypertension Brother   . Heart disease Brother   . Heart disease Brother   . Hypertension Brother   . Diabetes Brother     ALLERGIES:  is allergic to ppd [tuberculin purified protein derivative]; sulfonamide derivatives; and penicillins.  MEDICATIONS:  Current Outpatient Prescriptions  Medication Sig  Dispense Refill  . acyclovir (ZOVIRAX) 400 MG tablet Take 1 tablet (400 mg total) by mouth 2 (two) times daily. 60 tablet 3  . ALPRAZolam (XANAX) 0.5 MG tablet Take 1/2 to 1 tablet at bedtime PRN. 30 tablet 0  . Cholecalciferol (VITAMIN D) 2000 units tablet Take 8,000 Units by mouth daily.    [name] dexamethasone (DECADRON) 4 MG tablet Take 2 tablets (8 mg total) by mouth daily. Start the day after chemotherapy for 2 days. 30 tablet 1  . furosemide (LASIX) 40 MG tablet 1-2 pills a day for swelling (Patient taking differently: Take 80 mg by mouth daily. ) 60 tablet 11  . latanoprost (XALATAN) 0.005 % ophthalmic solution Place 1 drop into both eyes at bedtime.     [name] levothyroxine (SYNTHROID, LEVOTHROID) 175 MCG tablet take 1 and 1/2 tablets by mouth once daily or as directed 135 tablet 1  . lisinopril (PRINIVIL,ZESTRIL) 20 MG tablet Take 20 mg by mouth daily.  0  . Magnesium 100 MG TABS Take 300 mg by mouth daily.    . milk thistle 175 MG tablet Take 175 mg by mouth daily.    . Multiple Vitamins-Minerals (MULTIVITAMIN WITH MINERALS) tablet Take 1 tablet by mouth daily.    . ondansetron (ZOFRAN) 8 MG tablet Take 1 tablet (8 mg total) by mouth 2 (two) times daily as needed for refractory nausea / vomiting. Start on day 3 after chemo. 30 tablet 1  . PARoxetine (PAXIL) 20 MG tablet Take 10 mg by mouth daily.    . prochlorperazine (COMPAZINE) 10 MG tablet Take 1 tablet (10 mg total) by mouth every 6 (six) hours as needed (Nausea or vomiting). 30 tablet 1  . Turmeric 500 MG CAPS Take 1,500 mg elemental calcium/kg/hr by mouth daily.     No current facility-administered medications for this visit.     REVIEW OF SYSTEMS:    10 Point review of Systems was done is negative except as noted above.  PHYSICAL EXAMINATION: ECOG PERFORMANCE STATUS: 1 - Symptomatic but completely ambulatory  . Vitals:   11/01/16 1358  BP: (!) 99/55  Pulse: 81  Resp: 18  Temp: 98.2 F (36.8 C)  SpO2: 100%   Filed Weights     11/01/16 1358  Weight: 172 lb 8 oz (78.2 kg)   .Body mass index is 26.23 kg/m.  GENERAL:alert, in no acute distress and comfortable SKIN: no acute rashes, no significant lesions EYES: conjunctiva are pink and non-injected, sclera anicteric OROPHARYNX: MMM, no exudates, no oropharyngeal erythema or ulceration NECK: supple, no JVD LYMPH:  no palpable lymphadenopathy in the cervical, axillary or inguinal regions LUNGS: clear to auscultation b/l with normal respiratory effort HEART: regular rate & rhythm ABDOMEN:  normoactive bowel sounds , non tender, not distended. No palpable hepatosplenomegaly Extremity: no pedal edema (+)swelling of ankles PSYCH: alert & oriented x 3 with fluent speech NEURO: no focal motor/sensory deficits  LABORATORY DATA:  I have reviewed the data as listed  . CBC Latest Ref Rng & Units 11/01/2016 10/25/2016 10/18/2016  WBC 4.0 -  10.3 10e3/uL 6.0 5.1 5.4  Hemoglobin 13.0 - 17.1 g/dL 13.8 14.4 13.6  Hematocrit 38.4 - 49.9 % 39.7 42.2 38.0(L)  Platelets 140 - 400 10e3/uL 262 248 257    . CMP Latest Ref Rng & Units 11/01/2016 10/25/2016 10/25/2016  Glucose 70 - 140 mg/dl 124 142(H) -  BUN 7.0 - 26.0 mg/dL 23.8 19.5 -  Creatinine 0.7 - 1.3 mg/dL 0.9 0.8 -  Sodium 136 - 145 mEq/L 132(L) 134(L) -  Potassium 3.5 - 5.1 mEq/L 4.5 4.3 -  Chloride 101 - 111 mmol/L - - -  CO2 22 - 29 mEq/L 24 21(L) -  Calcium 8.4 - 10.4 mg/dL 8.6 8.9 -  Total Protein 6.4 - 8.3 g/dL 5.1(L) 5.2(L) 4.9(L)  Total Bilirubin 0.20 - 1.20 mg/dL 0.33 0.38 -  Alkaline Phos 40 - 150 U/L 101 118 -  AST 5 - 34 U/L 31 41(H) -  ALT 0 - 55 U/L 30 30 -         Component     Latest Ref Rng & Units 08/22/2016 10/07/2016  C3 Complement     82 - 185 mg/dL 113   C4 Complement     15 - 53 mg/dL 32   Anit Nuclear Antibody(ANA)     NEGATIVE NEG   RPR     NON REAC NON REAC   Angiotensin-Converting Enzyme     9 - 67 U/L 17   ds DNA Ab     IU/mL 1   ANCA SCREEN      Negative   Hep C Virus  Ab     0.0 - 0.9 s/co ratio  0.1   Component     Latest Ref Rng & Units 10/07/2016  Hep C Virus Ab     0.0 - 0.9 s/co ratio 0.1  Hepatitis B Surface Ag     Negative Negative  HIV Screen 4th Generation wRfx     Non Reactive Non Reactive                                   *[place]*                  *[facility]*                          [address]                        [place], [place] [address]                            [phone]  ------------------------------------------------------------------- Transthoracic Echocardiography  Patient:    Kyair, Ditommaso MR #:       [id number] Study Date: 10/12/2016 Gender:     M Age:        16 Height:     172.7 cm Weight:     77.7 kg BSA:        1.94 m^2 Pt. Status: Room:   SONOGRAPHER  Dustin Flock, RCS  PERFORMING   [facility], Outpatient  ATTENDING    [place], [place], Gautam Kishore  REFERRING    [place], [place] Kishore  cc:  ------------------------------------------------------------------- LV EF: 65% -   70%  ------------------------------------------------------------------- Indications:  Pre-op evaluation V728.1.  ------------------------------------------------------------------- History:   PMH:  Renal Amyloidosis, Hypertension, pre-chemo.  ------------------------------------------------------------------- Study Conclusions  - Left ventricle: The cavity size was normal. Wall thickness was   increased in a pattern of moderate LVH. Systolic function was   vigorous. The estimated ejection fraction was in the range of 65%   to 70%. Wall motion was normal; there were no regional wall   motion abnormalities. Doppler parameters are consistent with   abnormal left ventricular relaxation (grade 1 diastolic   dysfunction). - Mitral valve: There was trivial regurgitation. - Left atrium: The atrium was mildly dilated.    RADIOGRAPHIC STUDIES:  I have  personally reviewed the radiological images as listed and agreed with the findings in the report. Ct Biopsy  Result Date: 10/18/2016 INDICATION: History of renal amyloidosis with concern for myeloma. Please perform CT-guided bone marrow biopsy for tissue diagnostic purposes. EXAM: CT-GUIDED BONE MARROW BIOPSY AND ASPIRATION MEDICATIONS: None ANESTHESIA/SEDATION: Fentanyl 100 mcg IV; Versed 2 mg IV Sedation Time: 10 minutes; The patient was continuously monitored during the procedure by the interventional radiology nurse under my direct supervision. COMPLICATIONS: None immediate. PROCEDURE: Informed consent was obtained from the patient following an explanation of the procedure, risks, benefits and alternatives. The patient understands, agrees and consents for the procedure. All questions were addressed. A time out was performed prior to the initiation of the procedure. The patient was positioned prone and non-contrast localization CT was performed of the pelvis to demonstrate the iliac marrow spaces. The operative site was prepped and draped in the usual sterile fashion. Under sterile conditions and local anesthesia, a 22 gauge spinal needle was utilized for procedural planning. Next, an 11 gauge coaxial bone biopsy needle was advanced into the left iliac marrow space. Needle position was confirmed with CT imaging. Initially, bone marrow aspiration was performed. Next, a bone marrow biopsy was obtained with the 11 gauge outer bone marrow device. Samples were prepared with the cytotechnologist and deemed adequate. The needle was removed intact. Hemostasis was obtained with compression and a dressing was placed. The patient tolerated the procedure well without immediate post procedural complication. IMPRESSION: Successful CT guided left iliac bone marrow aspiration and core biopsy. Electronically Signed   By: Sandi Mariscal M.D.   On: 10/18/2016 09:55   Dg Bone Survey Met  Result Date: 10/04/2016 CLINICAL DATA:   Nephrotic syndrome.  Monoclonal paraproteinemia EXAM: METASTATIC BONE SURVEY COMPARISON:  None. FINDINGS: Multiple images of the axial and appendicular skeleton are provided. No aggressive lytic or sclerotic osseous lesion. 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 Mild dextrocurvature of the thoracolumbar spine. Degenerative disc disease with severe disc height loss at L1-2, L4-5 and L5-S1 with bilateral facet arthropathy at L4-5 and L5-S1. Mild osteoarthritis of the right glenohumeral joint. Moderate osteoarthritis of the left glenohumeral joint. Moderate arthropathy of the left acromioclavicular joint. Lungs are clear. No focal consolidation, pleural effusion or pneumothorax. Stable cardiomediastinal silhouette. No acute fracture or dislocation. 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 Prior resection of the trapezoid bilaterally. IMPRESSION: No aggressive lytic or sclerotic osseous lesion of the axial and appendicular skeleton. Electronically Signed   By: Kathreen Devoid   On: 10/04/2016 16:27   Ct Bone Marrow Biopsy & Aspiration  Result Date: 10/18/2016 INDICATION: History of renal amyloidosis with concern for myeloma. Please perform CT-guided bone marrow biopsy for tissue diagnostic purposes. EXAM: CT-GUIDED BONE MARROW BIOPSY  AND ASPIRATION MEDICATIONS: None ANESTHESIA/SEDATION: Fentanyl 100 mcg IV; Versed 2 mg IV Sedation Time: 10 minutes; The patient was continuously monitored during the procedure by the interventional radiology nurse under my direct supervision. COMPLICATIONS: None immediate. PROCEDURE: Informed consent was obtained from the patient following an explanation of the procedure, risks, benefits and alternatives. The patient understands, agrees and consents for the procedure. All questions were addressed. A time out was performed prior to the initiation of the procedure. The patient was  positioned prone and non-contrast localization CT was performed of the pelvis to demonstrate the iliac marrow spaces. The operative site was prepped and draped in the usual sterile fashion. Under sterile conditions and local anesthesia, a 22 gauge spinal needle was utilized for procedural planning. Next, an 11 gauge coaxial bone biopsy needle was advanced into the left iliac marrow space. Needle position was confirmed with CT imaging. Initially, bone marrow aspiration was performed. Next, a bone marrow biopsy was obtained with the 11 gauge outer bone marrow device. Samples were prepared with the cytotechnologist and deemed adequate. The needle was removed intact. Hemostasis was obtained with compression and a dressing was placed. The patient tolerated the procedure well without immediate post procedural complication. IMPRESSION: Successful CT guided left iliac bone marrow aspiration and core biopsy. Electronically Signed   By: Sandi Mariscal M.D.   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  -No hypercalcemia, normal creatinine level, no anemia. -No evidence of bone lesions on whole body skeletal survey. BM Bx- no evidence of myeloma  Echo shows normal ejection fraction with grade 1 diastolic dysfunction.  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  Hepatitis C, HIV and hepatitis B negative. Other workup as per nephrologist negative. Patient follows with Dr. Pearson Grippe   [facility]. PLAN -we discussed the BM Bx results in details. -patient had additional questions about AL Amyloidosis after reading the educational material - I tried to answer these in details to his  apparent satisfaction. -labs stable today - no prohibtiive toxicities from 1st round of treatment -continue  Acyclovir -Myeloma panel, SFLC and UPEP q4 weeks. -depending on tolerability and response might need to Velcade twice weekly.  Orders Placed This Encounter  Procedures  . CBC & Diff and Retic    Standing Status:   Standing    Number of Occurrences:   4    Standing Expiration Date:   11/01/2017  . Comprehensive metabolic panel    Standing Status:   Standing    Number of Occurrences:   4    Standing Expiration Date:   11/01/2017  . Multiple Myeloma Panel (SPEP&IFE w/QIG)    Standing Status:   Future    Standing Expiration Date:   11/01/2017  . Kappa/lambda light chains    Standing Status:   Future    Standing Expiration Date:   12/06/2017   Patient is appropriate for Chemotherapy today RTC with Dr Irene Limbo in 3 weeks Prescription for compression socks  All of the patients questions were answered with apparent satisfaction. The patient knows to call the clinic with any problems, questions or concerns.  I spent 20 minutes counseling the patient face to face. The total time spent in the  appointment was 83 minutes and more than 50% was on counseling and direct patient cares.  This document serves as a record of services personally performed by Sullivan Lone, MD. It was created on his behalf by Brandt Loosen, a trained medical scribe. The creation of this record is based on the scribe's personal observations and the provider's statements to them. This document has been checked and approved by the attending provider.  Sullivan Lone MD [place] [facility] Hematology/Oncology Physician [facility]  (Office):       [phone] (Work cell):  [phone] (Fax):           [phone]  11/01/2016 2:18 PM

## 2016-11-01 ENCOUNTER — Other Ambulatory Visit (HOSPITAL_BASED_OUTPATIENT_CLINIC_OR_DEPARTMENT_OTHER): Payer: Medicare Other

## 2016-11-01 ENCOUNTER — Ambulatory Visit (HOSPITAL_BASED_OUTPATIENT_CLINIC_OR_DEPARTMENT_OTHER): Payer: Medicare Other

## 2016-11-01 ENCOUNTER — Encounter: Payer: Self-pay | Admitting: Hematology

## 2016-11-01 ENCOUNTER — Ambulatory Visit (HOSPITAL_BASED_OUTPATIENT_CLINIC_OR_DEPARTMENT_OTHER): Payer: Medicare Other | Admitting: Hematology

## 2016-11-01 VITALS — BP 99/55 | HR 81 | Temp 98.2°F | Resp 18 | Wt 172.5 lb

## 2016-11-01 DIAGNOSIS — E8581 Light chain (AL) amyloidosis: Secondary | ICD-10-CM

## 2016-11-01 DIAGNOSIS — Z5112 Encounter for antineoplastic immunotherapy: Secondary | ICD-10-CM

## 2016-11-01 DIAGNOSIS — N059 Unspecified nephritic syndrome with unspecified morphologic changes: Secondary | ICD-10-CM | POA: Diagnosis not present

## 2016-11-01 DIAGNOSIS — Z5111 Encounter for antineoplastic chemotherapy: Secondary | ICD-10-CM

## 2016-11-01 LAB — CBC & DIFF AND RETIC
BASO%: 0 % (ref 0.0–2.0)
BASOS ABS: 0 10*3/uL (ref 0.0–0.1)
EOS ABS: 0.1 10*3/uL (ref 0.0–0.5)
EOS%: 1.2 % (ref 0.0–7.0)
HEMATOCRIT: 39.7 % (ref 38.4–49.9)
HEMOGLOBIN: 13.8 g/dL (ref 13.0–17.1)
IMMATURE RETIC FRACT: 1.1 % — AB (ref 3.00–10.60)
LYMPH#: 1.5 10*3/uL (ref 0.9–3.3)
LYMPH%: 25 % (ref 14.0–49.0)
MCH: 33.7 pg — ABNORMAL HIGH (ref 27.2–33.4)
MCHC: 34.8 g/dL (ref 32.0–36.0)
MCV: 96.8 fL (ref 79.3–98.0)
MONO#: 0.8 10*3/uL (ref 0.1–0.9)
MONO%: 13.9 % (ref 0.0–14.0)
NEUT#: 3.6 10*3/uL (ref 1.5–6.5)
NEUT%: 59.9 % (ref 39.0–75.0)
PLATELETS: 262 10*3/uL (ref 140–400)
RBC: 4.1 10*6/uL — AB (ref 4.20–5.82)
RDW: 12.8 % (ref 11.0–14.6)
RETIC CT ABS: 60.27 10*3/uL (ref 34.80–93.90)
Retic %: 1.47 % (ref 0.80–1.80)
WBC: 6 10*3/uL (ref 4.0–10.3)

## 2016-11-01 LAB — COMPREHENSIVE METABOLIC PANEL
ALBUMIN: 2.4 g/dL — AB (ref 3.5–5.0)
ALK PHOS: 101 U/L (ref 40–150)
ALT: 30 U/L (ref 0–55)
ANION GAP: 7 meq/L (ref 3–11)
AST: 31 U/L (ref 5–34)
BILIRUBIN TOTAL: 0.33 mg/dL (ref 0.20–1.20)
BUN: 23.8 mg/dL (ref 7.0–26.0)
CO2: 24 meq/L (ref 22–29)
CREATININE: 0.9 mg/dL (ref 0.7–1.3)
Calcium: 8.6 mg/dL (ref 8.4–10.4)
Chloride: 100 mEq/L (ref 98–109)
EGFR: 81 mL/min/{1.73_m2} — ABNORMAL LOW (ref >90)
GLUCOSE: 124 mg/dL (ref 70–140)
Potassium: 4.5 mEq/L (ref 3.5–5.1)
Sodium: 132 mEq/L — ABNORMAL LOW (ref 136–145)
TOTAL PROTEIN: 5.1 g/dL — AB (ref 6.4–8.3)

## 2016-11-01 MED ORDER — SODIUM CHLORIDE 0.9 % IV SOLN
Freq: Once | INTRAVENOUS | Status: AC
  Administered 2016-11-01: 15:00:00 via INTRAVENOUS

## 2016-11-01 MED ORDER — SODIUM CHLORIDE 0.9 % IV SOLN
40.0000 mg | Freq: Once | INTRAVENOUS | Status: AC
  Administered 2016-11-01: 40 mg via INTRAVENOUS
  Filled 2016-11-01: qty 4

## 2016-11-01 MED ORDER — PALONOSETRON HCL INJECTION 0.25 MG/5ML
0.2500 mg | Freq: Once | INTRAVENOUS | Status: AC
  Administered 2016-11-01: 0.25 mg via INTRAVENOUS

## 2016-11-01 MED ORDER — SODIUM CHLORIDE 0.9 % IV SOLN
300.0000 mg/m2 | Freq: Once | INTRAVENOUS | Status: AC
  Administered 2016-11-01: 580 mg via INTRAVENOUS
  Filled 2016-11-01: qty 29

## 2016-11-01 MED ORDER — BORTEZOMIB CHEMO SQ INJECTION 3.5 MG (2.5MG/ML)
1.5000 mg/m2 | Freq: Once | INTRAMUSCULAR | Status: AC
  Administered 2016-11-01: 3 mg via SUBCUTANEOUS
  Filled 2016-11-01: qty 3

## 2016-11-01 MED ORDER — PALONOSETRON HCL INJECTION 0.25 MG/5ML
INTRAVENOUS | Status: AC
  Filled 2016-11-01: qty 5

## 2016-11-01 NOTE — Patient Instructions (Signed)
Thank you for choosing [facility] to provide your oncology and hematology care.  To afford each patient quality time with our providers, please arrive 30 minutes before your scheduled appointment time.  If you arrive late for your appointment, you may be asked to reschedule.  We strive to give you quality time with our providers, and arriving late affects you and other patients whose appointments are after yours.   If you are a no show for multiple scheduled visits, you may be dismissed from the clinic at the providers discretion.    Again, thank you for choosing [facility], our hope is that these requests will decrease the amount of time that you wait before being seen by our physicians.  ______________________________________________________________________  Should you have questions after your visit to the  Cancer Center, please contact our office at [phone] between the hours of 8:30 and 4:30 p.m.    Voicemails left after 4:30p.m will not be returned until the following business day.    For prescription refill requests, please have your pharmacy contact us directly.  Please also try to allow 48 hours for prescription requests.    Please contact the scheduling department for questions regarding scheduling.  For scheduling of procedures such as PET scans, CT scans, MRI, Ultrasound, etc please contact central scheduling at [phone].    Resources For Cancer Patients and Caregivers:   Oncolink.org:  A wonderful resource for patients and healthcare providers for information regarding your disease, ways to tract your treatment, what to expect, etc.     American Cancer Society:  800-227-2345  Can help patients locate various types of support and financial assistance  Cancer Care: 1-800-813-HOPE (4673) Provides financial assistance, online support groups, medication/co-pay assistance.    [place] DSS:  336-641-3447 Where to apply for food  stamps, Medicaid, and utility assistance  Medicare Rights Center: 800-333-4114 Helps people with Medicare understand their rights and benefits, navigate the Medicare system, and secure the quality healthcare they deserve  SCAT: 336-333-6589 [place] [employer]'s shared-ride transportation service for eligible riders who have a disability that prevents them from riding the fixed route bus.    For additional information on assistance programs please contact our social worker:   Grier Hock/Abigail Elmore:  [phone]            

## 2016-11-01 NOTE — Patient Instructions (Signed)
[facility] Discharge Instructions for Patients Receiving Chemotherapy  Today you received the following chemotherapy agents Cytoxan and Velcade  To help prevent nausea and vomiting after your treatment, we encourage you to take your nausea medication as directed. No Zofran for 3 days. Take Compazine instead.    If you develop nausea and vomiting that is not controlled by your nausea medication, call the clinic.   BELOW ARE SYMPTOMS THAT SHOULD BE REPORTED IMMEDIATELY:  *FEVER GREATER THAN 100.5 F  *CHILLS WITH OR WITHOUT FEVER  NAUSEA AND VOMITING THAT IS NOT CONTROLLED WITH YOUR NAUSEA MEDICATION  *UNUSUAL SHORTNESS OF BREATH  *UNUSUAL BRUISING OR BLEEDING  TENDERNESS IN MOUTH AND THROAT WITH OR WITHOUT PRESENCE OF ULCERS  *URINARY PROBLEMS  *BOWEL PROBLEMS  UNUSUAL RASH Items with * indicate a potential emergency and should be followed up as soon as possible.  Feel free to call the clinic you have any questions or concerns. The clinic phone number is [phone].  Please show the [place] at check-in to the Emergency Department and triage nurse.

## 2016-11-02 ENCOUNTER — Telehealth: Payer: Self-pay | Admitting: Hematology

## 2016-11-02 NOTE — Telephone Encounter (Signed)
Scheduled appt per 9/18 los - patient is aware of appts added - my chart active

## 2016-11-08 ENCOUNTER — Encounter (HOSPITAL_COMMUNITY): Payer: Self-pay

## 2016-11-08 ENCOUNTER — Ambulatory Visit (HOSPITAL_BASED_OUTPATIENT_CLINIC_OR_DEPARTMENT_OTHER): Payer: Medicare Other

## 2016-11-08 ENCOUNTER — Other Ambulatory Visit (HOSPITAL_BASED_OUTPATIENT_CLINIC_OR_DEPARTMENT_OTHER): Payer: Medicare Other

## 2016-11-08 VITALS — BP 104/68 | HR 81 | Temp 99.5°F | Resp 16

## 2016-11-08 DIAGNOSIS — E8581 Light chain (AL) amyloidosis: Secondary | ICD-10-CM | POA: Diagnosis not present

## 2016-11-08 DIAGNOSIS — Z5112 Encounter for antineoplastic immunotherapy: Secondary | ICD-10-CM

## 2016-11-08 DIAGNOSIS — Z5111 Encounter for antineoplastic chemotherapy: Secondary | ICD-10-CM | POA: Diagnosis not present

## 2016-11-08 DIAGNOSIS — I959 Hypotension, unspecified: Secondary | ICD-10-CM

## 2016-11-08 LAB — CBC & DIFF AND RETIC
BASO%: 0.4 % (ref 0.0–2.0)
Basophils Absolute: 0 10*3/uL (ref 0.0–0.1)
EOS%: 2.2 % (ref 0.0–7.0)
Eosinophils Absolute: 0.1 10*3/uL (ref 0.0–0.5)
HCT: 39.6 % (ref 38.4–49.9)
HGB: 13.7 g/dL (ref 13.0–17.1)
IMMATURE RETIC FRACT: 1.1 % — AB (ref 3.00–10.60)
LYMPH#: 1.1 10*3/uL (ref 0.9–3.3)
LYMPH%: 21.7 % (ref 14.0–49.0)
MCH: 33.9 pg — ABNORMAL HIGH (ref 27.2–33.4)
MCHC: 34.6 g/dL (ref 32.0–36.0)
MCV: 98 fL (ref 79.3–98.0)
MONO#: 0.7 10*3/uL (ref 0.1–0.9)
MONO%: 14.3 % — ABNORMAL HIGH (ref 0.0–14.0)
NEUT%: 61.4 % (ref 39.0–75.0)
NEUTROS ABS: 3.1 10*3/uL (ref 1.5–6.5)
Platelets: 209 10*3/uL (ref 140–400)
RBC: 4.04 10*6/uL — AB (ref 4.20–5.82)
RDW: 13 % (ref 11.0–14.6)
RETIC %: 1.01 % (ref 0.80–1.80)
RETIC CT ABS: 40.8 10*3/uL (ref 34.80–93.90)
WBC: 5 10*3/uL (ref 4.0–10.3)

## 2016-11-08 LAB — COMPREHENSIVE METABOLIC PANEL
ALT: 24 U/L (ref 0–55)
AST: 32 U/L (ref 5–34)
Albumin: 2.5 g/dL — ABNORMAL LOW (ref 3.5–5.0)
Alkaline Phosphatase: 90 U/L (ref 40–150)
Anion Gap: 4 mEq/L (ref 3–11)
BILIRUBIN TOTAL: 0.36 mg/dL (ref 0.20–1.20)
BUN: 18.7 mg/dL (ref 7.0–26.0)
CO2: 29 meq/L (ref 22–29)
CREATININE: 0.9 mg/dL (ref 0.7–1.3)
Calcium: 9 mg/dL (ref 8.4–10.4)
Chloride: 101 mEq/L (ref 98–109)
EGFR: 84 mL/min/{1.73_m2} — ABNORMAL LOW (ref >90)
GLUCOSE: 86 mg/dL (ref 70–140)
Potassium: 4.6 mEq/L (ref 3.5–5.1)
SODIUM: 133 meq/L — AB (ref 136–145)
TOTAL PROTEIN: 5.3 g/dL — AB (ref 6.4–8.3)

## 2016-11-08 MED ORDER — PALONOSETRON HCL INJECTION 0.25 MG/5ML
0.2500 mg | Freq: Once | INTRAVENOUS | Status: AC
  Administered 2016-11-08: 0.25 mg via INTRAVENOUS

## 2016-11-08 MED ORDER — SODIUM CHLORIDE 0.9 % IV SOLN
40.0000 mg | Freq: Once | INTRAVENOUS | Status: AC
  Administered 2016-11-08: 40 mg via INTRAVENOUS
  Filled 2016-11-08: qty 4

## 2016-11-08 MED ORDER — PALONOSETRON HCL INJECTION 0.25 MG/5ML
INTRAVENOUS | Status: AC
  Filled 2016-11-08: qty 5

## 2016-11-08 MED ORDER — SODIUM CHLORIDE 0.9 % IV SOLN
300.0000 mg/m2 | Freq: Once | INTRAVENOUS | Status: AC
  Administered 2016-11-08: 580 mg via INTRAVENOUS
  Filled 2016-11-08: qty 29

## 2016-11-08 MED ORDER — BORTEZOMIB CHEMO SQ INJECTION 3.5 MG (2.5MG/ML)
1.5000 mg/m2 | Freq: Once | INTRAMUSCULAR | Status: AC
  Administered 2016-11-08: 3 mg via SUBCUTANEOUS
  Filled 2016-11-08: qty 3

## 2016-11-08 MED ORDER — SODIUM CHLORIDE 0.9 % IV SOLN
Freq: Once | INTRAVENOUS | Status: AC
  Administered 2016-11-08: 15:00:00 via INTRAVENOUS

## 2016-11-08 MED ORDER — SODIUM CHLORIDE 0.9 % IV SOLN: Freq: Once | INTRAVENOUS | Status: DC

## 2016-11-08 NOTE — Progress Notes (Signed)
Pt's blood pressure 83/54.  Per Dr Irene Limbo pt to stop taking lisinopril.  Pt verbalizes understanding and states his nephrologist, Dr Joelyn Oms, prescribes the lisinopril.  This RN called and spoke with Charlena Cross at Dr Adin Hector office at [facility] to inform them that Dr Irene Limbo has stopped the lisinopril.  Charlena Cross states she will relay information to Dr Joelyn Oms.  Pt advised to not take lisinopril until he hears back from Dr Adin Hector office.  Pt verbalizes understanding

## 2016-11-08 NOTE — Patient Instructions (Signed)
[facility] Discharge Instructions for Patients Receiving Chemotherapy  Today you received the following chemotherapy agents:  Cytoxan, Velcade  To help prevent nausea and vomiting after your treatment, we encourage you to take your nausea medication as prescribed.   If you develop nausea and vomiting that is not controlled by your nausea medication, call the clinic.   BELOW ARE SYMPTOMS THAT SHOULD BE REPORTED IMMEDIATELY:  *FEVER GREATER THAN 100.5 F  *CHILLS WITH OR WITHOUT FEVER  NAUSEA AND VOMITING THAT IS NOT CONTROLLED WITH YOUR NAUSEA MEDICATION  *UNUSUAL SHORTNESS OF BREATH  *UNUSUAL BRUISING OR BLEEDING  TENDERNESS IN MOUTH AND THROAT WITH OR WITHOUT PRESENCE OF ULCERS  *URINARY PROBLEMS  *BOWEL PROBLEMS  UNUSUAL RASH Items with * indicate a potential emergency and should be followed up as soon as possible.  Feel free to call the clinic should you have any questions or concerns. The clinic phone number is [phone].  Please show the [place] at check-in to the Emergency Department and triage nurse.

## 2016-11-11 ENCOUNTER — Telehealth: Payer: Self-pay

## 2016-11-11 DIAGNOSIS — R319 Hematuria, unspecified: Secondary | ICD-10-CM

## 2016-11-11 DIAGNOSIS — E8581 Light chain (AL) amyloidosis: Secondary | ICD-10-CM

## 2016-11-11 LAB — CHROMOSOME ANALYSIS, BONE MARROW

## 2016-11-11 LAB — TISSUE HYBRIDIZATION (BONE MARROW)-NCBH

## 2016-11-11 NOTE — Telephone Encounter (Signed)
S/w Samantha and called pt. He is to get a UA next week at PCP or [place]. Pt stated he has appt on Tuesday and will get it then. Instructed him if any new symptoms or the bleeding worsens to go to ER or urgent care over the weekend.

## 2016-11-11 NOTE — Telephone Encounter (Signed)
Pt called with hematuria. Started yesterday noticed blood on 4-5 diff places in underwear, after work last night. This am saw spots on underwear again. No pain with urination, urine is clear.   S/w Dr Irene Limbo and he had already given [facility] instructions. Called Samantha and asked her to call pt back. He is expecting her call.

## 2016-11-14 ENCOUNTER — Telehealth: Payer: Self-pay | Admitting: Hematology

## 2016-11-14 NOTE — Telephone Encounter (Signed)
11/10/16 team health phone message.  Please call pt °

## 2016-11-15 ENCOUNTER — Other Ambulatory Visit (HOSPITAL_BASED_OUTPATIENT_CLINIC_OR_DEPARTMENT_OTHER): Payer: Medicare Other

## 2016-11-15 ENCOUNTER — Ambulatory Visit (HOSPITAL_BASED_OUTPATIENT_CLINIC_OR_DEPARTMENT_OTHER): Payer: Medicare Other

## 2016-11-15 VITALS — BP 130/78 | HR 75 | Temp 97.7°F | Resp 17

## 2016-11-15 DIAGNOSIS — Z5111 Encounter for antineoplastic chemotherapy: Secondary | ICD-10-CM

## 2016-11-15 DIAGNOSIS — R319 Hematuria, unspecified: Secondary | ICD-10-CM

## 2016-11-15 DIAGNOSIS — Z5112 Encounter for antineoplastic immunotherapy: Secondary | ICD-10-CM

## 2016-11-15 DIAGNOSIS — Z23 Encounter for immunization: Secondary | ICD-10-CM | POA: Diagnosis not present

## 2016-11-15 DIAGNOSIS — E8581 Light chain (AL) amyloidosis: Secondary | ICD-10-CM | POA: Diagnosis not present

## 2016-11-15 LAB — COMPREHENSIVE METABOLIC PANEL
ALT: 25 U/L (ref 0–55)
AST: 31 U/L (ref 5–34)
Albumin: 2.4 g/dL — ABNORMAL LOW (ref 3.5–5.0)
Alkaline Phosphatase: 81 U/L (ref 40–150)
Anion Gap: 8 mEq/L (ref 3–11)
BILIRUBIN TOTAL: 0.37 mg/dL (ref 0.20–1.20)
BUN: 12.7 mg/dL (ref 7.0–26.0)
CHLORIDE: 99 meq/L (ref 98–109)
CO2: 26 meq/L (ref 22–29)
Calcium: 8.6 mg/dL (ref 8.4–10.4)
Creatinine: 0.8 mg/dL (ref 0.7–1.3)
EGFR: 86 mL/min/{1.73_m2} — AB (ref >90)
Glucose: 112 mg/dl (ref 70–140)
Potassium: 4 mEq/L (ref 3.5–5.1)
SODIUM: 133 meq/L — AB (ref 136–145)
TOTAL PROTEIN: 4.9 g/dL — AB (ref 6.4–8.3)

## 2016-11-15 LAB — CBC & DIFF AND RETIC
BASO%: 0.2 % (ref 0.0–2.0)
Basophils Absolute: 0 10*3/uL (ref 0.0–0.1)
EOS ABS: 0.2 10*3/uL (ref 0.0–0.5)
EOS%: 3.5 % (ref 0.0–7.0)
HCT: 35.5 % — ABNORMAL LOW (ref 38.4–49.9)
HGB: 12.4 g/dL — ABNORMAL LOW (ref 13.0–17.1)
Immature Retic Fract: 4.4 % (ref 3.00–10.60)
LYMPH%: 18.2 % (ref 14.0–49.0)
MCH: 33.7 pg — ABNORMAL HIGH (ref 27.2–33.4)
MCHC: 34.9 g/dL (ref 32.0–36.0)
MCV: 96.5 fL (ref 79.3–98.0)
MONO#: 0.4 10*3/uL (ref 0.1–0.9)
MONO%: 8.4 % (ref 0.0–14.0)
NEUT%: 69.7 % (ref 39.0–75.0)
NEUTROS ABS: 3 10*3/uL (ref 1.5–6.5)
Platelets: 208 10*3/uL (ref 140–400)
RBC: 3.68 10*6/uL — AB (ref 4.20–5.82)
RDW: 13.1 % (ref 11.0–14.6)
RETIC %: 1.62 % (ref 0.80–1.80)
Retic Ct Abs: 59.62 10*3/uL (ref 34.80–93.90)
WBC: 4.3 10*3/uL (ref 4.0–10.3)
lymph#: 0.8 10*3/uL — ABNORMAL LOW (ref 0.9–3.3)

## 2016-11-15 LAB — URINALYSIS, MICROSCOPIC - CHCC
BILIRUBIN (URINE): NEGATIVE
Blood: NEGATIVE
Glucose: NEGATIVE mg/dL
KETONES: NEGATIVE mg/dL
Leukocyte Esterase: NEGATIVE
Nitrite: NEGATIVE
PROTEIN: 300 mg/dL
RBC / HPF: NEGATIVE (ref 0–2)
SPECIFIC GRAVITY, URINE: 1.015 (ref 1.003–1.035)
Urobilinogen, UR: 0.2 mg/dL (ref 0.2–1)
pH: 7.5 (ref 4.6–8.0)

## 2016-11-15 MED ORDER — SODIUM CHLORIDE 0.9 % IV SOLN
40.0000 mg | Freq: Once | INTRAVENOUS | Status: AC
  Administered 2016-11-15: 40 mg via INTRAVENOUS
  Filled 2016-11-15: qty 4

## 2016-11-15 MED ORDER — SODIUM CHLORIDE 0.9 % IV SOLN
300.0000 mg/m2 | Freq: Once | INTRAVENOUS | Status: AC
  Administered 2016-11-15: 580 mg via INTRAVENOUS
  Filled 2016-11-15: qty 29

## 2016-11-15 MED ORDER — PALONOSETRON HCL INJECTION 0.25 MG/5ML
INTRAVENOUS | Status: AC
  Filled 2016-11-15: qty 5

## 2016-11-15 MED ORDER — PALONOSETRON HCL INJECTION 0.25 MG/5ML
0.2500 mg | Freq: Once | INTRAVENOUS | Status: AC
  Administered 2016-11-15: 0.25 mg via INTRAVENOUS

## 2016-11-15 MED ORDER — BORTEZOMIB CHEMO SQ INJECTION 3.5 MG (2.5MG/ML)
1.5000 mg/m2 | Freq: Once | INTRAMUSCULAR | Status: AC
  Administered 2016-11-15: 3 mg via SUBCUTANEOUS
  Filled 2016-11-15: qty 3

## 2016-11-15 MED ORDER — SODIUM CHLORIDE 0.9 % IV SOLN
Freq: Once | INTRAVENOUS | Status: AC
  Administered 2016-11-15: 11:00:00 via INTRAVENOUS

## 2016-11-15 MED ORDER — INFLUENZA VAC SPLIT QUAD 0.5 ML IM SUSY
0.5000 mL | PREFILLED_SYRINGE | Freq: Once | INTRAMUSCULAR | Status: AC
  Administered 2016-11-15: 0.5 mL via INTRAMUSCULAR
  Filled 2016-11-15: qty 0.5

## 2016-11-15 NOTE — Patient Instructions (Signed)
[facility] Discharge Instructions for Patients Receiving Chemotherapy  Today you received the following chemotherapy agents Cytoxan, Velcade  To help prevent nausea and vomiting after your treatment, we encourage you to take your nausea medication as directed   If you develop nausea and vomiting that is not controlled by your nausea medication, call the clinic.   BELOW ARE SYMPTOMS THAT SHOULD BE REPORTED IMMEDIATELY:  *FEVER GREATER THAN 100.5 F  *CHILLS WITH OR WITHOUT FEVER  NAUSEA AND VOMITING THAT IS NOT CONTROLLED WITH YOUR NAUSEA MEDICATION  *UNUSUAL SHORTNESS OF BREATH  *UNUSUAL BRUISING OR BLEEDING  TENDERNESS IN MOUTH AND THROAT WITH OR WITHOUT PRESENCE OF ULCERS  *URINARY PROBLEMS  *BOWEL PROBLEMS  UNUSUAL RASH Items with * indicate a potential emergency and should be followed up as soon as possible.  Feel free to call the clinic should you have any questions or concerns. The clinic phone number is [phone].  Please show the CHEMO ALERT CARD at check-in to the Emergency Department and triage nurse.   

## 2016-11-16 LAB — KAPPA/LAMBDA LIGHT CHAINS
IG LAMBDA FREE LIGHT CHAIN: 63.2 mg/L — AB (ref 5.7–26.3)
Ig Kappa Free Light Chain: 7.1 mg/L (ref 3.3–19.4)
KAPPA/LAMBDA FLC RATIO: 0.11 — AB (ref 0.26–1.65)

## 2016-11-16 LAB — URINE CULTURE: ORGANISM ID, BACTERIA: NO GROWTH

## 2016-11-17 ENCOUNTER — Encounter: Payer: Self-pay | Admitting: Internal Medicine

## 2016-11-18 ENCOUNTER — Ambulatory Visit (INDEPENDENT_AMBULATORY_CARE_PROVIDER_SITE_OTHER): Payer: Medicare Other | Admitting: Internal Medicine

## 2016-11-18 ENCOUNTER — Encounter: Payer: Self-pay | Admitting: Internal Medicine

## 2016-11-18 VITALS — BP 124/70 | HR 74 | Temp 97.9°F | Resp 16 | Ht 68.0 in | Wt 176.0 lb

## 2016-11-18 DIAGNOSIS — Z1212 Encounter for screening for malignant neoplasm of rectum: Secondary | ICD-10-CM

## 2016-11-18 DIAGNOSIS — Z136 Encounter for screening for cardiovascular disorders: Secondary | ICD-10-CM | POA: Diagnosis not present

## 2016-11-18 DIAGNOSIS — E782 Mixed hyperlipidemia: Secondary | ICD-10-CM

## 2016-11-18 DIAGNOSIS — Z0001 Encounter for general adult medical examination with abnormal findings: Secondary | ICD-10-CM

## 2016-11-18 DIAGNOSIS — I10 Essential (primary) hypertension: Secondary | ICD-10-CM | POA: Diagnosis not present

## 2016-11-18 DIAGNOSIS — E8581 Light chain (AL) amyloidosis: Secondary | ICD-10-CM

## 2016-11-18 DIAGNOSIS — E559 Vitamin D deficiency, unspecified: Secondary | ICD-10-CM

## 2016-11-18 DIAGNOSIS — Z125 Encounter for screening for malignant neoplasm of prostate: Secondary | ICD-10-CM

## 2016-11-18 DIAGNOSIS — R6889 Other general symptoms and signs: Secondary | ICD-10-CM | POA: Diagnosis not present

## 2016-11-18 DIAGNOSIS — K219 Gastro-esophageal reflux disease without esophagitis: Secondary | ICD-10-CM

## 2016-11-18 DIAGNOSIS — Z79899 Other long term (current) drug therapy: Secondary | ICD-10-CM

## 2016-11-18 DIAGNOSIS — Z1211 Encounter for screening for malignant neoplasm of colon: Secondary | ICD-10-CM

## 2016-11-18 DIAGNOSIS — E039 Hypothyroidism, unspecified: Secondary | ICD-10-CM

## 2016-11-18 DIAGNOSIS — R7303 Prediabetes: Secondary | ICD-10-CM | POA: Diagnosis not present

## 2016-11-18 LAB — MULTIPLE MYELOMA PANEL, SERUM
ALBUMIN/GLOB SERPL: 1.1 (ref 0.7–1.7)
ALPHA 1: 0.2 g/dL (ref 0.0–0.4)
Albumin SerPl Elph-Mcnc: 2.2 g/dL — ABNORMAL LOW (ref 2.9–4.4)
Alpha2 Glob SerPl Elph-Mcnc: 0.8 g/dL (ref 0.4–1.0)
B-Globulin SerPl Elph-Mcnc: 0.7 g/dL (ref 0.7–1.3)
GAMMA GLOB SERPL ELPH-MCNC: 0.4 g/dL (ref 0.4–1.8)
GLOBULIN, TOTAL: 2.1 g/dL — AB (ref 2.2–3.9)
IGA/IMMUNOGLOBULIN A, SERUM: 53 mg/dL — AB (ref 61–437)
IgG, Qn, Serum: 284 mg/dL — ABNORMAL LOW (ref 700–1600)
IgM, Qn, Serum: 17 mg/dL (ref 15–143)
M Protein SerPl Elph-Mcnc: 0.2 g/dL — ABNORMAL HIGH
Total Protein: 4.3 g/dL — ABNORMAL LOW (ref 6.0–8.5)

## 2016-11-18 NOTE — Patient Instructions (Signed)

## 2016-11-18 NOTE — Progress Notes (Signed)
[facility]   Unk Pinto, M.D.     Uvaldo Bristle. Silverio Lay, P.A.-C Liane Comber, [place]                [address] [place], [place] [address] Telephone [phone] Telefax [phone] Annual  Screening/Preventative Visit  & Comprehensive Evaluation & Examination     This very nice 76 y.o. single WM  presents for a Screening/Preventative Visit & comprehensive evaluation and management of multiple medical co-morbidities.  Patient has been followed for HTN, Prediabetes, Hyperlipidemia and Vitamin D Deficiency.      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      HTN predates since 1989. Patient's BP has been controlled at home.  Today's BP is at goal - 124/70. Patient denies any cardiac symptoms as chest pain, palpitations, shortness of breath, dizziness or ankle swelling.     Patient's hyperlipidemia is controlled with diet and medications. Patient denies myalgias or other medication SE's. Last lipids were not at goal off treatment: Lab Results  Component Value Date   CHOL 249 (H) 08/15/2016   HDL 90 08/15/2016   LDLCALC 120 (H) 08/15/2016   TRIG 196 (H) 08/15/2016   CHOLHDL 2.8 08/15/2016      Patient has prediabetes with A1c 5.8% (2014)  and patient denies reactive hypoglycemic symptoms, visual blurring, diabetic polys or paresthesias. Last A1c was normal and back at goal: Lab Results  Component Value Date   HGBA1C 4.8 05/04/2016       Patient has been on Thyroid Replacement since 1997.   Finally, patient has history of Vitamin D Deficiency ("24" in 2008) and last vitamin D was at goal: Lab Results  Component Value Date   VD25OH 60 05/04/2016   Current Outpatient Prescriptions on File Prior to Visit  Medication Sig  . acyclovir (ZOVIRAX) 400 MG tablet Take 1 tablet (400 mg total) by mouth 2 (two) times daily.  [name] ALPRAZolam  (XANAX) 0.5 MG tablet Take 1/2 to 1 tablet at bedtime PRN.  [name] Cholecalciferol (VITAMIN D) 2000 units tablet Take 8,000 Units by mouth daily.  [name] dexamethasone (DECADRON) 4 MG tablet Take 2 tablets (8 mg total) by mouth daily. Start the day after chemotherapy for 2 days.  . furosemide (LASIX) 40 MG tablet 1-2 pills a day for swelling (Patient taking differently: Take 80 mg by mouth daily. )  . latanoprost (XALATAN) 0.005 % ophthalmic solution Place 1 drop into both eyes at bedtime.   [name] levothyroxine (SYNTHROID, LEVOTHROID) 175 MCG tablet take 1 and 1/2 tablets by mouth once daily or as directed  . lisinopril (PRINIVIL,ZESTRIL) 20 MG tablet Take 20 mg by mouth daily.  . Magnesium 100 MG TABS Take 300 mg by mouth daily.  . milk thistle 175 MG tablet Take 175 mg by mouth daily.  . Multiple Vitamins-Minerals (MULTIVITAMIN WITH MINERALS) tablet Take 1 tablet by mouth daily.  . ondansetron (ZOFRAN) 8 MG tablet Take 1 tablet (8 mg total) by mouth 2 (two) times daily as needed for refractory nausea / vomiting. Start on day 3 after chemo.  [name] PARoxetine (PAXIL) 20 MG tablet Take 10 mg by mouth daily.  . prochlorperazine (COMPAZINE) 10 MG tablet Take 1 tablet (10 mg total) by mouth every 6 (six) hours as needed (Nausea or vomiting).  [name]  Turmeric 500 MG CAPS Take 1,500 mg elemental calcium/kg/hr by mouth daily.   No current facility-administered medications on file prior to visit.    Allergies  Allergen Reactions  . 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 Sulfonamide Derivatives Itching and Swelling  . Penicillins Swelling and Rash   Past Medical History:  Diagnosis Date  . BPH (benign prostatic hyperplasia)   . Hyperlipidemia   . Hypertension   . Hypogonadism, male   . Pre-diabetes   . Prostatitis   . Thyroid disease    Health Maintenance  Topic Date Due  . PNA vac Low Risk Adult (1 of 2 - PCV13) 12/01/2016 (Originally 02/26/2008)  . COLONOSCOPY   04/25/2019  . TETANUS/TDAP  02/03/2025  . INFLUENZA VACCINE  Completed   Immunization History  Administered Date(s) Administered  . DT 02/04/2015  . DTaP 01/15/2004  . Influenza, High Dose Seasonal PF 12/26/2013, 11/05/2014, 11/12/2015  . Influenza,inj,Quad PF,6+ Mos 11/15/2016  . Pneumococcal Polysaccharide-23 02/26/2007   Past Surgical History:  Procedure Laterality Date  . APPENDECTOMY     Family History  Problem Relation Age of Onset  . Cancer Mother        breast  . Alzheimer's disease Mother   . Hypertension Father   . Heart disease Father   . Lymphoma Father   . Hypertension Brother   . Heart disease Brother   . Heart disease Brother   . Hypertension Brother   . Diabetes Brother    Social History   Social History  . Marital status: Divorced    Spouse name: N/A  . Number of children: N/A  . Years of education: N/A   Occupational History  . Not on file.   Social History Main Topics  . Smoking status: Former Smoker    Quit date: 12/22/2013  . Smokeless tobacco: Never Used  . Alcohol use 16.8 oz/week    28 Standard drinks or equivalent per week     Comment: drinks wine  . Drug use: No  . Sexual activity: Not on file   Other Topics Concern  . Not on file   Social History Narrative  . No narrative on file    ROS Constitutional: Denies fever, chills, weight loss/gain, headaches, insomnia,  night sweats or change in appetite. Does c/o fatigue. Eyes: Denies redness, blurred vision, diplopia, discharge, itchy or watery eyes.  ENT: Denies discharge, congestion, post nasal drip, epistaxis, sore throat, earache, hearing loss, dental pain, Tinnitus, Vertigo, Sinus pain or snoring.  Cardio: Denies chest pain, palpitations, irregular heartbeat, syncope, dyspnea, diaphoresis, orthopnea, PND, claudication or edema Respiratory: denies cough, dyspnea, DOE, pleurisy, hoarseness, laryngitis or wheezing.  Gastrointestinal: Denies dysphagia, heartburn, reflux, water  brash, pain, cramps, nausea, vomiting, bloating, diarrhea, constipation, hematemesis, melena, hematochezia, jaundice or hemorrhoids Genitourinary: Denies dysuria, frequency, urgency, nocturia, hesitancy, discharge, hematuria or flank pain Musculoskeletal: Denies arthralgia, myalgia, stiffness, Jt. Swelling, pain, limp or strain/sprain. Denies Falls. Skin: Denies puritis, rash, hives, warts, acne, eczema or change in skin lesion Neuro: No weakness, tremor, incoordination, spasms, paresthesia or pain Psychiatric: Denies confusion, memory loss or sensory loss. Denies Depression. Endocrine: Denies change in weight, skin, hair change, nocturia, and paresthesia, diabetic polys, visual blurring or hyper / hypo glycemic episodes.  Heme/Lymph: No excessive bleeding, bruising or enlarged lymph nodes.  Physical Exam  BP 124/70   Pulse 74   Temp 97.9 F (36.6 C)   Resp 16   Ht 5\' 8"  (1.727 m)  Wt 176 lb (79.8 kg)   SpO2 99%   BMI 26.76 kg/m   General Appearance: Well nourished and well groomed and in no apparent distress.  Eyes: PERRLA, EOMs, conjunctiva no swelling or erythema, normal fundi and vessels. Sinuses: No frontal/maxillary tenderness ENT/Mouth: EACs patent / TMs  nl. Nares clear without erythema, swelling, mucoid exudates. Oral hygiene is good. No erythema, swelling, or exudate. Tongue normal, non-obstructing. Tonsils not swollen or erythematous. Hearing normal.  Neck: Supple, thyroid normal. No bruits, nodes or JVD. Respiratory: Respiratory effort normal.  BS equal and clear bilateral without rales, rhonci, wheezing or stridor. Cardio: Heart sounds are normal with regular rate and rhythm and no murmurs, rubs or gallops. Peripheral pulses are normal and equal bilaterally without edema. No aortic or femoral bruits. Chest: symmetric with normal excursions and percussion.  Abdomen: Soft, with Nl bowel sounds. Nontender, no guarding, rebound, hernias, masses, or organomegaly.  Lymphatics:  Non tender without lymphadenopathy.  Genitourinary: No hernias.Testes nl. DRE - prostate nl for age - smooth & firm w/o nodules. Musculoskeletal: Full ROM all peripheral extremities, joint stability, 5/5 strength, and normal gait. Skin: Warm and dry without rashes, lesions, cyanosis, clubbing or  ecchymosis.  Neuro: Cranial nerves intact, reflexes equal bilaterally. Normal muscle tone, no cerebellar symptoms. Sensation intact.  Pysch: Alert and oriented X 3 with normal affect, insight and judgment appropriate.   Assessment and Plan  1. Annual Preventative/Screening Exam   2. Essential hypertension  - EKG 12-Lead - [place], RETROPERITNL ABD,  LTD - Microalbumin / creatinine urine ratio - Urinalysis, Routine w reflex microscopic - CBC with Differential/Platelet - BASIC METABOLIC PANEL WITH GFR - Magnesium - TSH  3. Hyperlipidemia, mixed  - EKG 12-Lead - [place], RETROPERITNL ABD,  LTD - Hepatic function panel - Lipid panel - TSH  4. Prediabetes  - EKG 12-Lead - [place], RETROPERITNL ABD,  LTD - Hemoglobin A1c - Insulin, random  5. Vitamin D deficiency  - VITAMIN D 25 Hydroxy   6. Light chain (AL) amyloidosis (HCC)   7. Hypothyroidism  - TSH  8. Gastroesophageal reflux disease  - CBC with Differential/Platelet  9. Screening for colorectal cancer  - POC Hemoccult Bld/Stl  10. Screening for prostate cancer   11. Screening for ischemic heart disease  - EKG 12-Lead  12. Screening for AAA (aortic abdominal aneurysm)  - [place], RETROPERITNL ABD,  LTD  13. Medication management  - Uric acid - CBC with Differential/Platelet - BASIC METABOLIC PANEL WITH GFR - Hepatic function panel - Magnesium - Lipid panel - TSH - Hemoglobin A1c - Insulin, random - VITAMIN D 25 Hydroxy         Patient was counseled in prudent diet, weight control to achieve/maintain BMI less than 25, BP monitoring, regular exercise and medications as discussed.  Discussed med effects and SE's.  Routine screening labs and tests as requested with regular follow-up as recommended. Over 40 minutes of exam, counseling, chart review and high complex critical decision making was performed

## 2016-11-19 ENCOUNTER — Encounter: Payer: Self-pay | Admitting: Internal Medicine

## 2016-11-19 ENCOUNTER — Other Ambulatory Visit: Payer: Self-pay | Admitting: Internal Medicine

## 2016-11-19 MED ORDER — EZETIMIBE 10 MG PO TABS: ORAL_TABLET | ORAL | 1 refills | Status: AC

## 2016-11-21 LAB — CBC WITH DIFFERENTIAL/PLATELET
BASOS ABS: 0 {cells}/uL (ref 0–200)
Basophils Relative: 0 %
EOS ABS: 38 {cells}/uL (ref 15–500)
Eosinophils Relative: 0.8 %
HEMATOCRIT: 37.3 % — AB (ref 38.5–50.0)
HEMOGLOBIN: 13.1 g/dL — AB (ref 13.2–17.1)
LYMPHS ABS: 946 {cells}/uL (ref 850–3900)
MCH: 33.4 pg — AB (ref 27.0–33.0)
MCHC: 35.1 g/dL (ref 32.0–36.0)
MCV: 95.2 fL (ref 80.0–100.0)
MONOS PCT: 13.8 %
MPV: 10.8 fL (ref 7.5–12.5)
NEUTROS ABS: 3154 {cells}/uL (ref 1500–7800)
Neutrophils Relative %: 65.7 %
Platelets: 189 10*3/uL (ref 140–400)
RBC: 3.92 10*6/uL — ABNORMAL LOW (ref 4.20–5.80)
RDW: 13 % (ref 11.0–15.0)
Total Lymphocyte: 19.7 %
WBC mixed population: 662 cells/uL (ref 200–950)
WBC: 4.8 10*3/uL (ref 3.8–10.8)

## 2016-11-21 LAB — HEPATIC FUNCTION PANEL
AG RATIO: 1.5 (calc) (ref 1.0–2.5)
ALT: 28 U/L (ref 9–46)
AST: 33 U/L (ref 10–35)
Albumin: 2.9 g/dL — ABNORMAL LOW (ref 3.6–5.1)
Alkaline phosphatase (APISO): 76 U/L (ref 40–115)
Bilirubin, Direct: 0.1 mg/dL (ref 0.0–0.2)
GLOBULIN: 2 g/dL (ref 1.9–3.7)
Indirect Bilirubin: 0.3 mg/dL (calc) (ref 0.2–1.2)
Total Bilirubin: 0.4 mg/dL (ref 0.2–1.2)
Total Protein: 4.9 g/dL — ABNORMAL LOW (ref 6.1–8.1)

## 2016-11-21 LAB — LIPID PANEL
CHOL/HDL RATIO: 3.3 (calc) (ref <5.0)
CHOLESTEROL: 307 mg/dL — AB (ref <200)
HDL: 92 mg/dL (ref >40)
LDL CHOLESTEROL (CALC): 178 mg/dL — AB
Non-HDL Cholesterol (Calc): 215 mg/dL (calc) — ABNORMAL HIGH (ref <130)
Triglycerides: 206 mg/dL — ABNORMAL HIGH (ref <150)

## 2016-11-21 LAB — URINALYSIS, ROUTINE W REFLEX MICROSCOPIC
BACTERIA UA: NONE SEEN /HPF
Bilirubin Urine: NEGATIVE
GLUCOSE, UA: NEGATIVE
HGB URINE DIPSTICK: NEGATIVE
HYALINE CAST: NONE SEEN /LPF
KETONES UR: NEGATIVE
LEUKOCYTES UA: NEGATIVE
Nitrite: NEGATIVE
PH: 7.5 (ref 5.0–8.0)
Specific Gravity, Urine: 1.006 (ref 1.001–1.03)
Squamous Epithelial / LPF: NONE SEEN /HPF (ref <=5)
WBC UA: NONE SEEN /HPF (ref 0–5)

## 2016-11-21 LAB — VITAMIN D 25 HYDROXY (VIT D DEFICIENCY, FRACTURES): Vit D, 25-Hydroxy: 43 ng/mL (ref 30–100)

## 2016-11-21 LAB — MICROALBUMIN / CREATININE URINE RATIO
Creatinine, Urine: 33 mg/dL (ref 20–320)
MICROALB/CREAT RATIO: 2473 ug/mg{creat} — AB (ref <30)
Microalb, Ur: 81.6 mg/dL

## 2016-11-21 LAB — BASIC METABOLIC PANEL WITH GFR
BUN: 17 mg/dL (ref 7–25)
CO2: 29 mmol/L (ref 20–32)
CREATININE: 0.94 mg/dL (ref 0.70–1.18)
Calcium: 8.6 mg/dL (ref 8.6–10.3)
Chloride: 95 mmol/L — ABNORMAL LOW (ref 98–110)
GFR, EST NON AFRICAN AMERICAN: 80 mL/min/{1.73_m2} (ref >=60)
GFR, Est African American: 92 mL/min/{1.73_m2} (ref >=60)
Glucose, Bld: 114 mg/dL — ABNORMAL HIGH (ref 65–99)
Potassium: 3.8 mmol/L (ref 3.5–5.3)
SODIUM: 131 mmol/L — AB (ref 135–146)

## 2016-11-21 LAB — INSULIN, RANDOM: Insulin: 23.8 u[IU]/mL — ABNORMAL HIGH (ref 2.0–19.6)

## 2016-11-21 LAB — TSH: TSH: 7.62 mIU/L — ABNORMAL HIGH (ref 0.40–4.50)

## 2016-11-21 LAB — HEMOGLOBIN A1C
Hgb A1c MFr Bld: 5.5 % of total Hgb (ref <5.7)
MEAN PLASMA GLUCOSE: 111 (calc)
eAG (mmol/L): 6.2 (calc)

## 2016-11-21 LAB — URIC ACID: Uric Acid, Serum: 6.9 mg/dL (ref 4.0–8.0)

## 2016-11-21 LAB — MAGNESIUM: MAGNESIUM: 2 mg/dL (ref 1.5–2.5)

## 2016-11-22 ENCOUNTER — Ambulatory Visit (HOSPITAL_BASED_OUTPATIENT_CLINIC_OR_DEPARTMENT_OTHER): Payer: Medicare Other

## 2016-11-22 ENCOUNTER — Other Ambulatory Visit (HOSPITAL_BASED_OUTPATIENT_CLINIC_OR_DEPARTMENT_OTHER): Payer: Medicare Other

## 2016-11-22 VITALS — BP 127/84 | HR 82 | Temp 98.9°F | Resp 17

## 2016-11-22 DIAGNOSIS — Z5111 Encounter for antineoplastic chemotherapy: Secondary | ICD-10-CM | POA: Diagnosis not present

## 2016-11-22 DIAGNOSIS — Z5112 Encounter for antineoplastic immunotherapy: Secondary | ICD-10-CM | POA: Diagnosis not present

## 2016-11-22 DIAGNOSIS — E8581 Light chain (AL) amyloidosis: Secondary | ICD-10-CM

## 2016-11-22 LAB — COMPREHENSIVE METABOLIC PANEL
ALBUMIN: 2.3 g/dL — AB (ref 3.5–5.0)
ALK PHOS: 86 U/L (ref 40–150)
ALT: 25 U/L (ref 0–55)
AST: 36 U/L — AB (ref 5–34)
Anion Gap: 6 mEq/L (ref 3–11)
BILIRUBIN TOTAL: 0.53 mg/dL (ref 0.20–1.20)
BUN: 10.9 mg/dL (ref 7.0–26.0)
CO2: 27 meq/L (ref 22–29)
CREATININE: 0.8 mg/dL (ref 0.7–1.3)
Calcium: 8.5 mg/dL (ref 8.4–10.4)
Chloride: 98 mEq/L (ref 98–109)
EGFR: 88 mL/min/{1.73_m2} — ABNORMAL LOW (ref >90)
GLUCOSE: 108 mg/dL (ref 70–140)
Potassium: 4 mEq/L (ref 3.5–5.1)
SODIUM: 132 meq/L — AB (ref 136–145)
TOTAL PROTEIN: 4.8 g/dL — AB (ref 6.4–8.3)

## 2016-11-22 LAB — CBC & DIFF AND RETIC
BASO%: 0.2 % (ref 0.0–2.0)
Basophils Absolute: 0 10*3/uL (ref 0.0–0.1)
EOS ABS: 0.2 10*3/uL (ref 0.0–0.5)
EOS%: 3.7 % (ref 0.0–7.0)
HCT: 35.3 % — ABNORMAL LOW (ref 38.4–49.9)
HEMOGLOBIN: 12.4 g/dL — AB (ref 13.0–17.1)
IMMATURE RETIC FRACT: 6.4 % (ref 3.00–10.60)
LYMPH#: 0.5 10*3/uL — AB (ref 0.9–3.3)
LYMPH%: 11 % — AB (ref 14.0–49.0)
MCH: 33.6 pg — ABNORMAL HIGH (ref 27.2–33.4)
MCHC: 35.1 g/dL (ref 32.0–36.0)
MCV: 95.7 fL (ref 79.3–98.0)
MONO#: 0.5 10*3/uL (ref 0.1–0.9)
MONO%: 10.6 % (ref 0.0–14.0)
NEUT%: 74.5 % (ref 39.0–75.0)
NEUTROS ABS: 3.5 10*3/uL (ref 1.5–6.5)
Platelets: 213 10*3/uL (ref 140–400)
RBC: 3.69 10*6/uL — AB (ref 4.20–5.82)
RDW: 13.3 % (ref 11.0–14.6)
RETIC %: 2.29 % — AB (ref 0.80–1.80)
RETIC CT ABS: 84.5 10*3/uL (ref 34.80–93.90)
WBC: 4.6 10*3/uL (ref 4.0–10.3)

## 2016-11-22 MED ORDER — SODIUM CHLORIDE 0.9 % IV SOLN
Freq: Once | INTRAVENOUS | Status: AC
  Administered 2016-11-22: 13:00:00 via INTRAVENOUS

## 2016-11-22 MED ORDER — CYCLOPHOSPHAMIDE CHEMO INJECTION 1 GM
300.0000 mg/m2 | Freq: Once | INTRAMUSCULAR | Status: AC
  Administered 2016-11-22: 580 mg via INTRAVENOUS
  Filled 2016-11-22: qty 29

## 2016-11-22 MED ORDER — SODIUM CHLORIDE 0.9 % IV SOLN
40.0000 mg | Freq: Once | INTRAVENOUS | Status: AC
  Administered 2016-11-22: 40 mg via INTRAVENOUS
  Filled 2016-11-22: qty 4

## 2016-11-22 MED ORDER — BORTEZOMIB CHEMO SQ INJECTION 3.5 MG (2.5MG/ML)
1.5000 mg/m2 | Freq: Once | INTRAMUSCULAR | Status: AC
  Administered 2016-11-22: 3 mg via SUBCUTANEOUS
  Filled 2016-11-22: qty 3

## 2016-11-22 MED ORDER — PALONOSETRON HCL INJECTION 0.25 MG/5ML
0.2500 mg | Freq: Once | INTRAVENOUS | Status: AC
  Administered 2016-11-22: 0.25 mg via INTRAVENOUS

## 2016-11-22 MED ORDER — PALONOSETRON HCL INJECTION 0.25 MG/5ML
INTRAVENOUS | Status: AC
  Filled 2016-11-22: qty 5

## 2016-11-22 NOTE — Patient Instructions (Signed)
[facility] Discharge Instructions for Patients Receiving Chemotherapy  Today you received the following chemotherapy agents Cytoxan and velcade  To help prevent nausea and vomiting after your treatment, we encourage you to take your nausea medication as directed If you develop nausea and vomiting that is not controlled by your nausea medication, call the clinic.   BELOW ARE SYMPTOMS THAT SHOULD BE REPORTED IMMEDIATELY:  *FEVER GREATER THAN 100.5 F  *CHILLS WITH OR WITHOUT FEVER  NAUSEA AND VOMITING THAT IS NOT CONTROLLED WITH YOUR NAUSEA MEDICATION  *UNUSUAL SHORTNESS OF BREATH  *UNUSUAL BRUISING OR BLEEDING  TENDERNESS IN MOUTH AND THROAT WITH OR WITHOUT PRESENCE OF ULCERS  *URINARY PROBLEMS  *BOWEL PROBLEMS  UNUSUAL RASH Items with * indicate a potential emergency and should be followed up as soon as possible.  Feel free to call the clinic should you have any questions or concerns. The clinic phone number is [phone].  Please show the [place] at check-in to the Emergency Department and triage nurse.

## 2016-11-23 LAB — KAPPA/LAMBDA LIGHT CHAINS
IG KAPPA FREE LIGHT CHAIN: 7.4 mg/L (ref 3.3–19.4)
Ig Lambda Free Light Chain: 66.7 mg/L — ABNORMAL HIGH (ref 5.7–26.3)
Kappa/Lambda FluidC Ratio: 0.11 — ABNORMAL LOW (ref 0.26–1.65)

## 2016-11-24 LAB — MULTIPLE MYELOMA PANEL, SERUM
ALBUMIN/GLOB SERPL: 1.1 (ref 0.7–1.7)
ALPHA 1: 0.2 g/dL (ref 0.0–0.4)
Albumin SerPl Elph-Mcnc: 2.3 g/dL — ABNORMAL LOW (ref 2.9–4.4)
Alpha2 Glob SerPl Elph-Mcnc: 0.9 g/dL (ref 0.4–1.0)
B-Globulin SerPl Elph-Mcnc: 0.8 g/dL (ref 0.7–1.3)
Gamma Glob SerPl Elph-Mcnc: 0.3 g/dL — ABNORMAL LOW (ref 0.4–1.8)
Globulin, Total: 2.3 g/dL (ref 2.2–3.9)
IGA/IMMUNOGLOBULIN A, SERUM: 51 mg/dL — AB (ref 61–437)
IGM (IMMUNOGLOBIN M), SRM: 16 mg/dL (ref 15–143)
IgG, Qn, Serum: 308 mg/dL — ABNORMAL LOW (ref 700–1600)
M Protein SerPl Elph-Mcnc: 0.1 g/dL — ABNORMAL HIGH
Total Protein: 4.6 g/dL — ABNORMAL LOW (ref 6.0–8.5)

## 2016-11-29 ENCOUNTER — Ambulatory Visit (HOSPITAL_BASED_OUTPATIENT_CLINIC_OR_DEPARTMENT_OTHER): Payer: Medicare Other

## 2016-11-29 ENCOUNTER — Other Ambulatory Visit: Payer: Self-pay | Admitting: Internal Medicine

## 2016-11-29 ENCOUNTER — Other Ambulatory Visit (HOSPITAL_BASED_OUTPATIENT_CLINIC_OR_DEPARTMENT_OTHER): Payer: Medicare Other

## 2016-11-29 VITALS — BP 127/84 | HR 87 | Temp 98.7°F | Resp 17

## 2016-11-29 DIAGNOSIS — Z5111 Encounter for antineoplastic chemotherapy: Secondary | ICD-10-CM | POA: Diagnosis not present

## 2016-11-29 DIAGNOSIS — E8581 Light chain (AL) amyloidosis: Secondary | ICD-10-CM

## 2016-11-29 DIAGNOSIS — Z5112 Encounter for antineoplastic immunotherapy: Secondary | ICD-10-CM

## 2016-11-29 LAB — CBC & DIFF AND RETIC
BASO%: 0.7 % (ref 0.0–2.0)
BASOS ABS: 0 10*3/uL (ref 0.0–0.1)
EOS%: 4 % (ref 0.0–7.0)
Eosinophils Absolute: 0.2 10*3/uL (ref 0.0–0.5)
HEMATOCRIT: 36.3 % — AB (ref 38.4–49.9)
HGB: 12.6 g/dL — ABNORMAL LOW (ref 13.0–17.1)
Immature Retic Fract: 5.5 % (ref 3.00–10.60)
LYMPH#: 0.8 10*3/uL — AB (ref 0.9–3.3)
LYMPH%: 13.7 % — AB (ref 14.0–49.0)
MCH: 33.5 pg — ABNORMAL HIGH (ref 27.2–33.4)
MCHC: 34.7 g/dL (ref 32.0–36.0)
MCV: 96.5 fL (ref 79.3–98.0)
MONO#: 0.4 10*3/uL (ref 0.1–0.9)
MONO%: 7.2 % (ref 0.0–14.0)
NEUT#: 4.1 10*3/uL (ref 1.5–6.5)
NEUT%: 74.4 % (ref 39.0–75.0)
PLATELETS: 203 10*3/uL (ref 140–400)
RBC: 3.76 10*6/uL — AB (ref 4.20–5.82)
RDW: 13.3 % (ref 11.0–14.6)
Retic %: 2.41 % — ABNORMAL HIGH (ref 0.80–1.80)
Retic Ct Abs: 90.62 10*3/uL (ref 34.80–93.90)
WBC: 5.5 10*3/uL (ref 4.0–10.3)

## 2016-11-29 LAB — COMPREHENSIVE METABOLIC PANEL
ALT: 25 U/L (ref 0–55)
ANION GAP: 7 meq/L (ref 3–11)
AST: 38 U/L — ABNORMAL HIGH (ref 5–34)
Albumin: 2.2 g/dL — ABNORMAL LOW (ref 3.5–5.0)
Alkaline Phosphatase: 80 U/L (ref 40–150)
BILIRUBIN TOTAL: 0.58 mg/dL (ref 0.20–1.20)
BUN: 13.2 mg/dL (ref 7.0–26.0)
CALCIUM: 8.2 mg/dL — AB (ref 8.4–10.4)
CHLORIDE: 98 meq/L (ref 98–109)
CO2: 26 meq/L (ref 22–29)
Creatinine: 0.8 mg/dL (ref 0.7–1.3)
Glucose: 95 mg/dl (ref 70–140)
Potassium: 4.1 mEq/L (ref 3.5–5.1)
Sodium: 131 mEq/L — ABNORMAL LOW (ref 136–145)
Total Protein: 4.6 g/dL — ABNORMAL LOW (ref 6.4–8.3)

## 2016-11-29 MED ORDER — BORTEZOMIB CHEMO SQ INJECTION 3.5 MG (2.5MG/ML)
1.5000 mg/m2 | Freq: Once | INTRAMUSCULAR | Status: AC
  Administered 2016-11-29: 3 mg via SUBCUTANEOUS
  Filled 2016-11-29: qty 3

## 2016-11-29 MED ORDER — SODIUM CHLORIDE 0.9 % IV SOLN
Freq: Once | INTRAVENOUS | Status: AC
  Administered 2016-11-29: 12:00:00 via INTRAVENOUS

## 2016-11-29 MED ORDER — PALONOSETRON HCL INJECTION 0.25 MG/5ML
INTRAVENOUS | Status: AC
  Filled 2016-11-29: qty 5

## 2016-11-29 MED ORDER — SODIUM CHLORIDE 0.9 % IV SOLN
300.0000 mg/m2 | Freq: Once | INTRAVENOUS | Status: AC
  Administered 2016-11-29: 580 mg via INTRAVENOUS
  Filled 2016-11-29: qty 29

## 2016-11-29 MED ORDER — PALONOSETRON HCL INJECTION 0.25 MG/5ML
0.2500 mg | Freq: Once | INTRAVENOUS | Status: AC
  Administered 2016-11-29: 0.25 mg via INTRAVENOUS

## 2016-11-29 MED ORDER — DEXAMETHASONE SODIUM PHOSPHATE 100 MG/10ML IJ SOLN
40.0000 mg | Freq: Once | INTRAMUSCULAR | Status: AC
  Administered 2016-11-29: 40 mg via INTRAVENOUS
  Filled 2016-11-29: qty 4

## 2016-11-29 NOTE — Telephone Encounter (Signed)
please call Alpraz 

## 2016-11-29 NOTE — Patient Instructions (Signed)
[facility] Discharge Instructions for Patients Receiving Chemotherapy  Today you received the following chemotherapy agents Cytoxan and velcade  To help prevent nausea and vomiting after your treatment, we encourage you to take your nausea medication as directed If you develop nausea and vomiting that is not controlled by your nausea medication, call the clinic.   BELOW ARE SYMPTOMS THAT SHOULD BE REPORTED IMMEDIATELY:  *FEVER GREATER THAN 100.5 F  *CHILLS WITH OR WITHOUT FEVER  NAUSEA AND VOMITING THAT IS NOT CONTROLLED WITH YOUR NAUSEA MEDICATION  *UNUSUAL SHORTNESS OF BREATH  *UNUSUAL BRUISING OR BLEEDING  TENDERNESS IN MOUTH AND THROAT WITH OR WITHOUT PRESENCE OF ULCERS  *URINARY PROBLEMS  *BOWEL PROBLEMS  UNUSUAL RASH Items with * indicate a potential emergency and should be followed up as soon as possible.  Feel free to call the clinic should you have any questions or concerns. The clinic phone number is [phone].  Please show the [place] at check-in to the Emergency Department and triage nurse.

## 2016-12-01 ENCOUNTER — Ambulatory Visit (INDEPENDENT_AMBULATORY_CARE_PROVIDER_SITE_OTHER): Payer: Medicare Other | Admitting: *Deleted

## 2016-12-01 DIAGNOSIS — Z23 Encounter for immunization: Secondary | ICD-10-CM

## 2016-12-06 ENCOUNTER — Other Ambulatory Visit (HOSPITAL_BASED_OUTPATIENT_CLINIC_OR_DEPARTMENT_OTHER): Payer: Medicare Other

## 2016-12-06 ENCOUNTER — Ambulatory Visit (HOSPITAL_BASED_OUTPATIENT_CLINIC_OR_DEPARTMENT_OTHER): Payer: Medicare Other

## 2016-12-06 VITALS — BP 118/77 | HR 87 | Temp 98.7°F | Resp 18

## 2016-12-06 DIAGNOSIS — Z5111 Encounter for antineoplastic chemotherapy: Secondary | ICD-10-CM

## 2016-12-06 DIAGNOSIS — Z5112 Encounter for antineoplastic immunotherapy: Secondary | ICD-10-CM | POA: Diagnosis not present

## 2016-12-06 DIAGNOSIS — E8581 Light chain (AL) amyloidosis: Secondary | ICD-10-CM

## 2016-12-06 LAB — CBC & DIFF AND RETIC
BASO%: 0.9 % (ref 0.0–2.0)
Basophils Absolute: 0 10*3/uL (ref 0.0–0.1)
EOS%: 4.5 % (ref 0.0–7.0)
Eosinophils Absolute: 0.2 10*3/uL (ref 0.0–0.5)
HCT: 35.8 % — ABNORMAL LOW (ref 38.4–49.9)
HGB: 12.4 g/dL — ABNORMAL LOW (ref 13.0–17.1)
Immature Retic Fract: 5.7 % (ref 3.00–10.60)
LYMPH%: 13.6 % — AB (ref 14.0–49.0)
MCH: 33.2 pg (ref 27.2–33.4)
MCHC: 34.6 g/dL (ref 32.0–36.0)
MCV: 96 fL (ref 79.3–98.0)
MONO#: 0.7 10*3/uL (ref 0.1–0.9)
MONO%: 14.8 % — AB (ref 0.0–14.0)
NEUT#: 2.9 10*3/uL (ref 1.5–6.5)
NEUT%: 66.2 % (ref 39.0–75.0)
PLATELETS: 246 10*3/uL (ref 140–400)
RBC: 3.73 10*6/uL — AB (ref 4.20–5.82)
RDW: 13.4 % (ref 11.0–14.6)
RETIC CT ABS: 79.08 10*3/uL (ref 34.80–93.90)
Retic %: 2.12 % — ABNORMAL HIGH (ref 0.80–1.80)
WBC: 4.4 10*3/uL (ref 4.0–10.3)
lymph#: 0.6 10*3/uL — ABNORMAL LOW (ref 0.9–3.3)

## 2016-12-06 LAB — COMPREHENSIVE METABOLIC PANEL
ALT: 25 U/L (ref 0–55)
ANION GAP: 8 meq/L (ref 3–11)
AST: 42 U/L — ABNORMAL HIGH (ref 5–34)
Albumin: 2.2 g/dL — ABNORMAL LOW (ref 3.5–5.0)
Alkaline Phosphatase: 87 U/L (ref 40–150)
BUN: 13.4 mg/dL (ref 7.0–26.0)
CHLORIDE: 98 meq/L (ref 98–109)
CO2: 26 meq/L (ref 22–29)
Calcium: 8.4 mg/dL (ref 8.4–10.4)
Creatinine: 0.8 mg/dL (ref 0.7–1.3)
Glucose: 111 mg/dl (ref 70–140)
POTASSIUM: 3.7 meq/L (ref 3.5–5.1)
Sodium: 132 mEq/L — ABNORMAL LOW (ref 136–145)
Total Bilirubin: 0.48 mg/dL (ref 0.20–1.20)
Total Protein: 4.8 g/dL — ABNORMAL LOW (ref 6.4–8.3)

## 2016-12-06 MED ORDER — SODIUM CHLORIDE 0.9 % IV SOLN
40.0000 mg | Freq: Once | INTRAVENOUS | Status: AC
  Administered 2016-12-06: 40 mg via INTRAVENOUS
  Filled 2016-12-06: qty 4

## 2016-12-06 MED ORDER — BORTEZOMIB CHEMO SQ INJECTION 3.5 MG (2.5MG/ML)
1.5000 mg/m2 | Freq: Once | INTRAMUSCULAR | Status: AC
  Administered 2016-12-06: 3 mg via SUBCUTANEOUS
  Filled 2016-12-06: qty 3

## 2016-12-06 MED ORDER — SODIUM CHLORIDE 0.9 % IV SOLN
Freq: Once | INTRAVENOUS | Status: AC
  Administered 2016-12-06: 11:00:00 via INTRAVENOUS

## 2016-12-06 MED ORDER — PALONOSETRON HCL INJECTION 0.25 MG/5ML
0.2500 mg | Freq: Once | INTRAVENOUS | Status: AC
  Administered 2016-12-06: 0.25 mg via INTRAVENOUS

## 2016-12-06 MED ORDER — SODIUM CHLORIDE 0.9 % IV SOLN
300.0000 mg/m2 | Freq: Once | INTRAVENOUS | Status: AC
  Administered 2016-12-06: 580 mg via INTRAVENOUS
  Filled 2016-12-06: qty 29

## 2016-12-06 MED ORDER — PALONOSETRON HCL INJECTION 0.25 MG/5ML
INTRAVENOUS | Status: AC
  Filled 2016-12-06: qty 5

## 2016-12-06 NOTE — Patient Instructions (Signed)
[facility] Discharge Instructions for Patients Receiving Chemotherapy  Today you received the following chemotherapy agents Cytoxan and velcade  To help prevent nausea and vomiting after your treatment, we encourage you to take your nausea medication as directed If you develop nausea and vomiting that is not controlled by your nausea medication, call the clinic.   BELOW ARE SYMPTOMS THAT SHOULD BE REPORTED IMMEDIATELY:  *FEVER GREATER THAN 100.5 F  *CHILLS WITH OR WITHOUT FEVER  NAUSEA AND VOMITING THAT IS NOT CONTROLLED WITH YOUR NAUSEA MEDICATION  *UNUSUAL SHORTNESS OF BREATH  *UNUSUAL BRUISING OR BLEEDING  TENDERNESS IN MOUTH AND THROAT WITH OR WITHOUT PRESENCE OF ULCERS  *URINARY PROBLEMS  *BOWEL PROBLEMS  UNUSUAL RASH Items with * indicate a potential emergency and should be followed up as soon as possible.  Feel free to call the clinic should you have any questions or concerns. The clinic phone number is [phone].  Please show the [place] at check-in to the Emergency Department and triage nurse.

## 2016-12-13 ENCOUNTER — Ambulatory Visit (HOSPITAL_BASED_OUTPATIENT_CLINIC_OR_DEPARTMENT_OTHER): Payer: Medicare Other | Admitting: Hematology

## 2016-12-13 ENCOUNTER — Ambulatory Visit (HOSPITAL_BASED_OUTPATIENT_CLINIC_OR_DEPARTMENT_OTHER): Payer: Medicare Other

## 2016-12-13 ENCOUNTER — Encounter: Payer: Self-pay | Admitting: Hematology

## 2016-12-13 ENCOUNTER — Other Ambulatory Visit (HOSPITAL_BASED_OUTPATIENT_CLINIC_OR_DEPARTMENT_OTHER): Payer: Medicare Other

## 2016-12-13 VITALS — BP 128/84 | HR 80 | Temp 97.6°F | Resp 18 | Ht 68.0 in | Wt 180.2 lb

## 2016-12-13 DIAGNOSIS — E8581 Light chain (AL) amyloidosis: Secondary | ICD-10-CM

## 2016-12-13 DIAGNOSIS — E8809 Other disorders of plasma-protein metabolism, not elsewhere classified: Secondary | ICD-10-CM | POA: Diagnosis not present

## 2016-12-13 DIAGNOSIS — Z5111 Encounter for antineoplastic chemotherapy: Secondary | ICD-10-CM | POA: Diagnosis not present

## 2016-12-13 DIAGNOSIS — M7989 Other specified soft tissue disorders: Secondary | ICD-10-CM | POA: Diagnosis not present

## 2016-12-13 DIAGNOSIS — Z5112 Encounter for antineoplastic immunotherapy: Secondary | ICD-10-CM | POA: Diagnosis not present

## 2016-12-13 DIAGNOSIS — N049 Nephrotic syndrome with unspecified morphologic changes: Secondary | ICD-10-CM | POA: Diagnosis not present

## 2016-12-13 LAB — COMPREHENSIVE METABOLIC PANEL
ALT: 32 U/L (ref 0–55)
ANION GAP: 6 meq/L (ref 3–11)
AST: 48 U/L — AB (ref 5–34)
Albumin: 2.3 g/dL — ABNORMAL LOW (ref 3.5–5.0)
Alkaline Phosphatase: 77 U/L (ref 40–150)
BILIRUBIN TOTAL: 0.45 mg/dL (ref 0.20–1.20)
BUN: 11.1 mg/dL (ref 7.0–26.0)
CALCIUM: 8.6 mg/dL (ref 8.4–10.4)
CHLORIDE: 100 meq/L (ref 98–109)
CO2: 28 mEq/L (ref 22–29)
CREATININE: 0.8 mg/dL (ref 0.7–1.3)
EGFR: >60 mL/min/{1.73_m2} (ref >60)
Glucose: 70 mg/dl (ref 70–140)
Potassium: 4.1 mEq/L (ref 3.5–5.1)
Sodium: 134 mEq/L — ABNORMAL LOW (ref 136–145)
TOTAL PROTEIN: 4.8 g/dL — AB (ref 6.4–8.3)

## 2016-12-13 LAB — CBC & DIFF AND RETIC
BASO%: 0.4 % (ref 0.0–2.0)
Basophils Absolute: 0 10*3/uL (ref 0.0–0.1)
EOS ABS: 0.2 10*3/uL (ref 0.0–0.5)
EOS%: 4.6 % (ref 0.0–7.0)
HEMATOCRIT: 35.6 % — AB (ref 38.4–49.9)
HEMOGLOBIN: 12.3 g/dL — AB (ref 13.0–17.1)
IMMATURE RETIC FRACT: 2.2 % — AB (ref 3.00–10.60)
LYMPH#: 0.7 10*3/uL — AB (ref 0.9–3.3)
LYMPH%: 14.2 % (ref 14.0–49.0)
MCH: 33.5 pg — ABNORMAL HIGH (ref 27.2–33.4)
MCHC: 34.6 g/dL (ref 32.0–36.0)
MCV: 97 fL (ref 79.3–98.0)
MONO#: 0.7 10*3/uL (ref 0.1–0.9)
MONO%: 14.4 % — ABNORMAL HIGH (ref 0.0–14.0)
NEUT#: 3.2 10*3/uL (ref 1.5–6.5)
NEUT%: 66.4 % (ref 39.0–75.0)
PLATELETS: 239 10*3/uL (ref 140–400)
RBC: 3.67 10*6/uL — ABNORMAL LOW (ref 4.20–5.82)
RDW: 13.8 % (ref 11.0–14.6)
RETIC %: 2.19 % — AB (ref 0.80–1.80)
RETIC CT ABS: 80.37 10*3/uL (ref 34.80–93.90)
WBC: 4.8 10*3/uL (ref 4.0–10.3)

## 2016-12-13 MED ORDER — SODIUM CHLORIDE 0.9 % IV SOLN
400.0000 mg/m2 | Freq: Once | INTRAVENOUS | Status: AC
  Administered 2016-12-13: 780 mg via INTRAVENOUS
  Filled 2016-12-13: qty 39

## 2016-12-13 MED ORDER — PALONOSETRON HCL INJECTION 0.25 MG/5ML
0.2500 mg | Freq: Once | INTRAVENOUS | Status: AC
  Administered 2016-12-13: 0.25 mg via INTRAVENOUS

## 2016-12-13 MED ORDER — BORTEZOMIB CHEMO SQ INJECTION 3.5 MG (2.5MG/ML)
1.5000 mg/m2 | Freq: Once | INTRAMUSCULAR | Status: AC
Start: 2016-12-13 — End: 2016-12-13
  Administered 2016-12-13: 3 mg via SUBCUTANEOUS
  Filled 2016-12-13: qty 3

## 2016-12-13 MED ORDER — SODIUM CHLORIDE 0.9 % IV SOLN
20.0000 mg | Freq: Once | INTRAVENOUS | Status: AC
  Administered 2016-12-13: 20 mg via INTRAVENOUS
  Filled 2016-12-13: qty 2

## 2016-12-13 MED ORDER — SODIUM CHLORIDE 0.9 % IV SOLN
Freq: Once | INTRAVENOUS | Status: AC
  Administered 2016-12-13: 13:00:00 via INTRAVENOUS

## 2016-12-13 MED ORDER — PALONOSETRON HCL INJECTION 0.25 MG/5ML
INTRAVENOUS | Status: AC
  Filled 2016-12-13: qty 5

## 2016-12-13 NOTE — Patient Instructions (Signed)
[facility] Discharge Instructions for Patients Receiving Chemotherapy  Today you received the following chemotherapy agents: Velcade and Cytoxan   To help prevent nausea and vomiting after your treatment, we encourage you to take your nausea medication as directed.    If you develop nausea and vomiting that is not controlled by your nausea medication, call the clinic.   BELOW ARE SYMPTOMS THAT SHOULD BE REPORTED IMMEDIATELY:  *FEVER GREATER THAN 100.5 F  *CHILLS WITH OR WITHOUT FEVER  NAUSEA AND VOMITING THAT IS NOT CONTROLLED WITH YOUR NAUSEA MEDICATION  *UNUSUAL SHORTNESS OF BREATH  *UNUSUAL BRUISING OR BLEEDING  TENDERNESS IN MOUTH AND THROAT WITH OR WITHOUT PRESENCE OF ULCERS  *URINARY PROBLEMS  *BOWEL PROBLEMS  UNUSUAL RASH Items with * indicate a potential emergency and should be followed up as soon as possible.  Feel free to call the clinic should you have any questions or concerns. The clinic phone number is [phone].  Please show the CHEMO ALERT CARD at check-in to the Emergency Department and triage nurse.   

## 2016-12-13 NOTE — Progress Notes (Signed)
[name]    HEMATOLOGY/ONCOLOGY FOLLOW UP NOTE  Date of Service: 12/13/2016  Patient Care Team: Unk Pinto, MD as PCP - General (Internal Medicine) Inda Castle, MD as Consulting Physician (Gastroenterology) Rexene Agent, MD as Attending Physician (Nephrology) Pearson Grippe MD -nephrology   CHIEF COMPLAINTS/PURPOSE OF CONSULTATION:  Renal AL Amyloidosis  HISTORY OF PRESENTING ILLNESS:  Gregory Galvan is a wonderful 76 y.o. male who has been referred to [place] by Dr .Unk Pinto, MD /Dr Pearson Grippe MD for evaluation and management of newly diagnosed Renal AL Amyloidosis.  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   He recently developed lower extremity swelling and increasing proteinuria and was referred to nephrology and was seen by Dr. Pearson Grippe at [facility].   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  SPEP showed a small M spike of 0.2 g/dL IFE showing IgG lambda monoclonal paraproteinemia. Increase in serum lambda light chain with a decreased kappa/Lambda light chain ratio.  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 [place] red stain positive.  Patient was referred to [place] for further evaluation and management of his newly diagnosed AL Amyloidosis .  Patient notes his leg swelling is better with diuretics.  Notes no fever no chills no night sweats no unexpected weight loss. No focal bone pains. Labs have not shown any overt anemia or hypercalcemia. His creatinine is relatively normal at 1. Hypoalbuminemia with albumin level of 2.7 g/dL.  INTERVAL HISTORY  Patient presents to the clinic today for follow up of his renal AL amyloidosis and for his eighth weekly cycle  of treatment. Since we last saw him, he reports that he has been doing very well. He has returned to work and he has remained busy. He reports that since this he has had some mild lower back pain and he has remained tired, but he states that neither of these have been prohibitive. His M spike and SFLC are trending downwards. He does reports that his legs have been increasingly swelling since we last saw him. He has been placed on a diuretic by his Nephrologist, however, this has not been helping with his leg swelling.   No symptoms of neuropathy at this time.  MEDICAL HISTORY:  Past Medical History:  Diagnosis Date  . BPH (benign prostatic hyperplasia)   . Hyperlipidemia   . Hypertension   . Hypogonadism, male   . Pre-diabetes   . Prostatitis   . 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 No surgical procedure performed. History of hypertension for 30 years on ACE inhibitor, beta blocker and Lasix Hypothyroidism   SURGICAL HISTORY: Past Surgical History:  Procedure Laterality Date  . APPENDECTOMY      SOCIAL HISTORY: Social History   Social History  . Marital status: Divorced    Spouse name: N/A  . Number of children: N/A  . Years of education: N/A   Occupational History  . Not on file.   Social History Main Topics  . Smoking status: Former Smoker    Quit date: 12/22/2013  . Smokeless tobacco: Never Used  . Alcohol use 16.8 oz/week    28 Standard drinks or equivalent per week  Comment: drinks wine  . Drug use: No  . Sexual activity: Not on file   Other Topics Concern  . Not on file   Social History Narrative  . No narrative on file    FAMILY HISTORY: Family History  Problem Relation Age of Onset  . Cancer Mother        breast  . Alzheimer's disease Mother   . Hypertension Father   . Heart disease Father   . Lymphoma Father   . Hypertension Brother   . Heart disease Brother   . Heart disease  Brother   . Hypertension Brother   . Diabetes Brother     ALLERGIES:  is allergic to ppd [tuberculin purified protein derivative]; sulfonamide derivatives; and penicillins.  MEDICATIONS:  Current Outpatient Prescriptions  Medication Sig Dispense Refill  . acyclovir (ZOVIRAX) 400 MG tablet Take 1 tablet (400 mg total) by mouth 2 (two) times daily. 60 tablet 3  . ALPRAZolam (XANAX) 0.5 MG tablet take 1/2 to 1 tablet by mouth at bedtime if needed 90 tablet 1  . Cholecalciferol (VITAMIN D) 2000 units tablet Take 8,000 Units by mouth daily.    [name] ezetimibe (ZETIA) 10 MG tablet Take 1 tablet daily for Cholesterol 90 tablet 1  . furosemide (LASIX) 40 MG tablet 1-2 pills a day for swelling (Patient taking differently: Take 80 mg by mouth daily. ) 60 tablet 11  . latanoprost (XALATAN) 0.005 % ophthalmic solution Place 1 drop into both eyes at bedtime.     [name] levothyroxine (SYNTHROID, LEVOTHROID) 175 MCG tablet take 1 and 1/2 tablets by mouth once daily or as directed 135 tablet 1  . lisinopril (PRINIVIL,ZESTRIL) 20 MG tablet Take 20 mg by mouth daily.  0  . Magnesium 100 MG TABS Take 300 mg by mouth daily.    . milk thistle 175 MG tablet Take 175 mg by mouth daily.    . Multiple Vitamins-Minerals (MULTIVITAMIN WITH MINERALS) tablet Take 1 tablet by mouth daily.    . ondansetron (ZOFRAN) 8 MG tablet Take 1 tablet (8 mg total) by mouth 2 (two) times daily as needed for refractory nausea / vomiting. Start on day 3 after chemo. 30 tablet 1  . PARoxetine (PAXIL) 20 MG tablet Take 10 mg by mouth daily.    . prochlorperazine (COMPAZINE) 10 MG tablet Take 1 tablet (10 mg total) by mouth every 6 (six) hours as needed (Nausea or vomiting). 30 tablet 1  . Turmeric 500 MG CAPS Take 1,500 mg elemental calcium/kg/hr by mouth daily.     No current facility-administered medications for this visit.     REVIEW OF SYSTEMS:    10 Point review of Systems was done is negative except as noted above.  PHYSICAL  EXAMINATION: ECOG PERFORMANCE STATUS: 1 - Symptomatic but completely ambulatory  . Vitals:   12/13/16 1142  BP: 128/84  Pulse: 80  Resp: 18  Temp: 97.6 F (36.4 C)   Filed Weights   12/13/16 1142  Weight: 180 lb 3.2 oz (81.7 kg)   .Body mass index is 27.4 kg/m.  GENERAL:alert, in no acute distress and comfortable SKIN: no acute rashes, no significant lesions EYES: conjunctiva are pink and non-injected, sclera anicteric OROPHARYNX: MMM, no exudates, no oropharyngeal erythema or ulceration NECK: supple, no JVD LYMPH:  no palpable lymphadenopathy in the cervical, axillary or inguinal regions LUNGS: clear to auscultation b/l with normal respiratory effort HEART: regular rate & rhythm ABDOMEN:  normoactive bowel sounds , non tender, not distended. No palpable  hepatosplenomegaly Extremity: 2+ b/l pitting pedal edema PSYCH: alert & oriented x 3 with fluent speech NEURO: no focal motor/sensory deficits  LABORATORY DATA:  I have reviewed the data as listed  . CBC Latest Ref Rng & Units 12/13/2016 12/06/2016 11/29/2016  WBC 4.0 - 10.3 10e3/uL 4.8 4.4 5.5  Hemoglobin 13.0 - 17.1 g/dL 12.3(L) 12.4(L) 12.6(L)  Hematocrit 38.4 - 49.9 % 35.6(L) 35.8(L) 36.3(L)  Platelets 140 - 400 10e3/uL 239 246 203    . CMP Latest Ref Rng & Units 12/13/2016 12/06/2016 11/29/2016  Glucose 70 - 140 mg/dl 70 111 95  BUN 7.0 - 26.0 mg/dL 11.1 13.4 13.2  Creatinine 0.7 - 1.3 mg/dL 0.8 0.8 0.8  Sodium 136 - 145 mEq/L 134(L) 132(L) 131(L)  Potassium 3.5 - 5.1 mEq/L 4.1 3.7 4.1  Chloride 98 - 110 mmol/L - - -  CO2 22 - 29 mEq/L _0 Calcium 8.4 - 10.4 mg/dL 8.6 8.4 8.2(L)  Total Protein 6.4 - 8.3 g/dL 4.8(L) 4.8(L) 4.6(L)  Total Bilirubin 0.20 - 1.20 mg/dL 0.45 0.48 0.58  Alkaline Phos 40 - 150 U/L 77 87 80  AST 5 - 34 U/L 48(H) 42(H) 38(H)  ALT 0 - 55 U/L 32 25 25   Component     Latest Ref Rng & Units 10/25/2016 11/22/2016  IgG (Immunoglobin G), Serum     700 - 1,600 mg/dL 496 (L) 308  (L)  IgA/Immunoglobulin A, Serum     61 - 437 mg/dL 76 51 (L)  IgM, Qn, Serum     15 - 143 mg/dL 22 16  Total Protein     6.0 - 8.5 g/dL 4.9 (L) 4.6 (L)  Albumin SerPl Elph-Mcnc     2.9 - 4.4 g/dL 2.6 (L) 2.3 (L)  Alpha 1     0.0 - 0.4 g/dL 0.2 0.2  Alpha2 Glob SerPl Elph-Mcnc     0.4 - 1.0 g/dL 0.9 0.9  B-Globulin SerPl Elph-Mcnc     0.7 - 1.3 g/dL 0.7 0.8  Gamma Glob SerPl Elph-Mcnc     0.4 - 1.8 g/dL 0.4 0.3 (L)  M Protein SerPl Elph-Mcnc     Not Observed g/dL 0.2 (H) 0.1 (H)  Globulin, Total     2.2 - 3.9 g/dL 2.3 2.3  Albumin/Glob SerPl     0.7 - 1.7 1.2 1.1  IFE 1      Comment Comment  Please Note (HCV):      Comment Comment  Ig Kappa Free Light Chain     3.3 - 19.4 mg/L 9.5 7.4  Ig Lambda Free Light Chain     5.7 - 26.3 mg/L 120.9 (H) 66.7 (H)  Kappa/Lambda FluidC Ratio     0.26 - 1.65 0.08 (L) 0.11 (L)        Component     Latest Ref Rng & Units 08/22/2016 10/07/2016  C3 Complement     82 - 185 mg/dL 113   C4 Complement     15 - 53 mg/dL 32   Anit Nuclear Antibody(ANA)     NEGATIVE NEG   RPR     NON REAC NON REAC   Angiotensin-Converting Enzyme     9 - 67 U/L 17   ds DNA Ab     IU/mL 1   ANCA SCREEN      Negative   Hep C Virus Ab     0.0 - 0.9 s/co ratio  0.1   Component     Latest Ref Rng &  Units 10/07/2016  Hep C Virus Ab     0.0 - 0.9 s/co ratio 0.1  Hepatitis B Surface Ag     Negative Negative  HIV Screen 4th Generation wRfx     Non Reactive Non Reactive                                   *[place]*                  *[employer] [employer].                        [place], [place] [address]                            [phone]  ------------------------------------------------------------------- Transthoracic Echocardiography  Patient:    Dorin, [place]bury MR #:       [id number] Study Date: 10/12/2016 Gender:     M Age:        60 Height:     172.7 cm Weight:     77.7 kg BSA:         1.94 m^2 Pt. Status: Room:   SONOGRAPHER  Dustin Flock, RCS  PERFORMING   [facility], Outpatient  ATTENDING    [place], [place], [place] Kishore  REFERRING    [place], [place] Kishore  cc:  ------------------------------------------------------------------- LV EF: 65% -   70%  ------------------------------------------------------------------- Indications:      Pre-op evaluation V728.1.  ------------------------------------------------------------------- History:   PMH:  Renal Amyloidosis, Hypertension, pre-chemo.  ------------------------------------------------------------------- Study Conclusions  - Left ventricle: The cavity size was normal. Wall thickness was   increased in a pattern of moderate LVH. Systolic function was   vigorous. The estimated ejection fraction was in the range of 65%   to 70%. Wall motion was normal; there were no regional wall   motion abnormalities. Doppler parameters are consistent with   abnormal left ventricular relaxation (grade 1 diastolic   dysfunction). - Mitral valve: There was trivial regurgitation. - Left atrium: The atrium was mildly dilated.    RADIOGRAPHIC STUDIES:  I have personally reviewed the radiological images as listed and agreed with the findings in the report. No results found.  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  -No hypercalcemia, normal creatinine level, no anemia. -No evidence of bone lesions on whole body skeletal survey. BM Bx- no evidence of myeloma  Echo shows normal ejection fraction with grade 1 diastolic dysfunction.  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  Hepatitis C, HIV and hepatitis B negative. Other workup as per nephrologist negative. Patient  follows with Dr. Pearson Grippe  [facility]. PLAN -labs stable today. His recent SPEP and SFLC show response to treatment with improvement in M spike from 0.2 to 0.1g/dl and Seru free Lambda light chains from 120 to 66.7. -rpt 24h UPEP this week -- ordered - no prohibtiive toxicities from treatment thus far -I will slightly increase his cyclophosphamide dosage today from 300  to 485m/m2 -dexamethasone reduced to 255mweekly in light of significant leg swelling. -continue weekly Velcade.  #3 Leg swelling - due to hypoalbuminemia from his nephrotic syndrome + Steroids Plan -continue diuretics as per nephrologist. Currently taking lasix 8072mo daily. - we will lower his steroid dose (Dexamethasone down from 71m52mekly to 20mg84mkly) -. I also urged him to only consume 48-64oz daily to reduce that amount of fluid he retains.  -minimize salt intake -We will also prescribe him prescription compression socks today.  -elevated legs as much as possible.  No orders of the defined types were placed in this encounter.  Patient is appropriate for Chemotherapy today and continue per orders. RTC with Dr Kale Irene Limbo weeks Continue weekly labs 24hUPEP today  All of the patients questions were answered with apparent satisfaction. The patient knows to call the clinic with any problems, questions or concerns.  I spent 20 minutes counseling the patient face to face. The total time spent in the appointment was 25 minutes and more than 50% was on counseling and direct patient cares.  This document serves as a record of services personally performed by GautaSullivan Lone It was created on his behalf by AndreTobe Sosrained medical scribe. The creation of this record is based on the scribe's personal observations and the provider's statements to them. This document has been checked and approved by the attending provider.  GautaSullivan LoneS AASioux CenterVMS SCH CTripoint Medical CenterHGastroenterology Specialists Inctology/Oncology Physician [facility] Hospitalfice):       [phone] cell):  [phone]           [phone] [id number]/2018 11:55 AM

## 2016-12-14 ENCOUNTER — Telehealth: Payer: Self-pay | Admitting: Hematology

## 2016-12-14 NOTE — Telephone Encounter (Signed)
Scheduled appt per 10/30 los = patient is aware of appts added - my chart active.

## 2016-12-19 LAB — UPEP/UIFE/LIGHT CHAINS/TP, 24-HR UR
% BETA, Urine: 7.2 %
ALBUMIN, U: 79.9 %
ALPHA 1 URINE: 8.2 %
ALPHA-2-GLOBULIN, U: 3.3 %
FREE LAMBDA LT CHAINS, UR: 10.4 mg/L — AB (ref 0.24–6.66)
Free Kappa Lt Chains,Ur: 7.57 mg/L (ref 1.35–24.19)
GAMMA GLOBULIN URINE: 1.3 %
KAPPA/LAMBDA RATIO, U: 0.73 — AB (ref 2.04–10.37)
PROTEIN UR: 101.9 mg/dL
Prot,24hr calculated: 2955 mg/24 hr — ABNORMAL HIGH (ref 30–150)

## 2016-12-20 ENCOUNTER — Ambulatory Visit (HOSPITAL_BASED_OUTPATIENT_CLINIC_OR_DEPARTMENT_OTHER): Payer: Medicare Other

## 2016-12-20 ENCOUNTER — Other Ambulatory Visit (HOSPITAL_BASED_OUTPATIENT_CLINIC_OR_DEPARTMENT_OTHER): Payer: Medicare Other

## 2016-12-20 VITALS — BP 126/84 | HR 95 | Temp 98.7°F

## 2016-12-20 DIAGNOSIS — E559 Vitamin D deficiency, unspecified: Secondary | ICD-10-CM

## 2016-12-20 DIAGNOSIS — Z6824 Body mass index (BMI) 24.0-24.9, adult: Secondary | ICD-10-CM

## 2016-12-20 DIAGNOSIS — H409 Unspecified glaucoma: Secondary | ICD-10-CM

## 2016-12-20 DIAGNOSIS — E8581 Light chain (AL) amyloidosis: Secondary | ICD-10-CM | POA: Diagnosis not present

## 2016-12-20 DIAGNOSIS — Z5111 Encounter for antineoplastic chemotherapy: Secondary | ICD-10-CM | POA: Diagnosis not present

## 2016-12-20 DIAGNOSIS — M790 Rheumatism, unspecified: Secondary | ICD-10-CM

## 2016-12-20 DIAGNOSIS — Z5112 Encounter for antineoplastic immunotherapy: Secondary | ICD-10-CM | POA: Diagnosis not present

## 2016-12-20 DIAGNOSIS — E782 Mixed hyperlipidemia: Secondary | ICD-10-CM

## 2016-12-20 DIAGNOSIS — M797 Fibromyalgia: Secondary | ICD-10-CM

## 2016-12-20 DIAGNOSIS — F325 Major depressive disorder, single episode, in full remission: Secondary | ICD-10-CM

## 2016-12-20 DIAGNOSIS — Z79899 Other long term (current) drug therapy: Secondary | ICD-10-CM

## 2016-12-20 DIAGNOSIS — I10 Essential (primary) hypertension: Secondary | ICD-10-CM

## 2016-12-20 DIAGNOSIS — R7309 Other abnormal glucose: Secondary | ICD-10-CM

## 2016-12-20 DIAGNOSIS — E039 Hypothyroidism, unspecified: Secondary | ICD-10-CM

## 2016-12-20 LAB — CBC & DIFF AND RETIC
BASO%: 1.8 % (ref 0.0–2.0)
Basophils Absolute: 0.1 10*3/uL (ref 0.0–0.1)
EOS ABS: 0.4 10*3/uL (ref 0.0–0.5)
EOS%: 9.5 % — ABNORMAL HIGH (ref 0.0–7.0)
HCT: 36.6 % — ABNORMAL LOW (ref 38.4–49.9)
HEMOGLOBIN: 12.6 g/dL — AB (ref 13.0–17.1)
IMMATURE RETIC FRACT: 3.6 % (ref 3.00–10.60)
LYMPH%: 13.4 % — AB (ref 14.0–49.0)
MCH: 33.8 pg — AB (ref 27.2–33.4)
MCHC: 34.4 g/dL (ref 32.0–36.0)
MCV: 98.1 fL — AB (ref 79.3–98.0)
MONO#: 0.6 10*3/uL (ref 0.1–0.9)
MONO%: 13 % (ref 0.0–14.0)
NEUT%: 62.3 % (ref 39.0–75.0)
NEUTROS ABS: 2.8 10*3/uL (ref 1.5–6.5)
PLATELETS: 175 10*3/uL (ref 140–400)
RBC: 3.73 10*6/uL — ABNORMAL LOW (ref 4.20–5.82)
RDW: 14.4 % (ref 11.0–14.6)
Retic %: 2.15 % — ABNORMAL HIGH (ref 0.80–1.80)
Retic Ct Abs: 80.2 10*3/uL (ref 34.80–93.90)
WBC: 4.5 10*3/uL (ref 4.0–10.3)
lymph#: 0.6 10*3/uL — ABNORMAL LOW (ref 0.9–3.3)

## 2016-12-20 LAB — COMPREHENSIVE METABOLIC PANEL
ALBUMIN: 2.3 g/dL — AB (ref 3.5–5.0)
ALK PHOS: 84 U/L (ref 40–150)
ALT: 31 U/L (ref 0–55)
AST: 53 U/L — AB (ref 5–34)
Anion Gap: 7 mEq/L (ref 3–11)
BUN: 15 mg/dL (ref 7.0–26.0)
CALCIUM: 8 mg/dL — AB (ref 8.4–10.4)
CO2: 28 mEq/L (ref 22–29)
CREATININE: 0.9 mg/dL (ref 0.7–1.3)
Chloride: 100 mEq/L (ref 98–109)
EGFR: >60 mL/min/{1.73_m2} (ref >60)
GLUCOSE: 85 mg/dL (ref 70–140)
Potassium: 3.5 mEq/L (ref 3.5–5.1)
SODIUM: 135 meq/L — AB (ref 136–145)
Total Bilirubin: 0.55 mg/dL (ref 0.20–1.20)
Total Protein: 4.5 g/dL — ABNORMAL LOW (ref 6.4–8.3)

## 2016-12-20 MED ORDER — BORTEZOMIB CHEMO SQ INJECTION 3.5 MG (2.5MG/ML)
1.5000 mg/m2 | Freq: Once | INTRAMUSCULAR | Status: AC
  Administered 2016-12-20: 3 mg via SUBCUTANEOUS
  Filled 2016-12-20: qty 1.2

## 2016-12-20 MED ORDER — SODIUM CHLORIDE 0.9 % IV SOLN
20.0000 mg | Freq: Once | INTRAVENOUS | Status: AC
  Administered 2016-12-20: 20 mg via INTRAVENOUS
  Filled 2016-12-20: qty 2

## 2016-12-20 MED ORDER — SODIUM CHLORIDE 0.9 % IV SOLN
400.0000 mg/m2 | Freq: Once | INTRAVENOUS | Status: AC
  Administered 2016-12-20: 780 mg via INTRAVENOUS
  Filled 2016-12-20: qty 39

## 2016-12-20 MED ORDER — PALONOSETRON HCL INJECTION 0.25 MG/5ML
INTRAVENOUS | Status: AC
Start: 2016-12-20 — End: 2016-12-20
  Filled 2016-12-20: qty 5

## 2016-12-20 MED ORDER — SODIUM CHLORIDE 0.9 % IV SOLN
Freq: Once | INTRAVENOUS | Status: AC
  Administered 2016-12-20: 14:00:00 via INTRAVENOUS

## 2016-12-20 MED ORDER — PALONOSETRON HCL INJECTION 0.25 MG/5ML
0.2500 mg | Freq: Once | INTRAVENOUS | Status: AC
  Administered 2016-12-20: 0.25 mg via INTRAVENOUS

## 2016-12-20 NOTE — Patient Instructions (Addendum)
[facility] Discharge Instructions for Patients Receiving Chemotherapy  Today you received the following chemotherapy agents Cytoxan,velcade To help prevent nausea and vomiting after your treatment, we encourage you to take your nausea medication as presc cribed.  If you develop nausea and vomiting that is not controlled by your nausea medication, call the clinic.   BELOW ARE SYMPTOMS THAT SHOULD BE REPORTED IMMEDIATELY:  *FEVER GREATER THAN 100.5 F  *CHILLS WITH OR WITHOUT FEVER  NAUSEA AND VOMITING THAT IS NOT CONTROLLED WITH YOUR NAUSEA MEDICATION  *UNUSUAL SHORTNESS OF BREATH  *UNUSUAL BRUISING OR BLEEDING  TENDERNESS IN MOUTH AND THROAT WITH OR WITHOUT PRESENCE OF ULCERS  *URINARY PROBLEMS  *BOWEL PROBLEMS  UNUSUAL RASH Items with * indicate a potential emergency and should be followed up as soon as possible.  Feel free to call the clinic should you have any questions or concerns. The clinic phone number is [phone].  Please show the [place] at check-in to the Emergency Department and triage nurse.  For the rash and itching  take  PEPCID 20 mg twice a day and ZYRTEC one tablet daily. You can get these at your local pharmacy.Stop  taking any over the counter supplements. Do not use hot water,use lukewarm water for bathing.

## 2016-12-20 NOTE — Progress Notes (Signed)
Pt reports his skin has been  Itching for about a month. He reported he started grape seed extract and oil of oregano about a month ago. He  has a generalized rash , red, raised in appearance on chest, back ,arms. Dr Irene Limbo aware and will see pt.PT came in with temp of 25F.

## 2016-12-27 ENCOUNTER — Encounter: Payer: Self-pay | Admitting: Hematology

## 2016-12-27 ENCOUNTER — Ambulatory Visit: Payer: Medicare Other

## 2016-12-27 ENCOUNTER — Telehealth: Payer: Self-pay | Admitting: Hematology

## 2016-12-27 ENCOUNTER — Other Ambulatory Visit (HOSPITAL_BASED_OUTPATIENT_CLINIC_OR_DEPARTMENT_OTHER): Payer: Medicare Other

## 2016-12-27 ENCOUNTER — Ambulatory Visit (HOSPITAL_BASED_OUTPATIENT_CLINIC_OR_DEPARTMENT_OTHER): Payer: Medicare Other | Admitting: Hematology

## 2016-12-27 VITALS — BP 123/71 | HR 96 | Temp 97.9°F | Resp 18 | Ht 68.0 in | Wt 184.6 lb

## 2016-12-27 DIAGNOSIS — E8581 Light chain (AL) amyloidosis: Secondary | ICD-10-CM

## 2016-12-27 DIAGNOSIS — N049 Nephrotic syndrome with unspecified morphologic changes: Secondary | ICD-10-CM | POA: Diagnosis not present

## 2016-12-27 DIAGNOSIS — L270 Generalized skin eruption due to drugs and medicaments taken internally: Secondary | ICD-10-CM | POA: Diagnosis not present

## 2016-12-27 DIAGNOSIS — E8809 Other disorders of plasma-protein metabolism, not elsewhere classified: Secondary | ICD-10-CM

## 2016-12-27 DIAGNOSIS — M7989 Other specified soft tissue disorders: Secondary | ICD-10-CM

## 2016-12-27 LAB — CBC & DIFF AND RETIC
BASO%: 2 % (ref 0.0–2.0)
Basophils Absolute: 0.1 10*3/uL (ref 0.0–0.1)
EOS%: 10.2 % — AB (ref 0.0–7.0)
Eosinophils Absolute: 0.4 10*3/uL (ref 0.0–0.5)
HCT: 34.6 % — ABNORMAL LOW (ref 38.4–49.9)
HGB: 11.8 g/dL — ABNORMAL LOW (ref 13.0–17.1)
IMMATURE RETIC FRACT: 4.8 % (ref 3.00–10.60)
LYMPH#: 1 10*3/uL (ref 0.9–3.3)
LYMPH%: 29.8 % (ref 14.0–49.0)
MCH: 33 pg (ref 27.2–33.4)
MCHC: 34.1 g/dL (ref 32.0–36.0)
MCV: 96.6 fL (ref 79.3–98.0)
MONO#: 0.4 10*3/uL (ref 0.1–0.9)
MONO%: 12 % (ref 0.0–14.0)
NEUT%: 46 % (ref 39.0–75.0)
NEUTROS ABS: 1.6 10*3/uL (ref 1.5–6.5)
PLATELETS: 170 10*3/uL (ref 140–400)
RBC: 3.58 10*6/uL — ABNORMAL LOW (ref 4.20–5.82)
RDW: 14.5 % (ref 11.0–14.6)
RETIC %: 1.04 % (ref 0.80–1.80)
RETIC CT ABS: 37.23 10*3/uL (ref 34.80–93.90)
WBC: 3.4 10*3/uL — AB (ref 4.0–10.3)

## 2016-12-27 LAB — COMPREHENSIVE METABOLIC PANEL
ALT: 38 U/L (ref 0–55)
ANION GAP: 6 meq/L (ref 3–11)
AST: 66 U/L — ABNORMAL HIGH (ref 5–34)
Albumin: 2.3 g/dL — ABNORMAL LOW (ref 3.5–5.0)
Alkaline Phosphatase: 94 U/L (ref 40–150)
BUN: 14.2 mg/dL (ref 7.0–26.0)
CHLORIDE: 103 meq/L (ref 98–109)
CO2: 28 meq/L (ref 22–29)
CREATININE: 0.9 mg/dL (ref 0.7–1.3)
Calcium: 8.3 mg/dL — ABNORMAL LOW (ref 8.4–10.4)
EGFR: >60 mL/min/{1.73_m2} (ref >60)
GLUCOSE: 63 mg/dL — AB (ref 70–140)
Potassium: 4 mEq/L (ref 3.5–5.1)
SODIUM: 138 meq/L (ref 136–145)
TOTAL PROTEIN: 4.5 g/dL — AB (ref 6.4–8.3)
Total Bilirubin: 0.51 mg/dL (ref 0.20–1.20)

## 2016-12-27 MED ORDER — PREDNISONE 20 MG PO TABS: ORAL_TABLET | ORAL | 0 refills | Status: DC

## 2016-12-27 MED ORDER — ACYCLOVIR 200 MG PO CAPS: 400.0000 mg | ORAL_CAPSULE | Freq: Every day | ORAL | 1 refills | Status: DC

## 2016-12-27 NOTE — Telephone Encounter (Signed)
Scheduled additional appt per 11/13 los - and Dr. Irene Limbo request

## 2016-12-27 NOTE — Patient Instructions (Addendum)
PATIENT INSTRUCTIONS  -Continue cetirizine/Zyrtec 10 mg p.o. Daily -May use additional Benadryl 25 mg p.o. daily every 8 hours as needed for rash and itching. -Continue famotidine/Pepcid 20 mg p.o. twice daily. -Prescription has been sent for prednisone to your pharmacy. -I have sent a new prescription for acyclovir capsules which you should start taking instead of your current acyclovir tablets. -We will recommend holding all your supplements at this time. -Use only lukewarm short showers. -We will keep her skin well moisturized. -Use nonmedicated nondeodorant containing mild soaps. -Call if your rash gets significantly worse. -We shall see her back in about a week to reevaluate your rash     Thank you for choosing [facility] to provide your oncology and hematology care.  To afford each patient quality time with our providers, please arrive 30 minutes before your scheduled appointment time.  If you arrive late for your appointment, you may be asked to reschedule.  We strive to give you quality time with our providers, and arriving late affects you and other patients whose appointments are after yours.   If you are a no show for multiple scheduled visits, you may be dismissed from the clinic at the providers discretion.    Again, thank you for choosing [facility], our hope is that these requests will decrease the amount of time that you wait before being seen by our physicians.  ______________________________________________________________________  Should you have questions after your visit to the [facility], please contact our office at [phone] between the hours of 8:30 and 4:30 p.m.    Voicemails left after 4:30p.m will not be returned until the following business day.    For prescription refill requests, please have your pharmacy contact us directly.  Please also try to allow 48 hours for prescription requests.    Please contact the  scheduling department for questions regarding scheduling.  For scheduling of procedures such as PET scans, CT scans, MRI, Ultrasound, etc please contact central scheduling at [phone].    Resources For Cancer Patients and Caregivers:   Oncolink.org:  A wonderful resource for patients and healthcare providers for information regarding your disease, ways to tract your treatment, what to expect, etc.     [place]:  (443)194-1728  Can help patients locate various types of support and financial assistance  Cancer Care: 1-800-813-HOPE 2046818836) Provides financial assistance, online support groups, medication/co-pay assistance.    [place]:  947-065-4391 Where to apply for food stamps, Medicaid, and utility assistance  Medicare Rights Center: 256-794-8355 Helps people with Medicare understand their rights and benefits, navigate the Medicare system, and secure the quality healthcare they deserve  [employer]: [place] [employer]'s shared-ride transportation service for eligible riders who have a disability that prevents them from riding the fixed route bus.    For additional information on assistance programs please contact our social worker:   [place]:  [phone]

## 2016-12-27 NOTE — Telephone Encounter (Signed)
Did not schedule anything per 11/13 los - Dr. Irene Limbo is booked in one week and patient already had appt scheduled for 11/27 - left appt as is and cancelled per los. - gave patient updated calender and AVS .

## 2017-01-02 NOTE — Progress Notes (Signed)
[name]    HEMATOLOGY/ONCOLOGY FOLLOW UP NOTE  Date of Service: .12/27/2016  Patient Care Team: Unk Pinto, MD as PCP - General (Internal Medicine) Inda Castle, MD as Consulting Physician (Gastroenterology) Rexene Agent, MD as Attending Physician (Nephrology) Pearson Grippe MD -nephrology   CHIEF COMPLAINTS/PURPOSE OF CONSULTATION:  Renal AL Amyloidosis  HISTORY OF PRESENTING ILLNESS:  Gregory Galvan is a wonderful 76 y.o. male who has been referred to [place] by Dr .Unk Pinto, MD /Dr Pearson Grippe MD for evaluation and management of newly diagnosed Renal AL Amyloidosis.  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   He recently developed lower extremity swelling and increasing proteinuria and was referred to nephrology and was seen by Dr. Pearson Grippe at [facility].   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  SPEP showed a small M spike of 0.2 g/dL IFE showing IgG lambda monoclonal paraproteinemia. Increase in serum lambda light chain with a decreased kappa/Lambda light chain ratio.  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 [place] red stain positive.  Patient was referred to [place] for further evaluation and management of his newly diagnosed AL Amyloidosis .  Patient notes his leg swelling is better with diuretics.  Notes no fever no chills no night sweats no unexpected weight loss. No focal bone pains. Labs have not shown any overt anemia or hypercalcemia. His creatinine is relatively normal at 1. Hypoalbuminemia with albumin level of 2.7 g/dL.  INTERVAL HISTORY  Patient presents to the clinic today for follow up of his renal AL amyloidosis and for his next weekly cycle  of treatment.  His rash has been more pruritic and scaly over the trunk and upper extremities upper extremities. No oral or genital ulceration.  No other acute new symptoms. We discussed completely discontinuing all his supplements and holding his treatment for 1 or 2 weeks until his rash resolves.  It is unclear what he is reacting to currently. He was recommended using non-detergent nonmedicated soaps and have short showers. No shortness of breath no chest pain no angioedema.   MEDICAL HISTORY:  Past Medical History:  Diagnosis Date  . BPH (benign prostatic hyperplasia)   . Hyperlipidemia   . Hypertension   . Hypogonadism, male   . Pre-diabetes   . Prostatitis   . 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 No surgical procedure performed. History of hypertension for 30 years on ACE inhibitor, beta blocker and Lasix Hypothyroidism   SURGICAL HISTORY: Past Surgical History:  Procedure Laterality Date  . APPENDECTOMY      SOCIAL HISTORY: Social History   Socioeconomic History  . Marital status: Divorced    Spouse name: Not on file  . Number of children: Not on file  . Years of education: Not on file  . Highest education level: Not on file  Social Needs  . Financial resource strain: Not on file  . Food insecurity - worry: Not on file  . Food insecurity - inability: Not on file  . Transportation needs - medical: Not on file  . Transportation needs - non-medical: Not on file  Occupational History  . Not on file  Tobacco Use  . Smoking status: Former  Smoker    Last attempt to quit: 12/22/2013    Years since quitting: 3.0  . Smokeless tobacco: Never Used  Substance and Sexual Activity  . Alcohol use: Yes    Alcohol/week: 16.8 oz    Types: 28 Standard drinks or equivalent per week    Comment: drinks wine  . Drug use: No  . Sexual activity: Not on file  Other Topics Concern  . Not on file  Social History  Narrative  . Not on file    FAMILY HISTORY: Family History  Problem Relation Age of Onset  . Cancer Mother        breast  . Alzheimer's disease Mother   . Hypertension Father   . Heart disease Father   . Lymphoma Father   . Hypertension Brother   . Heart disease Brother   . Heart disease Brother   . Hypertension Brother   . Diabetes Brother     ALLERGIES:  is allergic to ppd [tuberculin purified protein derivative]; sulfonamide derivatives; and penicillins.  MEDICATIONS:  Current Outpatient Medications  Medication Sig Dispense Refill  . ALPRAZolam (XANAX) 0.5 MG tablet take 1/2 to 1 tablet by mouth at bedtime if needed 90 tablet 1  . Cholecalciferol (VITAMIN D) 2000 units tablet Take 8,000 Units by mouth daily.    [name] ezetimibe (ZETIA) 10 MG tablet Take 1 tablet daily for Cholesterol 90 tablet 1  . furosemide (LASIX) 40 MG tablet 1-2 pills a day for swelling (Patient taking differently: Take 80 mg by mouth daily. ) 60 tablet 11  . latanoprost (XALATAN) 0.005 % ophthalmic solution Place 1 drop into both eyes at bedtime.     [name] levothyroxine (SYNTHROID, LEVOTHROID) 175 MCG tablet take 1 and 1/2 tablets by mouth once daily or as directed 135 tablet 1  . lisinopril (PRINIVIL,ZESTRIL) 20 MG tablet Take 20 mg by mouth daily.  0  . Magnesium 100 MG TABS Take 300 mg by mouth daily.    . milk thistle 175 MG tablet Take 175 mg by mouth daily.    . Multiple Vitamins-Minerals (MULTIVITAMIN WITH MINERALS) tablet Take 1 tablet by mouth daily.    . ondansetron (ZOFRAN) 8 MG tablet Take 1 tablet (8 mg total) by mouth 2 (two) times daily as needed for refractory nausea / vomiting. Start on day 3 after chemo. 30 tablet 1  . OVER THE COUNTER MEDICATION     . PARoxetine (PAXIL) 20 MG tablet Take 10 mg by mouth daily.    . prochlorperazine (COMPAZINE) 10 MG tablet Take 1 tablet (10 mg total) by mouth every 6 (six) hours as needed (Nausea or vomiting). 30 tablet 1  . Turmeric 500 MG CAPS Take 1,500 mg  elemental calcium/kg/hr by mouth daily.    [name] acyclovir (ZOVIRAX) 200 MG capsule Take 2 capsules (400 mg total) daily by mouth. This replaces your acyclovir tabs due to allergy concern 60 capsule 1  . predniSONE (DELTASONE) 20 MG tablet 41m po daily with breakfast for 3 days then 41mpo daily x 4 days then prednisone 2015mo daily until reassessment in about 1 week 60 tablet 0   No current facility-administered medications for this visit.     REVIEW OF SYSTEMS:    10 Point review of Systems was done is negative except as noted above.  PHYSICAL EXAMINATION: ECOG PERFORMANCE STATUS: 1 - Symptomatic but completely ambulatory  . Vitals:   12/27/16 1017  BP: 123/71  Pulse: 96  Resp: 18  Temp: 97.9 F (  36.6 C)  SpO2: 100%   Filed Weights   12/27/16 1017  Weight: 184 lb 9.6 oz (83.7 kg)   .Body mass index is 28.07 kg/m.  GENERAL:alert, in no acute distress and comfortable SKIN: no acute rashes, no significant lesions EYES: conjunctiva are pink and non-injected, sclera anicteric OROPHARYNX: MMM, no exudates, no oropharyngeal erythema or ulceration NECK: supple, no JVD LYMPH:  no palpable lymphadenopathy in the cervical, axillary or inguinal regions LUNGS: clear to auscultation b/l with normal respiratory effort HEART: regular rate & rhythm ABDOMEN:  normoactive bowel sounds , non tender, not distended. No palpable hepatosplenomegaly Extremity: 2+ b/l pitting pedal edema PSYCH: alert & oriented x 3 with fluent speech NEURO: no focal motor/sensory deficits  LABORATORY DATA:  I have reviewed the data as listed  . CBC Latest Ref Rng & Units 12/27/2016 12/20/2016 12/13/2016  WBC 4.0 - 10.3 10e3/uL 3.4(L) 4.5 4.8  Hemoglobin 13.0 - 17.1 g/dL 11.8(L) 12.6(L) 12.3(L)  Hematocrit 38.4 - 49.9 % 34.6(L) 36.6(L) 35.6(L)  Platelets 140 - 400 10e3/uL 170 175 239    . CMP Latest Ref Rng & Units 12/27/2016 12/20/2016 12/13/2016  Glucose 70 - 140 mg/dl 63(L) 85 70  BUN 7.0 - 26.0  mg/dL 14.2 15.0 11.1  Creatinine 0.7 - 1.3 mg/dL 0.9 0.9 0.8  Sodium 136 - 145 mEq/L 138 135(L) 134(L)  Potassium 3.5 - 5.1 mEq/L 4.0 3.5 4.1  Chloride 98 - 110 mmol/L - - -  CO2 22 - 29 mEq/L _0 Calcium 8.4 - 10.4 mg/dL 8.3(L) 8.0(L) 8.6  Total Protein 6.4 - 8.3 g/dL 4.5(L) 4.5(L) 4.8(L)  Total Bilirubin 0.20 - 1.20 mg/dL 0.51 0.55 0.45  Alkaline Phos 40 - 150 U/L 94 84 77  AST 5 - 34 U/L 66(H) 53(H) 48(H)  ALT 0 - 55 U/L 38 31 32   Component     Latest Ref Rng & Units 10/25/2016 11/22/2016  IgG (Immunoglobin G), Serum     700 - 1,600 mg/dL 496 (L) 308 (L)  IgA/Immunoglobulin A, Serum     61 - 437 mg/dL 76 51 (L)  IgM, Qn, Serum     15 - 143 mg/dL 22 16  Total Protein     6.0 - 8.5 g/dL 4.9 (L) 4.6 (L)  Albumin SerPl Elph-Mcnc     2.9 - 4.4 g/dL 2.6 (L) 2.3 (L)  Alpha 1     0.0 - 0.4 g/dL 0.2 0.2  Alpha2 Glob SerPl Elph-Mcnc     0.4 - 1.0 g/dL 0.9 0.9  B-Globulin SerPl Elph-Mcnc     0.7 - 1.3 g/dL 0.7 0.8  Gamma Glob SerPl Elph-Mcnc     0.4 - 1.8 g/dL 0.4 0.3 (L)  M Protein SerPl Elph-Mcnc     Not Observed g/dL 0.2 (H) 0.1 (H)  Globulin, Total     2.2 - 3.9 g/dL 2.3 2.3  Albumin/Glob SerPl     0.7 - 1.7 1.2 1.1  IFE 1      Comment Comment  Please Note (HCV):      Comment Comment  Ig Kappa Free Light Chain     3.3 - 19.4 mg/L 9.5 7.4  Ig Lambda Free Light Chain     5.7 - 26.3 mg/L 120.9 (H) 66.7 (H)  Kappa/Lambda FluidC Ratio     0.26 - 1.65 0.08 (L) 0.11 (L)        Component     Latest Ref Rng & Units 08/22/2016 10/07/2016  C3 Complement  82 - 185 mg/dL 113   C4 Complement     15 - 53 mg/dL 32   Anit Nuclear Antibody(ANA)     NEGATIVE NEG   RPR     NON REAC NON REAC   Angiotensin-Converting Enzyme     9 - 67 U/L 17   ds DNA Ab     IU/mL 1   ANCA SCREEN      Negative   Hep C Virus Ab     0.0 - 0.9 s/co ratio  0.1   Component     Latest Ref Rng & Units 10/07/2016  Hep C Virus Ab     0.0 - 0.9 s/co ratio 0.1  Hepatitis B Surface Ag      Negative Negative  HIV Screen 4th Generation wRfx     Non Reactive Non Reactive                                   *[place]*                  *[facility] [employer].                        [place], [place] [address]                            [phone]  ------------------------------------------------------------------- Transthoracic Echocardiography  Patient:    Elzy, Tomasello MR #:       [id number] Study Date: 10/12/2016 Gender:     M Age:        72 Height:     172.7 cm Weight:     77.7 kg BSA:        1.94 m^2 Pt. Status: Room:   SONOGRAPHER  Dustin Flock, RCS  PERFORMING   [facility], Outpatient  ATTENDING    [place], [place], [place] Kish[place]  REFERRING    [place], [place] Kishore  cc:  ------------------------------------------------------------------- LV EF: 65% -   70%  ------------------------------------------------------------------- Indications:      Pre-op evaluation V728.1.  ------------------------------------------------------------------- History:   PMH:  Renal Amyloidosis, Hypertension, pre-chemo.  ------------------------------------------------------------------- Study Conclusions  - Left ventricle: The cavity size was normal. Wall thickness was   increased in a pattern of moderate LVH. Systolic function was   vigorous. The estimated ejection fraction was in the range of 65%   to 70%. Wall motion was normal; there were no regional wall   motion abnormalities. Doppler parameters are consistent with   abnormal left ventricular relaxation (grade 1 diastolic   dysfunction). - Mitral valve: There was trivial regurgitation. - Left atrium: The atrium was mildly dilated.    RADIOGRAPHIC STUDIES:  I have personally reviewed the radiological images as listed and agreed with the findings in the report. No results found.  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  -No hypercalcemia, normal creatinine level, no anemia. -No evidence of bone lesions on whole body skeletal survey. BM Bx- no  evidence of myeloma  Echo shows normal ejection fraction with grade 1 diastolic dysfunction.  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  Hepatitis C, HIV and hepatitis B negative. Other workup as per nephrologist negative. Patient follows with Dr. Pearson Grippe  [facility].  #3 Grade 2 macular/desquamative rash over trunk and extremities with no evidence of mucosal involvement. PLAN -labs stable today. His recent SPEP and SFLC show response to treatment with improvement in M spike from 0.2 to 0.1g/dl and Seru free Lambda light chains from 120 to 66.7. 24h UPEP total protein has decreased from 4.22 to 2.9 g per 24 hours. - given grade 2 skin rash - we shall hold his treatment medications at this time. -Continue cetirizine/Zyrtec 10 mg p.o. Daily -May use additional Benadryl 25 mg p.o. daily every 8 hours as needed for rash and itching. -Continue famotidine/Pepcid 20 mg p.o. twice daily. -Prescription has been sent for prednisone to his pharmacy. -I have sent a new prescription for acyclovir capsules which you should start taking instead of your current acyclovir tablets. (incase allergic to tab coating) -We will recommend holding all your supplements at this time. -Use only lukewarm short showers. -We will keep her skin well moisturized. -Use nonmedicated nondeodorant containing mild soaps. -Call if your rash gets significantly worse. -We shall see her back in about a week to reevaluate your rash  #3 Leg swelling - due to hypoalbuminemia from his nephrotic syndrome + Steroids Plan -continue diuretics as per nephrologist. Currently taking lasix  85m po daily. -. I also urged him to only consume 48-64oz daily to reduce that amount of fluid he retains.  -minimize salt intake - compression socks today.  -elevated legs as much as possible.  Orders Placed This Encounter  Procedures  . CBC & Diff and Retic    Standing Status:   Future    Standing Expiration Date:   12/27/2017  . Comprehensive metabolic panel    Standing Status:   Future    Standing Expiration Date:   12/27/2017   Hold treatment today and next week. RTC with Dr KIrene Limboin 1 week with labs to evaluate for skin rash improvement   All of the patients questions were answered with apparent satisfaction. The patient knows to call the clinic with any problems, questions or concerns.  I spent 20 minutes counseling the patient face to face. The total time spent in the appointment was 25 minutes and more than 50% was on counseling and direct patient cares.  GSullivan LoneMD MLickingAAHIVMS SCpgi [facility] LLCCSt [facility] - MercycareHematology/Oncology Physician CAllegheny [facility] (Office):       [phone](Work cell):  [phone](Fax):           [phone]

## 2017-01-02 NOTE — Progress Notes (Signed)
[name]    HEMATOLOGY/ONCOLOGY FOLLOW UP NOTE  Date of Service: 01/02/2017   Patient Care Team: Unk Pinto, MD as PCP - General (Internal Medicine) Inda Castle, MD as Consulting Physician (Gastroenterology) Rexene Agent, MD as Attending Physician (Nephrology) Pearson Grippe MD -nephrology   CHIEF COMPLAINTS/PURPOSE OF CONSULTATION:  Renal AL Amyloidosis  HISTORY OF PRESENTING ILLNESS:  Gregory Galvan is a wonderful 76 y.o. male who has been referred to [place] by Dr .Unk Pinto, MD /Dr Pearson Grippe MD for evaluation and management of newly diagnosed Renal AL Amyloidosis.  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   He recently developed lower extremity swelling and increasing proteinuria and was referred to nephrology and was seen by Dr. Pearson Grippe at [facility].   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  SPEP showed a small M spike of 0.2 g/dL IFE showing IgG lambda monoclonal paraproteinemia. Increase in serum lambda light chain with a decreased kappa/Lambda light chain ratio.  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 [place] red stain positive.  Patient was referred to [place] for further evaluation and management of his newly diagnosed AL Amyloidosis .  Patient notes his leg swelling is better with diuretics.  Notes no fever no chills no night sweats no unexpected weight loss. No focal bone pains. Labs have not shown any overt anemia or hypercalcemia. His creatinine is relatively normal at 1. Hypoalbuminemia with albumin level of 2.7 g/dL.  INTERVAL HISTORY  Gregory Galvan presents to the clinic today for follow up of his renal AL amyloidosis and for reevaluation  of his rash. His rash has over the trunk and upper extremities upper extremities has significantly improved.  No fevers no chills no oral or genital ulcerations. No other acute new symptoms. We rediscussed completely discontinuing all his supplements . He was recommended using non-detergent nonmedicated soaps and have short showers. We discussed restarting him on dexamethasone and Velcade today and continue to hold off on cyclophosphamide for a couple of weeks.   MEDICAL HISTORY:  Past Medical History:  Diagnosis Date  . BPH (benign prostatic hyperplasia)   . Hyperlipidemia   . Hypertension   . Hypogonadism, male   . Pre-diabetes   . Prostatitis   . 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 No surgical procedure performed. History of hypertension for 30 years on ACE inhibitor, beta blocker and Lasix Hypothyroidism   SURGICAL HISTORY: Past Surgical History:  Procedure Laterality Date  . APPENDECTOMY      SOCIAL HISTORY: Social History   Socioeconomic History  . Marital status: Divorced    Spouse name: Not on file  . Number of children: Not on file  . Years of education: Not on file  . Highest education level: Not on file  Social Needs  . Financial resource strain: Not on file  . Food insecurity - worry: Not on file  . Food insecurity - inability: Not on file  . Transportation needs - medical: Not on file  . Transportation needs - non-medical: Not on file  Occupational History  . Not on file  Tobacco Use  . Smoking status: Former Smoker    Last attempt to quit:  12/22/2013    Years since quitting: 3.0  . Smokeless tobacco: Never Used  Substance and Sexual Activity  . Alcohol use: Yes    Alcohol/week: 16.8 oz    Types: 28 Standard drinks or equivalent per week    Comment: drinks wine  . Drug use: No  . Sexual activity: Not on file  Other Topics Concern  . Not on file  Social History Narrative  . Not on  file    FAMILY HISTORY: Family History  Problem Relation Age of Onset  . Cancer Mother        breast  . Alzheimer's disease Mother   . Hypertension Father   . Heart disease Father   . Lymphoma Father   . Hypertension Brother   . Heart disease Brother   . Heart disease Brother   . Hypertension Brother   . Diabetes Brother     ALLERGIES:  is allergic to ppd [tuberculin purified protein derivative]; sulfonamide derivatives; and penicillins.  MEDICATIONS:  Current Outpatient Medications  Medication Sig Dispense Refill  . acyclovir (ZOVIRAX) 200 MG capsule Take 2 capsules (400 mg total) daily by mouth. This replaces your acyclovir tabs due to allergy concern 60 capsule 1  . ALPRAZolam (XANAX) 0.5 MG tablet take 1/2 to 1 tablet by mouth at bedtime if needed 90 tablet 1  . Cholecalciferol (VITAMIN D) 2000 units tablet Take 8,000 Units by mouth daily.    [name] ezetimibe (ZETIA) 10 MG tablet Take 1 tablet daily for Cholesterol 90 tablet 1  . furosemide (LASIX) 40 MG tablet 1-2 pills a day for swelling (Patient taking differently: Take 80 mg by mouth daily. ) 60 tablet 11  . latanoprost (XALATAN) 0.005 % ophthalmic solution Place 1 drop into both eyes at bedtime.     [name] levothyroxine (SYNTHROID, LEVOTHROID) 175 MCG tablet take 1 and 1/2 tablets by mouth once daily or as directed 135 tablet 1  . lisinopril (PRINIVIL,ZESTRIL) 20 MG tablet Take 20 mg by mouth daily.  0  . Magnesium 100 MG TABS Take 300 mg by mouth daily.    . milk thistle 175 MG tablet Take 175 mg by mouth daily.    . Multiple Vitamins-Minerals (MULTIVITAMIN WITH MINERALS) tablet Take 1 tablet by mouth daily.    . ondansetron (ZOFRAN) 8 MG tablet Take 1 tablet (8 mg total) by mouth 2 (two) times daily as needed for refractory nausea / vomiting. Start on day 3 after chemo. 30 tablet 1  . OVER THE COUNTER MEDICATION     . PARoxetine (PAXIL) 20 MG tablet Take 10 mg by mouth daily.    . predniSONE (DELTASONE) 20 MG tablet '60mg'$  po  daily with breakfast for 3 days then '40mg'$  po daily x 4 days then prednisone '20mg'$  po daily until reassessment in about 1 week 60 tablet 0  . prochlorperazine (COMPAZINE) 10 MG tablet Take 1 tablet (10 mg total) by mouth every 6 (six) hours as needed (Nausea or vomiting). 30 tablet 1  . Turmeric 500 MG CAPS Take 1,500 mg elemental calcium/kg/hr by mouth daily.     No current facility-administered medications for this visit.     REVIEW OF SYSTEMS:    10 Point review of Systems was done is negative except as noted above.  PHYSICAL EXAMINATION: ECOG PERFORMANCE STATUS: 1 - Symptomatic but completely ambulatory  . Vitals:   01/03/17 1408  BP: (!) 142/94  Pulse: 80  Resp: 19  Temp: 97.8 F (36.6 C)  SpO2: 100%  Filed Weights   01/03/17 1408  Weight: 184 lb (83.5 kg)   .Body mass index is 27.98 kg/m.  GENERAL:alert, in no acute distress and comfortable SKIN: no acute rashes, no significant lesions EYES: conjunctiva are pink and non-injected, sclera anicteric OROPHARYNX: MMM, no exudates, no oropharyngeal erythema or ulceration NECK: supple, no JVD LYMPH:  no palpable lymphadenopathy in the cervical, axillary or inguinal regions LUNGS: clear to auscultation b/l with normal respiratory effort HEART: regular rate & rhythm ABDOMEN:  normoactive bowel sounds , non tender, not distended. No palpable hepatosplenomegaly Extremity: 2+ b/l pitting pedal edema PSYCH: alert & oriented x 3 with fluent speech NEURO: no focal motor/sensory deficits  LABORATORY DATA:  I have reviewed the data as listed  . CBC Latest Ref Rng & Units 01/03/2017 12/27/2016  WBC 4.0 - 10.3 10e3/uL 7.7 3.4(L)  Hemoglobin 13.0 - 17.1 g/dL 11.0(L) 11.8(L)  Hematocrit 38.4 - 49.9 % 32.2(L) 34.6(L)  Platelets 140 - 400 10e3/uL 262 170    . CMP Latest Ref Rng & Units 01/03/2017 01/03/2017  Glucose 70 - 140 mg/dl 89 -  BUN 7.0 - 26.0 mg/dL 22.0 -  Creatinine 0.7 - 1.3 mg/dL 0.8 -  Sodium 136 - 145 mEq/L 137  -  Potassium 3.5 - 5.1 mEq/L 3.8 -  Chloride 98 - 110 mmol/L - -  CO2 22 - 29 mEq/L 26 -  Calcium 8.4 - 10.4 mg/dL 8.2(L) -  Total Protein 6.4 - 8.3 g/dL 4.7(L) 4.3(L)  Total Bilirubin 0.20 - 1.20 mg/dL 0.38 -  Alkaline Phos 40 - 150 U/L 79 -  AST 5 - 34 U/L 39(H) -  ALT 0 - 55 U/L 33 -   Component     Latest Ref Rng & Units 10/25/2016 11/22/2016  IgG (Immunoglobin G), Serum     700 - 1,600 mg/dL 496 (L) 308 (L)  IgA/Immunoglobulin A, Serum     61 - 437 mg/dL 76 51 (L)  IgM, Qn, Serum     15 - 143 mg/dL 22 16  Total Protein     6.0 - 8.5 g/dL 4.9 (L) 4.6 (L)  Albumin SerPl Elph-Mcnc     2.9 - 4.4 g/dL 2.6 (L) 2.3 (L)  Alpha 1     0.0 - 0.4 g/dL 0.2 0.2  Alpha2 Glob SerPl Elph-Mcnc     0.4 - 1.0 g/dL 0.9 0.9  B-Globulin SerPl Elph-Mcnc     0.7 - 1.3 g/dL 0.7 0.8  Gamma Glob SerPl Elph-Mcnc     0.4 - 1.8 g/dL 0.4 0.3 (L)  M Protein SerPl Elph-Mcnc     Not Observed g/dL 0.2 (H) 0.1 (H)  Globulin, Total     2.2 - 3.9 g/dL 2.3 2.3  Albumin/Glob SerPl     0.7 - 1.7 1.2 1.1  IFE 1      Comment Comment  Please Note (HCV):      Comment Comment  Ig Kappa Free Light Chain     3.3 - 19.4 mg/L 9.5 7.4  Ig Lambda Free Light Chain     5.7 - 26.3 mg/L 120.9 (H) 66.7 (H)  Kappa/Lambda FluidC Ratio     0.26 - 1.65 0.08 (L) 0.11 (L)        Component     Latest Ref Rng & Units 08/22/2016 10/07/2016  C3 Complement     82 - 185 mg/dL 113   C4 Complement     15 - 53 mg/dL 32   Anit Nuclear Antibody(ANA)  NEGATIVE NEG   RPR     NON REAC NON REAC   Angiotensin-Converting Enzyme     9 - 67 U/L 17   ds DNA Ab     IU/mL 1   ANCA SCREEN      Negative   Hep C Virus Ab     0.0 - 0.9 s/co ratio  0.1   Component     Latest Ref Rng & Units 10/07/2016  Hep C Virus Ab     0.0 - 0.9 s/co ratio 0.1  Hepatitis B Surface Ag     Negative Negative  HIV Screen 4th Generation wRfx     Non Reactive Non Reactive                                   *[place]*                   *[address] [employer] [address].                        [place], [place] [address]                            [phone]  ------------------------------------------------------------------- Transthoracic Echocardiography  Patient:    Dino, Borntreger MR #:       [id number] Study Date: 10/12/2016 Gender:     M Age:        48 Height:     172.7 cm Weight:     77.7 kg BSA:        1.94 m^2 Pt. Status: Room:   SONOGRAPHER  Dustin Flock, RCS  PERFORMING   [facility], Outpatient  ATTENDING    [place], [place], [place] [facility]  REFERRING    [place], [place] [facility]  cc:  ------------------------------------------------------------------- LV EF: 65% -   70%  ------------------------------------------------------------------- Indications:      Pre-op evaluation V728.1.  ------------------------------------------------------------------- History:   PMH:  Renal Amyloidosis, Hypertension, pre-chemo.  ------------------------------------------------------------------- Study Conclusions  - Left ventricle: The cavity size was normal. Wall thickness was   increased in a pattern of moderate LVH. Systolic function was   vigorous. The estimated ejection fraction was in the range of 65%   to 70%. Wall motion was normal; there were no regional wall   motion abnormalities. Doppler parameters are consistent with   abnormal left ventricular relaxation (grade 1 diastolic   dysfunction). - Mitral valve: There was trivial regurgitation. - Left atrium: The atrium was mildly dilated.    RADIOGRAPHIC STUDIES:  I have personally reviewed the radiological images as listed and agreed with the findings in the report. No results found.  ASSESSMENT & PLAN:   76 year old Caucasian male with  #1 Renal AL Amyloidosis. Lambda Light chain subtype Significant decrease in serum lambda free light chains following treatment  No overt  evidence of multiple myeloma thus far.  -No hypercalcemia, normal creatinine level, no anemia. -No evidence of bone lesions on whole body skeletal survey. BM Bx- no evidence of myeloma  09/2016 Echo shows normal ejection fraction with grade 1 diastolic dysfunction.  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  Hepatitis C, HIV and hepatitis  B negative. Other workup as per nephrologist negative. Patient follows with Dr. Pearson Grippe  [facility].  #3 Grade 1 macular/desquamative rash over trunk and extremities with no evidence of mucosal involvement. Rash is significantly improved since his last clinic visit. Thought to be related to possible over-the-counter supplements or coating of his acyclovir which has been changed to capsules or his medications. PLAN  -labs stable today and his rash is significantly improved. -We will restart him on dexamethasone and Velcade today and monitor for worsening rash. -We will continue to hold Cytoxan at this time. --Continue cetirizine/Zyrtec 10 mg p.o. Daily -May use additional Benadryl 25 mg p.o. daily every 8 hours as needed for rash and itching.  -Continue famotidine/Pepcid 20 mg p.o. twice daily. -Prescribed prednisone to his pharmacy on 12/27/16 -complete course of prednisone -Continue acyclovir capsules instead of your current acyclovir tablets. (incase allergic to tab coating) -Continue holding all your supplements at this time. -Use only lukewarm short showers. -We will keep her skin well moisturized. -Use nonmedicated nondeodorant containing mild soaps. -Call if your rash gets significantly worse. -We shall see her back in about a week to reevaluate your rash  #3 Leg swelling - due to hypoalbuminemia from his nephrotic syndrome + Steroids Plan -continue diuretics as per nephrologist. Currently taking lasix '80mg'$  po daily. -. I also urged him to only consume 48-64oz daily to reduce that amount of fluid he retains.  -minimize salt  intake -I encouraged him to wear compression socks and elevate legs as much as possible.  No orders of the defined types were placed in this encounter.  RTC with Dr Irene Limbo in 1 week with labs to evaluate for skin rash improvement   All of the patients questions were answered with apparent satisfaction. The patient knows to call the clinic with any problems, questions or concerns.  I spent 20 minutes counseling the patient face to face. The total time spent in the appointment was 25 minutes and more than 50% was on counseling and direct patient cares.  Sullivan Lone MD Brady AAHIVMS [facility] Hematology/Oncology Physician [facility]  (Office):       [phone] (Work cell):  [phone] (Fax):           [phone]  This document serves as a record of services personally performed by Sullivan Lone, MD. It was created on his behalf by Joslyn Devon, a trained medical scribe. The creation of this record is based on the scribe's personal observations and the provider's statements to them.   .I have reviewed the above documentation for accuracy and completeness, and I agree with the above. Brunetta Genera MD MS

## 2017-01-03 ENCOUNTER — Other Ambulatory Visit (HOSPITAL_BASED_OUTPATIENT_CLINIC_OR_DEPARTMENT_OTHER): Payer: Medicare Other

## 2017-01-03 ENCOUNTER — Telehealth: Payer: Self-pay | Admitting: Hematology

## 2017-01-03 ENCOUNTER — Ambulatory Visit (HOSPITAL_BASED_OUTPATIENT_CLINIC_OR_DEPARTMENT_OTHER): Payer: Medicare Other

## 2017-01-03 ENCOUNTER — Encounter: Payer: Self-pay | Admitting: Hematology

## 2017-01-03 ENCOUNTER — Other Ambulatory Visit: Payer: Medicare Other

## 2017-01-03 ENCOUNTER — Ambulatory Visit (HOSPITAL_BASED_OUTPATIENT_CLINIC_OR_DEPARTMENT_OTHER): Payer: Medicare Other | Admitting: Hematology

## 2017-01-03 ENCOUNTER — Ambulatory Visit: Payer: Medicare Other

## 2017-01-03 VITALS — BP 142/94 | HR 80 | Temp 97.8°F | Resp 19 | Ht 68.0 in | Wt 184.0 lb

## 2017-01-03 DIAGNOSIS — Z5112 Encounter for antineoplastic immunotherapy: Secondary | ICD-10-CM

## 2017-01-03 DIAGNOSIS — E8581 Light chain (AL) amyloidosis: Secondary | ICD-10-CM

## 2017-01-03 DIAGNOSIS — R21 Rash and other nonspecific skin eruption: Secondary | ICD-10-CM

## 2017-01-03 DIAGNOSIS — L270 Generalized skin eruption due to drugs and medicaments taken internally: Secondary | ICD-10-CM

## 2017-01-03 LAB — CBC & DIFF AND RETIC
BASO%: 0.1 % (ref 0.0–2.0)
BASOS ABS: 0 10*3/uL (ref 0.0–0.1)
EOS ABS: 0 10*3/uL (ref 0.0–0.5)
EOS%: 0 % (ref 0.0–7.0)
HEMATOCRIT: 32.2 % — AB (ref 38.4–49.9)
HEMOGLOBIN: 11 g/dL — AB (ref 13.0–17.1)
IMMATURE RETIC FRACT: 10.6 % (ref 3.00–10.60)
LYMPH%: 15.6 % (ref 14.0–49.0)
MCH: 33.2 pg (ref 27.2–33.4)
MCHC: 34.2 g/dL (ref 32.0–36.0)
MCV: 97.3 fL (ref 79.3–98.0)
MONO#: 1.4 10*3/uL — AB (ref 0.1–0.9)
MONO%: 17.6 % — AB (ref 0.0–14.0)
NEUT%: 66.7 % (ref 39.0–75.0)
NEUTROS ABS: 5.1 10*3/uL (ref 1.5–6.5)
PLATELETS: 262 10*3/uL (ref 140–400)
RBC: 3.31 10*6/uL — ABNORMAL LOW (ref 4.20–5.82)
RDW: 14.7 % — ABNORMAL HIGH (ref 11.0–14.6)
Retic %: 2.84 % — ABNORMAL HIGH (ref 0.80–1.80)
Retic Ct Abs: 94 10*3/uL — ABNORMAL HIGH (ref 34.80–93.90)
WBC: 7.7 10*3/uL (ref 4.0–10.3)
lymph#: 1.2 10*3/uL (ref 0.9–3.3)

## 2017-01-03 LAB — COMPREHENSIVE METABOLIC PANEL
ALBUMIN: 2.4 g/dL — AB (ref 3.5–5.0)
ALT: 33 U/L (ref 0–55)
AST: 39 U/L — ABNORMAL HIGH (ref 5–34)
Alkaline Phosphatase: 79 U/L (ref 40–150)
Anion Gap: 8 mEq/L (ref 3–11)
BILIRUBIN TOTAL: 0.38 mg/dL (ref 0.20–1.20)
BUN: 22 mg/dL (ref 7.0–26.0)
CALCIUM: 8.2 mg/dL — AB (ref 8.4–10.4)
CO2: 26 meq/L (ref 22–29)
CREATININE: 0.8 mg/dL (ref 0.7–1.3)
Chloride: 103 mEq/L (ref 98–109)
EGFR: >60 mL/min/{1.73_m2} (ref >60)
GLUCOSE: 89 mg/dL (ref 70–140)
Potassium: 3.8 mEq/L (ref 3.5–5.1)
SODIUM: 137 meq/L (ref 136–145)
TOTAL PROTEIN: 4.7 g/dL — AB (ref 6.4–8.3)

## 2017-01-03 MED ORDER — DEXAMETHASONE 4 MG PO TABS
ORAL_TABLET | ORAL | Status: AC
  Filled 2017-01-03: qty 5

## 2017-01-03 MED ORDER — DEXAMETHASONE 4 MG PO TABS
20.0000 mg | ORAL_TABLET | Freq: Once | ORAL | Status: AC
  Administered 2017-01-03: 20 mg via ORAL

## 2017-01-03 MED ORDER — PROCHLORPERAZINE MALEATE 10 MG PO TABS
10.0000 mg | ORAL_TABLET | Freq: Once | ORAL | Status: AC
  Administered 2017-01-03: 10 mg via ORAL

## 2017-01-03 MED ORDER — PROCHLORPERAZINE MALEATE 10 MG PO TABS
ORAL_TABLET | ORAL | Status: AC
  Filled 2017-01-03: qty 1

## 2017-01-03 MED ORDER — BORTEZOMIB CHEMO SQ INJECTION 3.5 MG (2.5MG/ML)
1.5000 mg/m2 | Freq: Once | INTRAMUSCULAR | Status: AC
  Administered 2017-01-03: 3 mg via SUBCUTANEOUS
  Filled 2017-01-03: qty 3

## 2017-01-03 NOTE — Patient Instructions (Signed)
[facility] Discharge Instructions for Patients Receiving Chemotherapy  Today you received the following chemotherapy agent :  Velcade.  To help prevent nausea and vomiting after your treatment, we encourage you to take your nausea medication as prescribed.   If you develop nausea and vomiting that is not controlled by your nausea medication, call the clinic.   BELOW ARE SYMPTOMS THAT SHOULD BE REPORTED IMMEDIATELY:  *FEVER GREATER THAN 100.5 F  *CHILLS WITH OR WITHOUT FEVER  NAUSEA AND VOMITING THAT IS NOT CONTROLLED WITH YOUR NAUSEA MEDICATION  *UNUSUAL SHORTNESS OF BREATH  *UNUSUAL BRUISING OR BLEEDING  TENDERNESS IN MOUTH AND THROAT WITH OR WITHOUT PRESENCE OF ULCERS  *URINARY PROBLEMS  *BOWEL PROBLEMS  UNUSUAL RASH Items with * indicate a potential emergency and should be followed up as soon as possible.  Feel free to call the clinic should you have any questions or concerns. The clinic phone number is [phone].  Please show the CHEMO ALERT CARD at check-in to the Emergency Department and triage nurse.   

## 2017-01-03 NOTE — Telephone Encounter (Signed)
Gave avs and calendar for November and December  °

## 2017-01-03 NOTE — Progress Notes (Signed)
Appt added in B section for decadron and velcade only per Dr. Irene Limbo. Confirmed chair placement with Tim, Agricultural consultant. Orders adjusted by Burman Nieves [facility] based on physician verbal order. Changes communicated to pt and given pager to be located by infusion room nurse.

## 2017-01-04 LAB — KAPPA/LAMBDA LIGHT CHAINS
IG KAPPA FREE LIGHT CHAIN: 9.2 mg/L (ref 3.3–19.4)
IG LAMBDA FREE LIGHT CHAIN: 49.7 mg/L — AB (ref 5.7–26.3)
Kappa/Lambda FluidC Ratio: 0.19 — ABNORMAL LOW (ref 0.26–1.65)

## 2017-01-06 LAB — MULTIPLE MYELOMA PANEL, SERUM
ALPHA 1: 0.2 g/dL (ref 0.0–0.4)
ALPHA2 GLOB SERPL ELPH-MCNC: 0.8 g/dL (ref 0.4–1.0)
Albumin SerPl Elph-Mcnc: 2.3 g/dL — ABNORMAL LOW (ref 2.9–4.4)
Albumin/Glob SerPl: 1.2 (ref 0.7–1.7)
B-Globulin SerPl Elph-Mcnc: 0.7 g/dL (ref 0.7–1.3)
Gamma Glob SerPl Elph-Mcnc: 0.4 g/dL (ref 0.4–1.8)
Globulin, Total: 2 g/dL — ABNORMAL LOW (ref 2.2–3.9)
IGG (IMMUNOGLOBIN G), SERUM: 439 mg/dL — AB (ref 700–1600)
IGM (IMMUNOGLOBIN M), SRM: 34 mg/dL (ref 15–143)
IgA, Qn, Serum: 47 mg/dL — ABNORMAL LOW (ref 61–437)
M Protein SerPl Elph-Mcnc: 0.1 g/dL — ABNORMAL HIGH
TOTAL PROTEIN: 4.3 g/dL — AB (ref 6.0–8.5)

## 2017-01-09 ENCOUNTER — Ambulatory Visit: Payer: Medicare Other | Admitting: Hematology

## 2017-01-09 ENCOUNTER — Other Ambulatory Visit: Payer: Medicare Other

## 2017-01-09 NOTE — Progress Notes (Signed)
[name]    HEMATOLOGY/ONCOLOGY FOLLOW UP NOTE  Date of Service: 01/10/17   Patient Care Team: Unk Pinto, MD as PCP - General (Internal Medicine) Inda Castle, MD as Consulting Physician (Gastroenterology) Rexene Agent, MD as Attending Physician (Nephrology) Pearson Grippe MD -nephrology   CHIEF COMPLAINTS/PURPOSE OF CONSULTATION:  Renal AL Amyloidosis  HISTORY OF PRESENTING ILLNESS:  Gregory Galvan is a wonderful 76 y.o. male who has been referred to [place] by Dr .Unk Pinto, MD /Dr Pearson Grippe MD for evaluation and management of newly diagnosed Renal AL Amyloidosis.  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   He recently developed lower extremity swelling and increasing proteinuria and was referred to nephrology and was seen by Dr. Pearson Grippe at [facility].   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  SPEP showed a small M spike of 0.2 g/dL IFE showing IgG lambda monoclonal paraproteinemia. Increase in serum lambda light chain with a decreased kappa/Lambda light chain ratio.  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 [place] red stain positive.  Patient was referred to [place] for further evaluation and management of his newly diagnosed AL Amyloidosis .  Patient notes his leg swelling is better with diuretics.  Notes no fever no chills no night sweats no unexpected weight loss. No focal bone pains. Labs have not shown any overt anemia or hypercalcemia. His creatinine is relatively normal at 1. Hypoalbuminemia with albumin level of 2.7 g/dL.  INTERVAL HISTORY  Patient presents to the clinic today for follow up of his renal AL amyloidosis and for his next weekly cycle of  treatment. He is also here for evaluation of skin rash improvement. He is unaccompanied. He states he is doing well overall. His skin rash has improved today and he states he has been moisturizing every night. Will hold his cyclophosphamide for 1 more week until rash completely resolves. He reports prednisone made him hyper and quite chatty. 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  Notes some decrease in leg swelling.  On review of systems, pt reports weight loss, intermittent pain in the left hip and down his leg and denies fever, chills, night sweats and any other accompanying symptoms.    MEDICAL HISTORY:  Past Medical History:  Diagnosis Date  . BPH (benign prostatic hyperplasia)   . Hyperlipidemia   . Hypertension   . Hypogonadism, male   . Pre-diabetes   . Prostatitis   . 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 No surgical procedure performed. History of hypertension for 30 years on ACE inhibitor, beta blocker and Lasix Hypothyroidism   SURGICAL HISTORY: Past Surgical History:  Procedure Laterality Date  . APPENDECTOMY      SOCIAL HISTORY: Social History   Socioeconomic History  . Marital status: Divorced    Spouse name: Not on file  . Number of children: Not on file  . Years of education: Not on file  . Highest education level: Not on file  Social Needs  . Financial resource strain: Not on file  . Food insecurity - worry: Not on file  . Food insecurity - inability: Not on file  .  Transportation needs - medical: Not on file  . Transportation needs - non-medical: Not on file  Occupational History  . Not on file  Tobacco Use  . Smoking status: Former Smoker    Last attempt to quit: 12/22/2013    Years since quitting: 3.0  . Smokeless tobacco: Never Used  Substance and Sexual Activity  . Alcohol use: Yes    Alcohol/week: 16.8 oz    Types: 28 Standard drinks or equivalent  per week    Comment: drinks wine  . Drug use: No  . Sexual activity: Not on file  Other Topics Concern  . Not on file  Social History Narrative  . Not on file    FAMILY HISTORY: Family History  Problem Relation Age of Onset  . Cancer Mother        breast  . Alzheimer's disease Mother   . Hypertension Father   . Heart disease Father   . Lymphoma Father   . Hypertension Brother   . Heart disease Brother   . Heart disease Brother   . Hypertension Brother   . Diabetes Brother     ALLERGIES:  is allergic to ppd [tuberculin purified protein derivative]; sulfonamide derivatives; and penicillins.  MEDICATIONS:  Current Outpatient Medications  Medication Sig Dispense Refill  . acyclovir (ZOVIRAX) 200 MG capsule Take 2 capsules (400 mg total) daily by mouth. This replaces your acyclovir tabs due to allergy concern 60 capsule 1  . ALPRAZolam (XANAX) 0.5 MG tablet take 1/2 to 1 tablet by mouth at bedtime if needed 90 tablet 1  . dexamethasone (DECADRON) 4 MG tablet Take 2 tablets (8 mg total) by mouth daily. Start the day after chemotherapy for 2 days. 30 tablet 1  . ezetimibe (ZETIA) 10 MG tablet Take 1 tablet daily for Cholesterol 90 tablet 1  . furosemide (LASIX) 40 MG tablet 1-2 pills a day for swelling (Patient taking differently: Take 80 mg by mouth daily. ) 60 tablet 11  . latanoprost (XALATAN) 0.005 % ophthalmic solution Place 1 drop into both eyes at bedtime.     [name] levothyroxine (SYNTHROID, LEVOTHROID) 175 MCG tablet take 1 and 1/2 tablets by mouth once daily or as directed 135 tablet 1  . milk thistle 175 MG tablet Take 175 mg by mouth daily.    . Multiple Vitamins-Minerals (MULTIVITAMIN WITH MINERALS) tablet Take 1 tablet by mouth daily.    . ondansetron (ZOFRAN) 8 MG tablet Take 1 tablet (8 mg total) by mouth 2 (two) times daily as needed for refractory nausea / vomiting. Start on day 3 after chemo. 30 tablet 1  . OVER THE COUNTER MEDICATION     . PARoxetine (PAXIL) 20 MG  tablet Take 10 mg by mouth daily.    . predniSONE (DELTASONE) 20 MG tablet 33m po daily with breakfast for 3 days then 431mpo daily x 4 days then prednisone 2073mo daily until reassessment in about 1 week 60 tablet 0  . prochlorperazine (COMPAZINE) 10 MG tablet Take 1 tablet (10 mg total) by mouth every 6 (six) hours as needed (Nausea or vomiting). 30 tablet 1  . Turmeric 500 MG CAPS Take 1,500 mg elemental calcium/kg/hr by mouth daily.     No current facility-administered medications for this visit.     REVIEW OF SYSTEMS:    10 Point review of Systems was done is negative except as noted above.  PHYSICAL EXAMINATION: ECOG PERFORMANCE STATUS: 1 - Symptomatic but completely ambulatory  . Vitals:   01/10/17  1401  BP: (!) 144/87  Pulse: 94  Resp: 17  Temp: 97.9 F (36.6 C)  SpO2: 100%   Filed Weights   01/10/17 1401  Weight: 175 lb 9.6 oz (79.7 kg)   .Body mass index is 26.7 kg/m.  GENERAL:alert, in no acute distress and comfortable SKIN: no acute rashes, no significant lesions EYES: conjunctiva are pink and non-injected, sclera anicteric OROPHARYNX: MMM, no exudates, no oropharyngeal erythema or ulceration NECK: supple, no JVD LYMPH:  no palpable lymphadenopathy in the cervical, axillary or inguinal regions LUNGS: clear to auscultation b/l with normal respiratory effort HEART: regular rate & rhythm ABDOMEN:  normoactive bowel sounds , non tender, not distended. No palpable hepatosplenomegaly Extremity: 2+ b/l pitting pedal edema PSYCH: alert & oriented x 3 with fluent speech NEURO: no focal motor/sensory deficits  LABORATORY DATA:  I have reviewed the data as listed  . CBC Latest Ref Rng & Units 01/10/2017 01/03/2017 12/27/2016  WBC 4.0 - 10.3 10e3/uL 7.4 7.7 3.4(L)  Hemoglobin 13.0 - 17.1 g/dL 13.4 11.0(L) 11.8(L)  Hematocrit 38.4 - 49.9 % 39.5 32.2(L) 34.6(L)  Platelets 140 - 400 10e3/uL 141 262 170    . CMP Latest Ref Rng & Units 01/10/2017 01/03/2017  01/03/2017  Glucose 70 - 140 mg/dl 85 89 -  BUN 7.0 - 26.0 mg/dL 23.8 22.0 -  Creatinine 0.7 - 1.3 mg/dL 0.9 0.8 -  Sodium 136 - 145 mEq/L 139 137 -  Potassium 3.5 - 5.1 mEq/L 3.5 3.8 -  Chloride 98 - 110 mmol/L - - -  CO2 22 - 29 mEq/L 27 26 -  Calcium 8.4 - 10.4 mg/dL 8.5 8.2(L) -  Total Protein 6.4 - 8.3 g/dL 5.1(L) 4.7(L) 4.3(L)  Total Bilirubin 0.20 - 1.20 mg/dL 0.49 0.38 -  Alkaline Phos 40 - 150 U/L 91 79 -  AST 5 - 34 U/L 50(H) 39(H) -  ALT 0 - 55 U/L 38 33 -   Component     Latest Ref Rng & Units 10/25/2016 11/22/2016  IgG (Immunoglobin G), Serum     700 - 1,600 mg/dL 496 (L) 308 (L)  IgA/Immunoglobulin A, Serum     61 - 437 mg/dL 76 51 (L)  IgM, Qn, Serum     15 - 143 mg/dL 22 16  Total Protein     6.0 - 8.5 g/dL 4.9 (L) 4.6 (L)  Albumin SerPl Elph-Mcnc     2.9 - 4.4 g/dL 2.6 (L) 2.3 (L)  Alpha 1     0.0 - 0.4 g/dL 0.2 0.2  Alpha2 Glob SerPl Elph-Mcnc     0.4 - 1.0 g/dL 0.9 0.9  B-Globulin SerPl Elph-Mcnc     0.7 - 1.3 g/dL 0.7 0.8  Gamma Glob SerPl Elph-Mcnc     0.4 - 1.8 g/dL 0.4 0.3 (L)  M Protein SerPl Elph-Mcnc     Not Observed g/dL 0.2 (H) 0.1 (H)  Globulin, Total     2.2 - 3.9 g/dL 2.3 2.3  Albumin/Glob SerPl     0.7 - 1.7 1.2 1.1  IFE 1      Comment Comment  Please Note (HCV):      Comment Comment  Ig Kappa Free Light Chain     3.3 - 19.4 mg/L 9.5 7.4  Ig Lambda Free Light Chain     5.7 - 26.3 mg/L 120.9 (H) 66.7 (H)  Kappa/Lambda FluidC Ratio     0.26 - 1.65 0.08 (L) 0.11 (L)  Component     Latest Ref Rng & Units 08/22/2016 10/07/2016  C3 Complement     82 - 185 mg/dL 113   C4 Complement     15 - 53 mg/dL 32   Anit Nuclear Antibody(ANA)     NEGATIVE NEG   RPR     NON REAC NON REAC   Angiotensin-Converting Enzyme     9 - 67 U/L 17   ds DNA Ab     IU/mL 1   ANCA SCREEN      Negative   Hep C Virus Ab     0.0 - 0.9 s/co ratio  0.1   Component     Latest Ref Rng & Units 10/07/2016  Hep C Virus Ab     0.0 - 0.9 s/co  ratio 0.1  Hepatitis B Surface Ag     Negative Negative  HIV Screen 4th Generation wRfx     Non Reactive Non Reactive                                   *[place]*                  *[address] [employer] [address].                        [place], [place] [address]                            [phone]  ------------------------------------------------------------------- Transthoracic Echocardiography  Patient:    Gregory Galvan, Gregory Galvan MR #:       [id number] Study Date: 10/12/2016 Gender:     M Age:        69 Height:     172.7 cm Weight:     77.7 kg BSA:        1.94 m^2 Pt. Status: Room:   SONOGRAPHER  Dustin Flock, RCS  PERFORMING   [facility], Outpatient  ATTENDING    [place], [place], [place] K[place]  REFERRING    [place], [place] Kishore  cc:  ------------------------------------------------------------------- LV EF: 65% -   70%  ------------------------------------------------------------------- Indications:      Pre-op evaluation V728.1.  ------------------------------------------------------------------- History:   PMH:  Renal Amyloidosis, Hypertension, pre-chemo.  ------------------------------------------------------------------- Study Conclusions  - Left ventricle: The cavity size was normal. Wall thickness was   increased in a pattern of moderate LVH. Systolic function was   vigorous. The estimated ejection fraction was in the range of 65%   to 70%. Wall motion was normal; there were no regional wall   motion abnormalities. Doppler parameters are consistent with   abnormal left ventricular relaxation (grade 1 diastolic   dysfunction). - Mitral valve: There was trivial regurgitation. - Left atrium: The atrium was mildly dilated.    RADIOGRAPHIC STUDIES:  I have personally reviewed the radiological images as listed and agreed with the findings in the report. No results  found.  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  -No  hypercalcemia, normal creatinine level, no anemia. -No evidence of bone lesions on whole body skeletal survey. BM Bx- no evidence of myeloma  Echo shows normal ejection fraction with grade 1 diastolic dysfunction.  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  Hepatitis C, HIV and hepatitis B negative. Other workup as per nephrologist negative. Patient follows with Dr. Pearson Grippe  [facility].  #3 Grade 2 macular/desquamative rash over trunk and extremities with no evidence of mucosal involvement. Now significantly improved and continues to improve PLAN -labs stable today. His recent SPEP and SFLC show response to treatment with improvement in M spike from 0.2 to 0.1g/dl and Seru free Lambda light chains from 120 to 66.7 to 49.7 24h UPEP total protein has decreased from 4.22 to 2.9 g per 24 hours. -Continue cetirizine/Zyrtec 10 mg p.o. Daily -May use additional Benadryl 25 mg p.o. daily every 8 hours as needed for rash and itching. -Continue famotidine/Pepcid 20 mg p.o. twice daily. -stop prednisone --We will recommend holding all your supplements at this time. -Use only lukewarm short showers. -was advised to keep skin well moisturized. -Use nonmedicated nondeodorant containing mild soaps. -Call if your rash gets significantly worse. -continue Vd only -- continuing to hold cycolpphosphamide -will restart if rash continues to ipmrove -discontinue Zetia (was a new medication)  #3 Leg swelling - due to hypoalbuminemia from his nephrotic syndrome + Steroids Plan -continue diuretics as per nephrologist. Currently taking lasix 53m po daily. -. I also urged him to only consume 48-64oz daily to reduce that amount of  fluid he retains.  -minimize salt intake - compression socks given -elevated legs as much as possible. - Swelling much improved today   Continue weekly treatment as per orders RTC with Dr KIrene Limboin 2 weeks   No orders of the defined types were placed in this encounter.  All of the patients questions were answered with apparent satisfaction. The patient knows to call the clinic with any problems, questions or concerns.  I spent 20 minutes counseling the patient face to face. The total time spent in the appointment was 25 minutes and more than 50% was on counseling and direct patient cares.  GSullivan LoneMD MRoseboroAAHIVMS SGreenspring Surgery CenterCBaptist Medical Center - PrincetonHematology/Oncology Physician CMorrill County Community [facility] (Office):       [phone](Work cell):  [phone](Fax):           [phone] This document serves as a record of services personally performed by GSullivan Lone MD. It was created on his behalf by LAlean Rinne a trained medical scribe. The creation of this record is based on the scribe's personal observations and the provider's statements to them.   .I have reviewed the above documentation for accuracy and completeness, and I agree with the above. .Brunetta GeneraMD MS

## 2017-01-10 ENCOUNTER — Telehealth: Payer: Self-pay | Admitting: Hematology

## 2017-01-10 ENCOUNTER — Ambulatory Visit (HOSPITAL_BASED_OUTPATIENT_CLINIC_OR_DEPARTMENT_OTHER): Payer: Medicare Other

## 2017-01-10 ENCOUNTER — Other Ambulatory Visit (HOSPITAL_BASED_OUTPATIENT_CLINIC_OR_DEPARTMENT_OTHER): Payer: Medicare Other

## 2017-01-10 ENCOUNTER — Ambulatory Visit (HOSPITAL_BASED_OUTPATIENT_CLINIC_OR_DEPARTMENT_OTHER): Payer: Medicare Other | Admitting: Hematology

## 2017-01-10 ENCOUNTER — Encounter: Payer: Self-pay | Admitting: Hematology

## 2017-01-10 DIAGNOSIS — R21 Rash and other nonspecific skin eruption: Secondary | ICD-10-CM | POA: Diagnosis not present

## 2017-01-10 DIAGNOSIS — E8581 Light chain (AL) amyloidosis: Secondary | ICD-10-CM | POA: Diagnosis not present

## 2017-01-10 DIAGNOSIS — Z5112 Encounter for antineoplastic immunotherapy: Secondary | ICD-10-CM

## 2017-01-10 DIAGNOSIS — N049 Nephrotic syndrome with unspecified morphologic changes: Secondary | ICD-10-CM

## 2017-01-10 DIAGNOSIS — M7989 Other specified soft tissue disorders: Secondary | ICD-10-CM | POA: Diagnosis not present

## 2017-01-10 DIAGNOSIS — E8809 Other disorders of plasma-protein metabolism, not elsewhere classified: Secondary | ICD-10-CM

## 2017-01-10 LAB — CBC & DIFF AND RETIC
BASO%: 0.1 % (ref 0.0–2.0)
Basophils Absolute: 0 10*3/uL (ref 0.0–0.1)
EOS%: 1.5 % (ref 0.0–7.0)
Eosinophils Absolute: 0.1 10*3/uL (ref 0.0–0.5)
HCT: 39.5 % (ref 38.4–49.9)
HGB: 13.4 g/dL (ref 13.0–17.1)
Immature Retic Fract: 5.5 % (ref 3.00–10.60)
LYMPH%: 14.5 % (ref 14.0–49.0)
MCH: 33.3 pg (ref 27.2–33.4)
MCHC: 33.9 g/dL (ref 32.0–36.0)
MCV: 98.3 fL — AB (ref 79.3–98.0)
MONO#: 1.1 10*3/uL — AB (ref 0.1–0.9)
MONO%: 15.3 % — AB (ref 0.0–14.0)
NEUT%: 68.6 % (ref 39.0–75.0)
NEUTROS ABS: 5.1 10*3/uL (ref 1.5–6.5)
PLATELETS: 141 10*3/uL (ref 140–400)
RBC: 4.02 10*6/uL — ABNORMAL LOW (ref 4.20–5.82)
RDW: 15.1 % — AB (ref 11.0–14.6)
RETIC CT ABS: 113.77 10*3/uL — AB (ref 34.80–93.90)
Retic %: 2.83 % — ABNORMAL HIGH (ref 0.80–1.80)
WBC: 7.4 10*3/uL (ref 4.0–10.3)
lymph#: 1.1 10*3/uL (ref 0.9–3.3)
nRBC: 0 % (ref 0–0)

## 2017-01-10 LAB — COMPREHENSIVE METABOLIC PANEL
ALT: 38 U/L (ref 0–55)
ANION GAP: 9 meq/L (ref 3–11)
AST: 50 U/L — ABNORMAL HIGH (ref 5–34)
Albumin: 2.5 g/dL — ABNORMAL LOW (ref 3.5–5.0)
Alkaline Phosphatase: 91 U/L (ref 40–150)
BUN: 23.8 mg/dL (ref 7.0–26.0)
CHLORIDE: 103 meq/L (ref 98–109)
CO2: 27 meq/L (ref 22–29)
Calcium: 8.5 mg/dL (ref 8.4–10.4)
Creatinine: 0.9 mg/dL (ref 0.7–1.3)
Glucose: 85 mg/dl (ref 70–140)
Potassium: 3.5 mEq/L (ref 3.5–5.1)
Sodium: 139 mEq/L (ref 136–145)
Total Bilirubin: 0.49 mg/dL (ref 0.20–1.20)
Total Protein: 5.1 g/dL — ABNORMAL LOW (ref 6.4–8.3)

## 2017-01-10 MED ORDER — SODIUM CHLORIDE 0.9 % IV SOLN: 20.0000 mg | Freq: Once | INTRAVENOUS | Status: DC

## 2017-01-10 MED ORDER — DEXAMETHASONE 4 MG PO TABS
20.0000 mg | ORAL_TABLET | Freq: Once | ORAL | Status: AC
  Administered 2017-01-10: 20 mg via ORAL

## 2017-01-10 MED ORDER — DEXAMETHASONE 4 MG PO TABS: 8.0000 mg | ORAL_TABLET | Freq: Every day | ORAL | 1 refills | Status: DC

## 2017-01-10 MED ORDER — PROCHLORPERAZINE MALEATE 10 MG PO TABS
ORAL_TABLET | ORAL | Status: AC
  Filled 2017-01-10: qty 1

## 2017-01-10 MED ORDER — PROCHLORPERAZINE MALEATE 10 MG PO TABS
10.0000 mg | ORAL_TABLET | Freq: Once | ORAL | Status: AC
  Administered 2017-01-10: 10 mg via ORAL

## 2017-01-10 MED ORDER — DEXAMETHASONE 4 MG PO TABS
ORAL_TABLET | ORAL | Status: AC
  Filled 2017-01-10: qty 5

## 2017-01-10 MED ORDER — BORTEZOMIB CHEMO SQ INJECTION 3.5 MG (2.5MG/ML)
1.5000 mg/m2 | Freq: Once | INTRAMUSCULAR | Status: AC
  Administered 2017-01-10: 3 mg via SUBCUTANEOUS
  Filled 2017-01-10: qty 3

## 2017-01-10 NOTE — Patient Instructions (Signed)
Thank you for choosing [facility] to provide your oncology and hematology care.  To afford each patient quality time with our providers, please arrive 30 minutes before your scheduled appointment time.  If you arrive late for your appointment, you may be asked to reschedule.  We strive to give you quality time with our providers, and arriving late affects you and other patients whose appointments are after yours.   If you are a no show for multiple scheduled visits, you may be dismissed from the clinic at the providers discretion.    Again, thank you for choosing [facility], our hope is that these requests will decrease the amount of time that you wait before being seen by our physicians.  ______________________________________________________________________  Should you have questions after your visit to the [facility], please contact our office at [phone] between the hours of 8:30 and 4:30 p.m.    Voicemails left after 4:30p.m will not be returned until the following business day.    For prescription refill requests, please have your pharmacy contact us directly.  Please also try to allow 48 hours for prescription requests.    Please contact the scheduling department for questions regarding scheduling.  For scheduling of procedures such as PET scans, CT scans, MRI, Ultrasound, etc please contact central scheduling at [phone].    Resources For Cancer Patients and Caregivers:   Oncolink.org:  A wonderful resource for patients and healthcare providers for information regarding your disease, ways to tract your treatment, what to expect, etc.     American Cancer Society:  800-227-2345  Can help patients locate various types of support and financial assistance  Cancer Care: 1-800-813-HOPE (4673) Provides financial assistance, online support groups, medication/co-pay assistance.    [place] DSS:  336-641-3447 Where to apply for food  stamps, Medicaid, and utility assistance  Medicare Rights Center: 800-333-4114 Helps people with Medicare understand their rights and benefits, navigate the Medicare system, and secure the quality healthcare they deserve  SCAT: 336-333-6589 [place] [employer]'s shared-ride transportation service for eligible riders who have a disability that prevents them from riding the fixed route bus.    For additional information on assistance programs please contact our social worker:   Grier Hock/Abigail Elmore:  [phone]            

## 2017-01-10 NOTE — Telephone Encounter (Signed)
Scheduled appt per 11/27 los - Gave patient AVS and calender per los.  

## 2017-01-10 NOTE — Patient Instructions (Signed)
[facility] Discharge Instructions for Patients Receiving Chemotherapy  Today you received the following chemotherapy agent :  Velcade.  To help prevent nausea and vomiting after your treatment, we encourage you to take your nausea medication as prescribed.   If you develop nausea and vomiting that is not controlled by your nausea medication, call the clinic.   BELOW ARE SYMPTOMS THAT SHOULD BE REPORTED IMMEDIATELY:  *FEVER GREATER THAN 100.5 F  *CHILLS WITH OR WITHOUT FEVER  NAUSEA AND VOMITING THAT IS NOT CONTROLLED WITH YOUR NAUSEA MEDICATION  *UNUSUAL SHORTNESS OF BREATH  *UNUSUAL BRUISING OR BLEEDING  TENDERNESS IN MOUTH AND THROAT WITH OR WITHOUT PRESENCE OF ULCERS  *URINARY PROBLEMS  *BOWEL PROBLEMS  UNUSUAL RASH Items with * indicate a potential emergency and should be followed up as soon as possible.  Feel free to call the clinic should you have any questions or concerns. The clinic phone number is [phone].  Please show the CHEMO ALERT CARD at check-in to the Emergency Department and triage nurse.   

## 2017-01-12 ENCOUNTER — Telehealth: Payer: Self-pay | Admitting: *Deleted

## 2017-01-12 NOTE — Telephone Encounter (Signed)
Pt called stating he has been thinking about what may have caused recent rash.  Pt reports he started new medication Zetia back in October.  Rx in Epic shows drug was prescribed 11/19/16.  Infusion note from 12/20/16 states "pt reports skin has been itching for about a month".  Reviewed with Dr. Irene Limbo.  Will plan to hold Zetia for now to see if rash improves.  Instructed pt to stop Zetia until told otherwise.  Pt verbalized understanding.

## 2017-01-16 ENCOUNTER — Telehealth: Payer: Self-pay

## 2017-01-16 ENCOUNTER — Ambulatory Visit (HOSPITAL_BASED_OUTPATIENT_CLINIC_OR_DEPARTMENT_OTHER): Payer: Medicare Other | Admitting: Medical

## 2017-01-16 VITALS — BP 130/84 | HR 92 | Temp 98.1°F | Resp 18 | Ht 68.0 in | Wt 175.3 lb

## 2017-01-16 DIAGNOSIS — E8581 Light chain (AL) amyloidosis: Secondary | ICD-10-CM

## 2017-01-16 DIAGNOSIS — R21 Rash and other nonspecific skin eruption: Secondary | ICD-10-CM

## 2017-01-16 MED ORDER — METHYLPREDNISOLONE 4 MG PO TABS: ORAL_TABLET | ORAL | 0 refills | Status: DC

## 2017-01-16 NOTE — Progress Notes (Signed)
[facility] Progress Note   Gregory Galvan [id number] Jan 02, 1941 76 y.o.  Gregory Galvan is managed by Dr. Sullivan Lone  Actively treated with chemotherapy: yes  Current Therapy: Velcade  Last Treated: 01/10/2017  Assessment: Plan:    Rash - Plan: methylPREDNISolone (MEDROL) 4 MG tablet   Rash of the back, chest, arms, and scalp: Patient was given a Medrol taper.  He was told to begin using oatmeal soap and oatmeal lotion.  He was instructed to continue Benadryl, Pepcid, and Zyrtec if needed.  Please see After Visit Summary for patient specific instructions.  Future Appointments  Date Time Provider [place]  01/17/2017  3:15 PM [facility] H30 [facility] None  01/24/2017  2:15 PM [facility] LAB 1 [facility] None  01/24/2017  2:40 PM Brunetta Genera, MD [facility] None  01/24/2017  3:45 PM [facility] B5 [facility] None  01/31/2017  2:15 PM [facility] LAB 1 [facility] None  01/31/2017  3:00 PM [facility] H31 [facility] None  02/08/2017  2:30 PM [facility] LAB 3 [facility] None  02/08/2017  3:15 PM [facility] A2 [facility] None  02/15/2017  2:30 PM [facility] LAB 6 [facility] None  02/15/2017  3:45 PM [facility] A1 [facility] None  02/28/2017  2:30 PM Liane Comber, NP [facility] None  06/06/2017  2:30 PM Unk Pinto, MD [facility] None  12/21/2017 10:00 AM Unk Pinto, MD [facility] None    No orders of the defined types were placed in this encounter.      Subjective:   Patient ID:  Gregory Galvan is a 76 y.o. (DOB [birth year]) male.  Chief Complaint:  Chief Complaint  Patient presents with  . Rash    HPI Gregory Galvan is a 76 year old male with a diagnosis of renal amyloidosis.  He is treated with Velcade which was last dose on 01/10/2017.  He presents the office today with a diffuse erythematous and pruritic rash over his chest, back, arms, and scalp.  He was previously treated with prednisone but had  blurring of his vision and decreased hearing acuity.  He has been taking Pepcid, Zyrtec, and Benadryl without relief of his symptoms.  Dr. Lupita Shutter note indicates that his rash could be associated with an over-the-counter supplement or coating on his acyclovir.  He continues to use over-the-counter lotions without relief of his symptoms.  The patient denies fevers, chills, sweats, oral tenderness, or difficulty swallowing.  Medications: I have reviewed the patient's current medications.  Allergies:  Allergies  Allergen Reactions  . 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 Sulfonamide Derivatives Itching and Swelling  . Penicillins Swelling and Rash    Past Medical History:  Diagnosis Date  . BPH (benign prostatic hyperplasia)   . Hyperlipidemia   . Hypertension   . Hypogonadism, male   . Pre-diabetes   . Prostatitis   . Thyroid disease     Past Surgical History:  Procedure Laterality Date  . APPENDECTOMY      Family History  Problem Relation Age of Onset  . Cancer Mother        breast  . Alzheimer's disease Mother   . Hypertension Father   . Heart disease Father   . Lymphoma Father   . Hypertension Brother   . Heart disease Brother   . Heart disease Brother   . Hypertension Brother   . Diabetes Brother     Social History   Socioeconomic History  . Marital status: Divorced  Spouse name: Not on file  . Number of children: Not on file  . Years of education: Not on file  . Highest education level: Not on file  Social Needs  . Financial resource strain: Not on file  . Food insecurity - worry: Not on file  . Food insecurity - inability: Not on file  . Transportation needs - medical: Not on file  . Transportation needs - non-medical: Not on file  Occupational History  . Not on file  Tobacco Use  . Smoking status: Former Smoker    Last attempt to quit: 12/22/2013    Years since quitting: 3.0  . Smokeless tobacco:  Never Used  Substance and Sexual Activity  . Alcohol use: Yes    Alcohol/week: 16.8 oz    Types: 28 Standard drinks or equivalent per week    Comment: drinks wine  . Drug use: No  . Sexual activity: Not on file  Other Topics Concern  . Not on file  Social History Narrative  . Not on file    Past Medical History, Surgical history, Social history, and Family history were reviewed and updated as appropriate.   Please see review of systems for further details on the patient's review from today.   Review of Systems:  Review of Systems  Constitutional: Negative for chills, diaphoresis, fatigue and fever.  HENT: Negative for facial swelling and trouble swallowing.   Respiratory: Negative for cough, choking, chest tightness and shortness of breath.   Cardiovascular: Negative for chest pain and leg swelling.  Skin: Positive for rash.    Objective:   Physical Exam:  BP 130/84 (BP Location: Left Arm, Patient Position: Sitting)   Pulse 92   Temp 98.1 F (36.7 C) (Oral)   Resp 18   Ht 5\' 8"  (1.727 m)   Wt 175 lb 4.8 oz (79.5 kg)   SpO2 98%   BMI 26.65 kg/m  ECOG: 0  Physical Exam  Constitutional: No distress.  HENT:  Head: Normocephalic and atraumatic.  Right Ear: External ear normal.  Left Ear: External ear normal.  Mouth/Throat: Oropharynx is clear and moist. No oropharyngeal exudate.  Eyes: Right eye exhibits no discharge. Left eye exhibits no discharge. No scleral icterus.  Neck: Normal range of motion. Neck supple.  Cardiovascular: Normal rate, regular rhythm and normal heart sounds. Exam reveals no gallop and no friction rub.  No murmur heard. Pulmonary/Chest: Effort normal and breath sounds normal. No respiratory distress. He has no wheezes. He has no rales.  Musculoskeletal: He exhibits no edema.  Lymphadenopathy:    He has no cervical adenopathy.  Neurological: He is alert. Coordination normal.  Skin: Skin is warm and dry. Rash noted. He is not diaphoretic. There  is erythema.           Lab Review:     Component Value Date/Time   NA 136 01/17/2017 1351   K 4.0 01/17/2017 1351   CL 95 (L) 11/18/2016 1119   CO2 25 01/17/2017 1351   GLUCOSE 68 (L) 01/17/2017 1351   BUN 14.7 01/17/2017 1351   CREATININE 0.9 01/17/2017 1351   CALCIUM 8.0 (L) 01/17/2017 1351   PROT 5.0 (L) 01/17/2017 1351   ALBUMIN 2.5 (L) 01/17/2017 1351   AST 50 (H) 01/17/2017 1351   ALT 35 01/17/2017 1351   ALKPHOS 98 01/17/2017 1351   BILITOT 0.62 01/17/2017 1351   GFRNONAA 80 11/18/2016 1119   GFRAA 92 11/18/2016 1119       Component Value Date/Time  WBC 6.9 01/17/2017 1351   WBC 4.8 11/18/2016 1119   RBC 3.88 (L) 01/17/2017 1351   RBC 3.92 (L) 11/18/2016 1119   HGB 12.8 (L) 01/17/2017 1351   HCT 38.6 01/17/2017 1351   PLT 139 (L) 01/17/2017 1351   MCV 99.5 (H) 01/17/2017 1351   MCH 33.0 01/17/2017 1351   MCH 33.4 (H) 11/18/2016 1119   MCHC 33.2 01/17/2017 1351   MCHC 35.1 11/18/2016 1119   RDW 15.3 (H) 01/17/2017 1351   LYMPHSABS 1.5 01/17/2017 1351   MONOABS 0.5 01/17/2017 1351   EOSABS 0.7 (H) 01/17/2017 1351   BASOSABS 0.0 01/17/2017 1351   -------------------------------  Imaging from last 24 hours (if applicable):  Radiology interpretation: No results found.

## 2017-01-16 NOTE — Telephone Encounter (Signed)
Pt called this AM to explain that he is developing a skin rash. Read through providers note from last visit. Pt to be taking benadryl 25mg  every 8 hours as needed, pepcid 20mg  twice daily, and zyrtec 10mg  daily per MD note. Pt confirmed that he is taking pepcid and zyrtec as described, but has only taken benadryl before bed so far. Pt also questioned use of CeraVe skin lotion versus another alternative.  Spoke with Sandi Mealy, PA about the potential to see this patient today. He is currently receiving treatment of velcade for AL amyloidosis weekly and has experienced this symptom before, but much worse. Previously, rash had covered entire body, but today it is mostly chest/back/arms. Per pt, Dr. Irene Limbo said, "this could have been life-threatening" in relation to his past rash. Pt is now off of his previously prescribed steroid. Pt to be seen this afternoon at 1pm by Lucianne Lei, [place]. Confirmed time with patient and high priority message sent.

## 2017-01-17 ENCOUNTER — Other Ambulatory Visit (HOSPITAL_BASED_OUTPATIENT_CLINIC_OR_DEPARTMENT_OTHER): Payer: Medicare Other

## 2017-01-17 ENCOUNTER — Ambulatory Visit (HOSPITAL_BASED_OUTPATIENT_CLINIC_OR_DEPARTMENT_OTHER): Payer: Medicare Other

## 2017-01-17 VITALS — BP 127/74 | HR 97 | Temp 98.3°F | Resp 18

## 2017-01-17 DIAGNOSIS — Z5112 Encounter for antineoplastic immunotherapy: Secondary | ICD-10-CM | POA: Diagnosis not present

## 2017-01-17 DIAGNOSIS — E8581 Light chain (AL) amyloidosis: Secondary | ICD-10-CM | POA: Diagnosis not present

## 2017-01-17 DIAGNOSIS — Z5111 Encounter for antineoplastic chemotherapy: Secondary | ICD-10-CM | POA: Diagnosis not present

## 2017-01-17 LAB — COMPREHENSIVE METABOLIC PANEL
ALBUMIN: 2.5 g/dL — AB (ref 3.5–5.0)
ALK PHOS: 98 U/L (ref 40–150)
ALT: 35 U/L (ref 0–55)
AST: 50 U/L — AB (ref 5–34)
Anion Gap: 9 mEq/L (ref 3–11)
BUN: 14.7 mg/dL (ref 7.0–26.0)
CALCIUM: 8 mg/dL — AB (ref 8.4–10.4)
CO2: 25 mEq/L (ref 22–29)
CREATININE: 0.9 mg/dL (ref 0.7–1.3)
Chloride: 102 mEq/L (ref 98–109)
EGFR: >60 mL/min/{1.73_m2} (ref >60)
GLUCOSE: 68 mg/dL — AB (ref 70–140)
POTASSIUM: 4 meq/L (ref 3.5–5.1)
SODIUM: 136 meq/L (ref 136–145)
Total Bilirubin: 0.62 mg/dL (ref 0.20–1.20)
Total Protein: 5 g/dL — ABNORMAL LOW (ref 6.4–8.3)

## 2017-01-17 LAB — CBC & DIFF AND RETIC
BASO%: 0.4 % (ref 0.0–2.0)
Basophils Absolute: 0 10*3/uL (ref 0.0–0.1)
EOS ABS: 0.7 10*3/uL — AB (ref 0.0–0.5)
EOS%: 10 % — ABNORMAL HIGH (ref 0.0–7.0)
HCT: 38.6 % (ref 38.4–49.9)
HEMOGLOBIN: 12.8 g/dL — AB (ref 13.0–17.1)
IMMATURE RETIC FRACT: 4.3 % (ref 3.00–10.60)
LYMPH#: 1.5 10*3/uL (ref 0.9–3.3)
LYMPH%: 21.4 % (ref 14.0–49.0)
MCH: 33 pg (ref 27.2–33.4)
MCHC: 33.2 g/dL (ref 32.0–36.0)
MCV: 99.5 fL — ABNORMAL HIGH (ref 79.3–98.0)
MONO#: 0.5 10*3/uL (ref 0.1–0.9)
MONO%: 7.1 % (ref 0.0–14.0)
NEUT%: 61.1 % (ref 39.0–75.0)
NEUTROS ABS: 4.2 10*3/uL (ref 1.5–6.5)
Platelets: 139 10*3/uL — ABNORMAL LOW (ref 140–400)
RBC: 3.88 10*6/uL — AB (ref 4.20–5.82)
RDW: 15.3 % — AB (ref 11.0–14.6)
RETIC %: 2.12 % — AB (ref 0.80–1.80)
RETIC CT ABS: 82.26 10*3/uL (ref 34.80–93.90)
WBC: 6.9 10*3/uL (ref 4.0–10.3)

## 2017-01-17 MED ORDER — BORTEZOMIB CHEMO SQ INJECTION 3.5 MG (2.5MG/ML)
1.5000 mg/m2 | Freq: Once | INTRAMUSCULAR | Status: AC
  Administered 2017-01-17: 3 mg via SUBCUTANEOUS
  Filled 2017-01-17: qty 3

## 2017-01-17 MED ORDER — SODIUM CHLORIDE 0.9 % IV SOLN
Freq: Once | INTRAVENOUS | Status: AC
  Administered 2017-01-17: 16:00:00 via INTRAVENOUS

## 2017-01-17 MED ORDER — SODIUM CHLORIDE 0.9 % IV SOLN
300.0000 mg/m2 | Freq: Once | INTRAVENOUS | Status: AC
  Administered 2017-01-17: 580 mg via INTRAVENOUS
  Filled 2017-01-17: qty 29

## 2017-01-17 MED ORDER — SODIUM CHLORIDE 0.9 % IV SOLN
20.0000 mg | Freq: Once | INTRAVENOUS | Status: AC
  Administered 2017-01-17: 20 mg via INTRAVENOUS
  Filled 2017-01-17: qty 2

## 2017-01-17 MED ORDER — PALONOSETRON HCL INJECTION 0.25 MG/5ML
INTRAVENOUS | Status: AC
  Filled 2017-01-17: qty 5

## 2017-01-17 MED ORDER — PALONOSETRON HCL INJECTION 0.25 MG/5ML
0.2500 mg | Freq: Once | INTRAVENOUS | Status: AC
  Administered 2017-01-17: 0.25 mg via INTRAVENOUS

## 2017-01-17 NOTE — Patient Instructions (Signed)
[facility] Discharge Instructions for Patients Receiving Chemotherapy  Today you received the following chemotherapy agents Cytoxan,velcade To help prevent nausea and vomiting after your treatment, we encourage you to take your nausea medication as presc cribed.  If you develop nausea and vomiting that is not controlled by your nausea medication, call the clinic.   BELOW ARE SYMPTOMS THAT SHOULD BE REPORTED IMMEDIATELY:  *FEVER GREATER THAN 100.5 F  *CHILLS WITH OR WITHOUT FEVER  NAUSEA AND VOMITING THAT IS NOT CONTROLLED WITH YOUR NAUSEA MEDICATION  *UNUSUAL SHORTNESS OF BREATH  *UNUSUAL BRUISING OR BLEEDING  TENDERNESS IN MOUTH AND THROAT WITH OR WITHOUT PRESENCE OF ULCERS  *URINARY PROBLEMS  *BOWEL PROBLEMS  UNUSUAL RASH Items with * indicate a potential emergency and should be followed up as soon as possible.  Feel free to call the clinic should you have any questions or concerns. The clinic phone number is [phone].  Please show the [place] at check-in to the Emergency Department and triage nurse.  For the rash and itching  take  PEPCID 20 mg twice a day and ZYRTEC one tablet daily. You can get these at your local pharmacy.Stop  taking any over the counter supplements. Do not use hot water,use lukewarm water for bathing.

## 2017-01-17 NOTE — Progress Notes (Signed)
Patient was seen yesterday for rash. No reported worsening. Dr. Irene Limbo aware.  Wylene Simmer, BSN, RN 01/17/2017 3:10 PM

## 2017-01-20 ENCOUNTER — Telehealth: Payer: Self-pay

## 2017-01-20 NOTE — Telephone Encounter (Signed)
Pt called to confirm appt on 01/24/17.

## 2017-01-24 ENCOUNTER — Telehealth: Payer: Self-pay | Admitting: Hematology

## 2017-01-24 ENCOUNTER — Encounter: Payer: Self-pay | Admitting: Hematology

## 2017-01-24 ENCOUNTER — Ambulatory Visit: Payer: Medicare Other

## 2017-01-24 ENCOUNTER — Ambulatory Visit (HOSPITAL_BASED_OUTPATIENT_CLINIC_OR_DEPARTMENT_OTHER): Payer: Medicare Other | Admitting: Hematology

## 2017-01-24 ENCOUNTER — Other Ambulatory Visit (HOSPITAL_BASED_OUTPATIENT_CLINIC_OR_DEPARTMENT_OTHER): Payer: Medicare Other

## 2017-01-24 VITALS — BP 141/85 | HR 100 | Temp 97.7°F | Resp 20 | Ht 68.0 in | Wt 183.4 lb

## 2017-01-24 DIAGNOSIS — E8581 Light chain (AL) amyloidosis: Secondary | ICD-10-CM

## 2017-01-24 DIAGNOSIS — E8809 Other disorders of plasma-protein metabolism, not elsewhere classified: Secondary | ICD-10-CM

## 2017-01-24 DIAGNOSIS — N049 Nephrotic syndrome with unspecified morphologic changes: Secondary | ICD-10-CM

## 2017-01-24 DIAGNOSIS — M7989 Other specified soft tissue disorders: Secondary | ICD-10-CM

## 2017-01-24 DIAGNOSIS — L538 Other specified erythematous conditions: Secondary | ICD-10-CM | POA: Diagnosis not present

## 2017-01-24 DIAGNOSIS — L308 Other specified dermatitis: Secondary | ICD-10-CM

## 2017-01-24 LAB — COMPREHENSIVE METABOLIC PANEL
ALT: 39 U/L (ref 0–55)
ANION GAP: 11 meq/L (ref 3–11)
AST: 53 U/L — ABNORMAL HIGH (ref 5–34)
Albumin: 2.6 g/dL — ABNORMAL LOW (ref 3.5–5.0)
Alkaline Phosphatase: 93 U/L (ref 40–150)
BUN: 18.5 mg/dL (ref 7.0–26.0)
CHLORIDE: 100 meq/L (ref 98–109)
CO2: 24 meq/L (ref 22–29)
Calcium: 8.4 mg/dL (ref 8.4–10.4)
Creatinine: 0.9 mg/dL (ref 0.7–1.3)
GLUCOSE: 80 mg/dL (ref 70–140)
POTASSIUM: 4 meq/L (ref 3.5–5.1)
SODIUM: 136 meq/L (ref 136–145)
Total Bilirubin: 0.47 mg/dL (ref 0.20–1.20)
Total Protein: 5.2 g/dL — ABNORMAL LOW (ref 6.4–8.3)

## 2017-01-24 LAB — CBC & DIFF AND RETIC
BASO%: 0.3 % (ref 0.0–2.0)
BASOS ABS: 0 10*3/uL (ref 0.0–0.1)
EOS ABS: 0.8 10*3/uL — AB (ref 0.0–0.5)
EOS%: 13.6 % — ABNORMAL HIGH (ref 0.0–7.0)
HCT: 36.6 % — ABNORMAL LOW (ref 38.4–49.9)
HEMOGLOBIN: 12.3 g/dL — AB (ref 13.0–17.1)
IMMATURE RETIC FRACT: 6.5 % (ref 3.00–10.60)
LYMPH%: 12.2 % — AB (ref 14.0–49.0)
MCH: 33.7 pg — ABNORMAL HIGH (ref 27.2–33.4)
MCHC: 33.6 g/dL (ref 32.0–36.0)
MCV: 100.3 fL — ABNORMAL HIGH (ref 79.3–98.0)
MONO#: 0.3 10*3/uL (ref 0.1–0.9)
MONO%: 4.4 % (ref 0.0–14.0)
NEUT%: 69.5 % (ref 39.0–75.0)
NEUTROS ABS: 4 10*3/uL (ref 1.5–6.5)
Platelets: 193 10*3/uL (ref 140–400)
RBC: 3.65 10*6/uL — AB (ref 4.20–5.82)
RDW: 15.7 % — ABNORMAL HIGH (ref 11.0–14.6)
RETIC %: 2.61 % — AB (ref 0.80–1.80)
Retic Ct Abs: 95.27 10*3/uL — ABNORMAL HIGH (ref 34.80–93.90)
WBC: 5.7 10*3/uL (ref 4.0–10.3)
lymph#: 0.7 10*3/uL — ABNORMAL LOW (ref 0.9–3.3)

## 2017-01-24 MED ORDER — PREDNISONE 20 MG PO TABS: ORAL_TABLET | ORAL | 0 refills | Status: AC

## 2017-01-24 NOTE — Patient Instructions (Signed)
Thank you for choosing [facility] to provide your oncology and hematology care.  To afford each patient quality time with our providers, please arrive 30 minutes before your scheduled appointment time.  If you arrive late for your appointment, you may be asked to reschedule.  We strive to give you quality time with our providers, and arriving late affects you and other patients whose appointments are after yours.   If you are a no show for multiple scheduled visits, you may be dismissed from the clinic at the providers discretion.    Again, thank you for choosing [facility], our hope is that these requests will decrease the amount of time that you wait before being seen by our physicians.  ______________________________________________________________________  Should you have questions after your visit to the [facility], please contact our office at [phone] between the hours of 8:30 and 4:30 p.m.    Voicemails left after 4:30p.m will not be returned until the following business day.    For prescription refill requests, please have your pharmacy contact us directly.  Please also try to allow 48 hours for prescription requests.    Please contact the scheduling department for questions regarding scheduling.  For scheduling of procedures such as PET scans, CT scans, MRI, Ultrasound, etc please contact central scheduling at [phone].    Resources For Cancer Patients and Caregivers:   [employer]colink.org:  A wonderful resource for patients and healthcare providers for information regarding your disease, ways to tract your treatment, what to expect, etc.     American Cancer Society:  800-227-2345  Can help patients locate various types of support and financial assistance  Cancer Care: 1-800-813-HOPE (4673) Provides financial assistance, online support groups, medication/co-pay assistance.    [place] DSS:  336-641-3447 Where to apply for food  stamps, Medicaid, and utility assistance  Medicare Rights Center: 800-333-4114 Helps people with Medicare understand their rights and benefits, navigate the Medicare system, and secure the quality healthcare they deserve  SCAT: 336-333-6589 [employer] [place] [employer]'s shared-ride transportation service for eligible riders who have a disability that prevents them from riding the fixed route bus.    For additional information on assistance programs please contact our social worker:   Grier Hock/Abigail Elmore:  [phone]            

## 2017-01-24 NOTE — Progress Notes (Signed)
[name]    HEMATOLOGY/ONCOLOGY FOLLOW UP NOTE  Date of Service: 01/24/17   Patient Care Team: Unk Pinto, MD as PCP - General (Internal Medicine) Inda Castle, MD (Inactive) as Consulting Physician (Gastroenterology) Rexene Agent, MD as Attending Physician (Nephrology) Pearson Grippe MD -nephrology   CHIEF COMPLAINTS/PURPOSE OF CONSULTATION:  Renal AL Amyloidosis  HISTORY OF PRESENTING ILLNESS:  Gregory Galvan is a wonderful 76 y.o. male who has been referred to [place] by Dr .Unk Pinto, MD /Dr Pearson Grippe MD for evaluation and management of newly diagnosed Renal AL Amyloidosis.  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   He recently developed lower extremity swelling and increasing proteinuria and was referred to nephrology and was seen by Dr. Pearson Grippe at [facility].   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  SPEP showed a small M spike of 0.2 g/dL IFE showing IgG lambda monoclonal paraproteinemia. Increase in serum lambda light chain with a decreased kappa/Lambda light chain ratio.  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 [place] red stain positive.  Patient was referred to [place] for further evaluation and management of his newly diagnosed AL Amyloidosis .  Patient notes his leg swelling is better with diuretics.  Notes no fever no chills no night sweats no unexpected weight loss. No focal bone pains. Labs have not shown any overt anemia or hypercalcemia. His creatinine is relatively normal at 1. Hypoalbuminemia with albumin level of 2.7 g/dL.  INTERVAL HISTORY  Patient presents to the clinic today for follow up of his renal AL amyloidosis and for his next  weekly cycle of treatment. He reports that his previous rash was improving, however, this has now continued to worsen again and he states that his skin has now been peeling on his face. His cyclophosphamide was held until Dec 4th, and he believes that after receiving this again last week his rash began to worsen. He also reports that since beginning Prednisone that his stools have been more loose. He has not been using moisturizer regularly on his face to help with his rash or peeling. He denies any fever, chills, mouth sore, genital sores, diarrhea, or any other associated symptoms.   MEDICAL HISTORY:  Past Medical History:  Diagnosis Date  . BPH (benign prostatic hyperplasia)   . Hyperlipidemia   . Hypertension   . Hypogonadism, male   . Pre-diabetes   . Prostatitis   . 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 No surgical procedure performed. History of hypertension for 30 years on ACE inhibitor, beta blocker and Lasix Hypothyroidism   SURGICAL HISTORY: Past Surgical History:  Procedure Laterality Date  . APPENDECTOMY      SOCIAL HISTORY: Social History   Socioeconomic History  . Marital status: Divorced    Spouse name: Not on file  . Number of children: Not on file  . Years of education: Not on file  . Highest education level: Not on file  Social Needs  . Financial resource strain: Not on file  . Food insecurity - worry: Not on file  . Food insecurity - inability: Not on file  . Transportation needs - medical: Not on file  . Transportation needs -  non-medical: Not on file  Occupational History  . Not on file  Tobacco Use  . Smoking status: Former Smoker    Last attempt to quit: 12/22/2013    Years since quitting: 3.0  . Smokeless tobacco: Never Used  Substance and Sexual Activity  . Alcohol use: Yes    Alcohol/week: 16.8 oz    Types: 28 Standard drinks or equivalent per week    Comment: drinks wine  . Drug  use: No  . Sexual activity: Not on file  Other Topics Concern  . Not on file  Social History Narrative  . Not on file    FAMILY HISTORY: Family History  Problem Relation Age of Onset  . Cancer Mother        breast  . Alzheimer's disease Mother   . Hypertension Father   . Heart disease Father   . Lymphoma Father   . Hypertension Brother   . Heart disease Brother   . Heart disease Brother   . Hypertension Brother   . Diabetes Brother     ALLERGIES:  is allergic to ppd [tuberculin purified protein derivative]; sulfonamide derivatives; and penicillins.  MEDICATIONS:  Current Outpatient Medications  Medication Sig Dispense Refill  . acyclovir (ZOVIRAX) 200 MG capsule Take 2 capsules (400 mg total) daily by mouth. This replaces your acyclovir tabs due to allergy concern 60 capsule 1  . ALPRAZolam (XANAX) 0.5 MG tablet take 1/2 to 1 tablet by mouth at bedtime if needed 90 tablet 1  . dexamethasone (DECADRON) 4 MG tablet Take 2 tablets (8 mg total) by mouth daily. Start the day after chemotherapy for 2 days. 30 tablet 1  . ezetimibe (ZETIA) 10 MG tablet Take 1 tablet daily for Cholesterol 90 tablet 1  . furosemide (LASIX) 40 MG tablet 1-2 pills a day for swelling (Patient taking differently: Take 80 mg by mouth daily. ) 60 tablet 11  . latanoprost (XALATAN) 0.005 % ophthalmic solution Place 1 drop into both eyes at bedtime.     [name] levothyroxine (SYNTHROID, LEVOTHROID) 175 MCG tablet take 1 and 1/2 tablets by mouth once daily or as directed 135 tablet 1  . methylPREDNISolone (MEDROL) 4 MG tablet 3 tab x 2 days, 2 tab x 2 days, 1 tab x 2 days, 1/2 tab x 2 days 13 tablet 0  . milk thistle 175 MG tablet Take 175 mg by mouth daily.    . Multiple Vitamins-Minerals (MULTIVITAMIN WITH MINERALS) tablet Take 1 tablet by mouth daily.    . ondansetron (ZOFRAN) 8 MG tablet Take 1 tablet (8 mg total) by mouth 2 (two) times daily as needed for refractory nausea / vomiting. Start on day 3 after chemo.  30 tablet 1  . OVER THE COUNTER MEDICATION     . PARoxetine (PAXIL) 20 MG tablet Take 10 mg by mouth daily.    . predniSONE (DELTASONE) 20 MG tablet '60mg'$  po daily with breakfast for 3 days then '40mg'$  po daily x 4 days then prednisone '20mg'$  po daily until reassessment in about 1 week 60 tablet 0  . prochlorperazine (COMPAZINE) 10 MG tablet Take 1 tablet (10 mg total) by mouth every 6 (six) hours as needed (Nausea or vomiting). 30 tablet 1  . Turmeric 500 MG CAPS Take 1,500 mg elemental calcium/kg/hr by mouth daily.     No current facility-administered medications for this visit.     REVIEW OF SYSTEMS:    10 Point review of Systems was done is negative except as noted above.  PHYSICAL EXAMINATION: ECOG PERFORMANCE STATUS: 1 - Symptomatic but completely ambulatory  . Vitals:   01/24/17 1437  BP: (!) 141/85  Pulse: 100  Resp: 20  Temp: 97.7 F (36.5 C)  SpO2: 100%   Filed Weights   01/24/17 1437  Weight: 183 lb 6.4 oz (83.2 kg)   .Body mass index is 27.89 kg/m.  GENERAL:alert, in no acute distress and comfortable SKIN:  Diffuse, scaly rash across the trunk, upper extremities, and face. No concerning lesions.  EYES: conjunctiva are pink and non-injected, sclera anicteric OROPHARYNX: MMM, no exudates, no oropharyngeal erythema or ulceration NECK: supple, no JVD LYMPH:  no palpable lymphadenopathy in the cervical, axillary or inguinal regions LUNGS: clear to auscultation b/l with normal respiratory effort HEART: regular rate & rhythm ABDOMEN:  normoactive bowel sounds , non tender, not distended. No palpable hepatosplenomegaly Extremity: 2+ b/l pitting pedal edema PSYCH: alert & oriented x 3 with fluent speech NEURO: no focal motor/sensory deficits  LABORATORY DATA:  I have reviewed the data as listed  . CBC Latest Ref Rng & Units 01/24/2017 01/17/2017 01/10/2017  WBC 4.0 - 10.3 10e3/uL 5.7 6.9 7.4  Hemoglobin 13.0 - 17.1 g/dL 12.3(L) 12.8(L) 13.4  Hematocrit 38.4 - 49.9 %  36.6(L) 38.6 39.5  Platelets 140 - 400 10e3/uL 193 139(L) 141    . CMP Latest Ref Rng & Units 01/24/2017 01/17/2017 01/10/2017  Glucose 70 - 140 mg/dl 80 68(L) 85  BUN 7.0 - 26.0 mg/dL 18.5 14.7 23.8  Creatinine 0.7 - 1.3 mg/dL 0.9 0.9 0.9  Sodium 136 - 145 mEq/L 136 136 139  Potassium 3.5 - 5.1 mEq/L 4.0 4.0 3.5  Chloride 98 - 110 mmol/L - - -  CO2 22 - 29 mEq/L _0 Calcium 8.4 - 10.4 mg/dL 8.4 8.0(L) 8.5  Total Protein 6.4 - 8.3 g/dL 5.2(L) 5.0(L) 5.1(L)  Total Bilirubin 0.20 - 1.20 mg/dL 0.47 0.62 0.49  Alkaline Phos 40 - 150 U/L 93 98 91  AST 5 - 34 U/L 53(H) 50(H) 50(H)  ALT 0 - 55 U/L 39 35 38   Component     Latest Ref Rng & Units 10/25/2016 11/22/2016  IgG (Immunoglobin G), Serum     700 - 1,600 mg/dL 496 (L) 308 (L)  IgA/Immunoglobulin A, Serum     61 - 437 mg/dL 76 51 (L)  IgM, Qn, Serum     15 - 143 mg/dL 22 16  Total Protein     6.0 - 8.5 g/dL 4.9 (L) 4.6 (L)  Albumin SerPl Elph-Mcnc     2.9 - 4.4 g/dL 2.6 (L) 2.3 (L)  Alpha 1     0.0 - 0.4 g/dL 0.2 0.2  Alpha2 Glob SerPl Elph-Mcnc     0.4 - 1.0 g/dL 0.9 0.9  B-Globulin SerPl Elph-Mcnc     0.7 - 1.3 g/dL 0.7 0.8  Gamma Glob SerPl Elph-Mcnc     0.4 - 1.8 g/dL 0.4 0.3 (L)  M Protein SerPl Elph-Mcnc     Not Observed g/dL 0.2 (H) 0.1 (H)  Globulin, Total     2.2 - 3.9 g/dL 2.3 2.3  Albumin/Glob SerPl     0.7 - 1.7 1.2 1.1  IFE 1      Comment Comment  Please Note (HCV):      Comment Comment  Ig Kappa Free Light Chain     3.3 - 19.4 mg/L 9.5 7.4  Ig Lambda Free Light Chain     5.7 - 26.3 mg/L 120.9 (H)  66.7 (H)  Kappa/Lambda FluidC Ratio     0.26 - 1.65 0.08 (L) 0.11 (L)           Component     Latest Ref Rng & Units 08/22/2016 10/07/2016  C3 Complement     82 - 185 mg/dL 113   C4 Complement     15 - 53 mg/dL 32   Anit Nuclear Antibody(ANA)     NEGATIVE NEG   RPR     NON REAC NON REAC   Angiotensin-Converting Enzyme     9 - 67 U/L 17   ds DNA Ab     IU/mL 1   ANCA SCREEN       Negative   Hep C Virus Ab     0.0 - 0.9 s/co ratio  0.1   Component     Latest Ref Rng & Units 10/07/2016  Hep C Virus Ab     0.0 - 0.9 s/co ratio 0.1  Hepatitis B Surface Ag     Negative Negative  HIV Screen 4th Generation wRfx     Non Reactive Non Reactive                                   *[place]*                  *[facility]*                          [address]                        [place], [place] [address]                            [phone]  ------------------------------------------------------------------- Transthoracic Echocardiography  Patient:    Salim, Forero MR #:       [id number] Study Date: 10/12/2016 Gender:     M Age:        73 Height:     172.7 cm Weight:     77.7 kg BSA:        1.94 m^2 Pt. Status: Room:   SONOGRAPHER  Dustin Flock, RCS  PERFORMING   [facility], Outpatient  ATTENDING    [place], [place], [place] [facility]  REFERRING    [place], [place] [facility]  cc:  ------------------------------------------------------------------- LV EF: 65% -   70%  ------------------------------------------------------------------- Indications:      Pre-op evaluation V728.1.  ------------------------------------------------------------------- History:   PMH:  Renal Amyloidosis, Hypertension, pre-chemo.  ------------------------------------------------------------------- Study Conclusions  - Left ventricle: The cavity size was normal. Wall thickness was   increased in a pattern of moderate LVH. Systolic function was   vigorous. The estimated ejection fraction was in the range of 65%   to 70%. Wall motion was normal; there were no regional wall   motion abnormalities. Doppler parameters are consistent with   abnormal left ventricular relaxation (grade 1 diastolic   dysfunction). - Mitral valve: There was trivial regurgitation. - Left atrium: The atrium was mildly  dilated.    RADIOGRAPHIC STUDIES:  I have personally reviewed the radiological images as listed and agreed with the findings in the report. No results found.  ASSESSMENT & PLAN:   77 year old Caucasian male with  #1 Newly diagnosed Renal  AL Amyloidosis. Lambda Light chain subtype Significant decrease in serum lambda free light chains following treatment  No overt evidence of multiple myeloma thus far.  -No hypercalcemia, normal creatinine level, no anemia. -No evidence of bone lesions on whole body skeletal survey. BM Bx- no evidence of myeloma  Echo shows normal ejection fraction with grade 1 diastolic dysfunction.  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  Hepatitis C, HIV and hepatitis B negative. Other workup as per nephrologist negative. Patient follows with Dr. Pearson Grippe  [facility].  #3 Grade 2 macular/desquamative rash over trunk, extremities, and face with no evidence of mucosal involvement. Now worsened since beginning cyclophosphamide after holding.  PLAN -labs stable today.  His recent SPEP and SFLC show response to treatment with improvement in M spike from 0.2 to 0.1g/dl and Seru free Lambda light chains from 120 to 66.7 to 49.7.  24h UPEP total protein has decreased from 4.22 to 2.9 g per 24 hours. -Continue cetirizine/Zyrtec 10 mg p.o. Daily -May use additional Benadryl 25 mg p.o. daily every 8 hours as needed for rash and itching. -Continue famotidine/Pepcid 20 mg p.o. twice daily. -Use only lukewarm short showers. -was advised to keep skin well moisturized. -Use nonmedicated nondeodorant containing mild soaps. -We will discontinue all treatment medications including cyclophosphamide this week since his rash has worsened. We will continue him on a burst of Prednisone with antihistamines/pepcid as well to help his rash.  -will need to permanently discontinue Cyclophosphamide and will restart velcade once rash resolving. -I have also encouraged  him to continue to moisturizing his skin to help with his rash, I also encouraged him to add hydrocortisone with this as well.    -Will refer to dermatology for further opinion on this rash.   #3 Leg swelling - due to hypoalbuminemia from his nephrotic syndrome + Steroids. Improved some. Plan -continue diuretics as per nephrologist. Currently taking lasix 55m po daily. -. I also urged him to only consume 48-64oz daily to reduce that amount of fluid he retains.  -minimize salt intake - compression socks given -elevated legs as much as possible. - Swelling much improved today  Hold treatment today RTC with Dr KIrene Limboin 1 week Referral to dermatology  No orders of the defined types were placed in this encounter.  All of the patients questions were answered with apparent satisfaction. The patient knows to call the clinic with any problems, questions or concerns.  I spent 20 minutes counseling the patient face to face. The total time spent in the appointment was 25 minutes and more than 50% was on counseling and direct patient cares.  GSullivan LoneMD MNew LondonAAHIVMS SWest Hills Surgical Center LtdCBoone Memorial HospitalHematology/Oncology Physician CMill Creek Endoscopy Suites Inc (Office):       [phone](Work cell):  [phone](Fax):           [phone] This document serves as a record of services personally performed by GSullivan Lone MD. It was created on his behalf by WReola Mosher a trained medical scribe. The creation of this record is based on the scribe's personal observations and the provider's statements to them.   .I have reviewed the above documentation for accuracy and completeness, and I agree with the above. .Brunetta GeneraMD MS  `

## 2017-01-24 NOTE — Telephone Encounter (Signed)
Gave avs and calendar for December  °

## 2017-01-31 ENCOUNTER — Ambulatory Visit: Payer: Medicare Other | Admitting: Hematology

## 2017-01-31 ENCOUNTER — Ambulatory Visit (HOSPITAL_BASED_OUTPATIENT_CLINIC_OR_DEPARTMENT_OTHER): Payer: Medicare Other

## 2017-01-31 ENCOUNTER — Other Ambulatory Visit (HOSPITAL_BASED_OUTPATIENT_CLINIC_OR_DEPARTMENT_OTHER): Payer: Medicare Other

## 2017-01-31 VITALS — BP 135/82 | HR 86 | Temp 98.4°F | Resp 18 | Wt 177.5 lb

## 2017-01-31 DIAGNOSIS — I10 Essential (primary) hypertension: Secondary | ICD-10-CM

## 2017-01-31 DIAGNOSIS — R21 Rash and other nonspecific skin eruption: Secondary | ICD-10-CM | POA: Diagnosis not present

## 2017-01-31 DIAGNOSIS — E8581 Light chain (AL) amyloidosis: Secondary | ICD-10-CM

## 2017-01-31 LAB — CBC & DIFF AND RETIC
BASO%: 0.4 % (ref 0.0–2.0)
Basophils Absolute: 0 10*3/uL (ref 0.0–0.1)
EOS%: 0.5 % (ref 0.0–7.0)
Eosinophils Absolute: 0.1 10*3/uL (ref 0.0–0.5)
HCT: 38.2 % — ABNORMAL LOW (ref 38.4–49.9)
HGB: 13 g/dL (ref 13.0–17.1)
IMMATURE RETIC FRACT: 4.3 % (ref 3.00–10.60)
LYMPH#: 0.5 10*3/uL — AB (ref 0.9–3.3)
LYMPH%: 5.2 % — ABNORMAL LOW (ref 14.0–49.0)
MCH: 33.9 pg — ABNORMAL HIGH (ref 27.2–33.4)
MCHC: 34 g/dL (ref 32.0–36.0)
MCV: 99.5 fL — AB (ref 79.3–98.0)
MONO#: 0.8 10*3/uL (ref 0.1–0.9)
MONO%: 8 % (ref 0.0–14.0)
NEUT%: 85.9 % — ABNORMAL HIGH (ref 39.0–75.0)
NEUTROS ABS: 8.6 10*3/uL — AB (ref 1.5–6.5)
Platelets: 305 10*3/uL (ref 140–400)
RBC: 3.84 10*6/uL — AB (ref 4.20–5.82)
RDW: 15.5 % — ABNORMAL HIGH (ref 11.0–14.6)
RETIC CT ABS: 66.82 10*3/uL (ref 34.80–93.90)
Retic %: 1.74 % (ref 0.80–1.80)
WBC: 10.1 10*3/uL (ref 4.0–10.3)

## 2017-01-31 LAB — COMPREHENSIVE METABOLIC PANEL
ALT: 37 U/L (ref 0–55)
ANION GAP: 8 meq/L (ref 3–11)
AST: 51 U/L — ABNORMAL HIGH (ref 5–34)
Albumin: 2.7 g/dL — ABNORMAL LOW (ref 3.5–5.0)
Alkaline Phosphatase: 100 U/L (ref 40–150)
BILIRUBIN TOTAL: 0.41 mg/dL (ref 0.20–1.20)
BUN: 21.9 mg/dL (ref 7.0–26.0)
CHLORIDE: 100 meq/L (ref 98–109)
CO2: 24 meq/L (ref 22–29)
Calcium: 8.7 mg/dL (ref 8.4–10.4)
Creatinine: 1 mg/dL (ref 0.7–1.3)
GLUCOSE: 136 mg/dL (ref 70–140)
Potassium: 4.2 mEq/L (ref 3.5–5.1)
SODIUM: 133 meq/L — AB (ref 136–145)
Total Protein: 5.4 g/dL — ABNORMAL LOW (ref 6.4–8.3)

## 2017-01-31 MED ORDER — DEXAMETHASONE 4 MG PO TABS
ORAL_TABLET | ORAL | Status: AC
  Filled 2017-01-31: qty 5

## 2017-01-31 MED ORDER — DEXAMETHASONE 4 MG PO TABS
20.0000 mg | ORAL_TABLET | Freq: Once | ORAL | Status: AC
  Administered 2017-01-31: 20 mg via ORAL

## 2017-01-31 NOTE — Patient Instructions (Addendum)
No Treatment, see note.

## 2017-01-31 NOTE — Progress Notes (Signed)
Dr. Irene Limbo at chairside to assess patient (see Dr. Grier Mitts office note). Recommendations given to pt regarding "rash" pt verbalizes understanding. Per Dr. Irene Limbo NO treatment today, pt to receive 20 mg of PO decadron today and then okay to discharge pt home.

## 2017-02-08 ENCOUNTER — Other Ambulatory Visit (HOSPITAL_BASED_OUTPATIENT_CLINIC_OR_DEPARTMENT_OTHER): Payer: Medicare Other

## 2017-02-08 ENCOUNTER — Ambulatory Visit (HOSPITAL_BASED_OUTPATIENT_CLINIC_OR_DEPARTMENT_OTHER): Payer: Medicare Other

## 2017-02-08 VITALS — BP 122/75 | HR 96 | Temp 97.1°F | Resp 18

## 2017-02-08 DIAGNOSIS — E8581 Light chain (AL) amyloidosis: Secondary | ICD-10-CM | POA: Diagnosis not present

## 2017-02-08 DIAGNOSIS — Z5112 Encounter for antineoplastic immunotherapy: Secondary | ICD-10-CM

## 2017-02-08 LAB — CBC & DIFF AND RETIC
BASO%: 0.6 % (ref 0.0–2.0)
Basophils Absolute: 0.1 10*3/uL (ref 0.0–0.1)
EOS%: 2.8 % (ref 0.0–7.0)
Eosinophils Absolute: 0.3 10*3/uL (ref 0.0–0.5)
HCT: 37.5 % — ABNORMAL LOW (ref 38.4–49.9)
HGB: 12.7 g/dL — ABNORMAL LOW (ref 13.0–17.1)
IMMATURE RETIC FRACT: 3.1 % (ref 3.00–10.60)
LYMPH#: 0.8 10*3/uL — AB (ref 0.9–3.3)
LYMPH%: 9.1 % — AB (ref 14.0–49.0)
MCH: 33.7 pg — ABNORMAL HIGH (ref 27.2–33.4)
MCHC: 33.9 g/dL (ref 32.0–36.0)
MCV: 99.5 fL — AB (ref 79.3–98.0)
MONO#: 1 10*3/uL — AB (ref 0.1–0.9)
MONO%: 11.2 % (ref 0.0–14.0)
NEUT%: 76.3 % — ABNORMAL HIGH (ref 39.0–75.0)
NEUTROS ABS: 6.9 10*3/uL — AB (ref 1.5–6.5)
PLATELETS: 182 10*3/uL (ref 140–400)
RBC: 3.77 10*6/uL — AB (ref 4.20–5.82)
RDW: 14.9 % — ABNORMAL HIGH (ref 11.0–14.6)
RETIC CT ABS: 54.29 10*3/uL (ref 34.80–93.90)
Retic %: 1.44 % (ref 0.80–1.80)
WBC: 9 10*3/uL (ref 4.0–10.3)

## 2017-02-08 MED ORDER — BORTEZOMIB CHEMO SQ INJECTION 3.5 MG (2.5MG/ML)
1.5000 mg/m2 | Freq: Once | INTRAMUSCULAR | Status: AC
  Administered 2017-02-08: 3 mg via SUBCUTANEOUS
  Filled 2017-02-08: qty 3

## 2017-02-08 MED ORDER — ONDANSETRON HCL 8 MG PO TABS
4.0000 mg | ORAL_TABLET | Freq: Once | ORAL | Status: AC
  Administered 2017-02-08: 4 mg via ORAL

## 2017-02-08 MED ORDER — ONDANSETRON HCL 8 MG PO TABS
ORAL_TABLET | ORAL | Status: AC
  Filled 2017-02-08: qty 1

## 2017-02-08 MED ORDER — DEXAMETHASONE 4 MG PO TABS
20.0000 mg | ORAL_TABLET | Freq: Once | ORAL | Status: AC
  Administered 2017-02-08: 20 mg via ORAL

## 2017-02-08 MED ORDER — DEXAMETHASONE 4 MG PO TABS
ORAL_TABLET | ORAL | Status: AC
  Filled 2017-02-08: qty 5

## 2017-02-08 NOTE — Patient Instructions (Signed)
[facility] Discharge Instructions for Patients Receiving Chemotherapy  Today you received the following chemotherapy agents Velcade.  To help prevent nausea and vomiting after your treatment, we encourage you to take your nausea medication as directed.  If you develop nausea and vomiting that is not controlled by your nausea medication, call the clinic.   BELOW ARE SYMPTOMS THAT SHOULD BE REPORTED IMMEDIATELY:  *FEVER GREATER THAN 100.5 F  *CHILLS WITH OR WITHOUT FEVER  NAUSEA AND VOMITING THAT IS NOT CONTROLLED WITH YOUR NAUSEA MEDICATION  *UNUSUAL SHORTNESS OF BREATH  *UNUSUAL BRUISING OR BLEEDING  TENDERNESS IN MOUTH AND THROAT WITH OR WITHOUT PRESENCE OF ULCERS  *URINARY PROBLEMS  *BOWEL PROBLEMS  UNUSUAL RASH Items with * indicate a potential emergency and should be followed up as soon as possible.  Feel free to call the clinic should you have any questions or concerns. The clinic phone number is [phone].  Please show the CHEMO ALERT CARD at check-in to the Emergency Department and triage nurse.   

## 2017-02-08 NOTE — Progress Notes (Signed)
OK to proceed with treatment with CMET 12/18 per Dr. Julien Nordmann.

## 2017-02-15 ENCOUNTER — Inpatient Hospital Stay (HOSPITAL_BASED_OUTPATIENT_CLINIC_OR_DEPARTMENT_OTHER): Payer: Medicare Other

## 2017-02-15 ENCOUNTER — Inpatient Hospital Stay: Payer: Medicare Other | Attending: Hematology

## 2017-02-15 ENCOUNTER — Other Ambulatory Visit: Payer: Self-pay

## 2017-02-15 VITALS — BP 139/88 | HR 104 | Temp 99.3°F | Resp 18

## 2017-02-15 DIAGNOSIS — N08 Glomerular disorders in diseases classified elsewhere: Secondary | ICD-10-CM | POA: Insufficient documentation

## 2017-02-15 DIAGNOSIS — M7989 Other specified soft tissue disorders: Secondary | ICD-10-CM | POA: Diagnosis not present

## 2017-02-15 DIAGNOSIS — E8581 Light chain (AL) amyloidosis: Secondary | ICD-10-CM

## 2017-02-15 DIAGNOSIS — Z87891 Personal history of nicotine dependence: Secondary | ICD-10-CM | POA: Diagnosis not present

## 2017-02-15 DIAGNOSIS — E119 Type 2 diabetes mellitus without complications: Secondary | ICD-10-CM | POA: Insufficient documentation

## 2017-02-15 DIAGNOSIS — E039 Hypothyroidism, unspecified: Secondary | ICD-10-CM | POA: Insufficient documentation

## 2017-02-15 DIAGNOSIS — E854 Organ-limited amyloidosis: Secondary | ICD-10-CM | POA: Insufficient documentation

## 2017-02-15 DIAGNOSIS — J069 Acute upper respiratory infection, unspecified: Secondary | ICD-10-CM | POA: Insufficient documentation

## 2017-02-15 DIAGNOSIS — E8809 Other disorders of plasma-protein metabolism, not elsewhere classified: Secondary | ICD-10-CM | POA: Diagnosis not present

## 2017-02-15 DIAGNOSIS — Z5112 Encounter for antineoplastic immunotherapy: Secondary | ICD-10-CM | POA: Diagnosis present

## 2017-02-15 DIAGNOSIS — E859 Amyloidosis, unspecified: Secondary | ICD-10-CM | POA: Diagnosis not present

## 2017-02-15 DIAGNOSIS — I10 Essential (primary) hypertension: Secondary | ICD-10-CM | POA: Insufficient documentation

## 2017-02-15 LAB — CBC WITH DIFFERENTIAL/PLATELET
BASO%: 0.2 % (ref 0.0–2.0)
BASOS ABS: 0 10*3/uL (ref 0.0–0.1)
EOS%: 2.4 % (ref 0.0–7.0)
Eosinophils Absolute: 0.3 10*3/uL (ref 0.0–0.5)
HCT: 39.5 % (ref 38.4–49.9)
HGB: 13.5 g/dL (ref 13.0–17.1)
LYMPH%: 7.6 % — AB (ref 14.0–49.0)
MCH: 33.8 pg — AB (ref 27.2–33.4)
MCHC: 34.2 g/dL (ref 32.0–36.0)
MCV: 99 fL — AB (ref 79.3–98.0)
MONO#: 0.4 10*3/uL (ref 0.1–0.9)
MONO%: 3.2 % (ref 0.0–14.0)
NEUT#: 9.8 10*3/uL — ABNORMAL HIGH (ref 1.5–6.5)
NEUT%: 86.6 % — AB (ref 39.0–75.0)
Platelets: 190 10*3/uL (ref 140–400)
RBC: 3.99 10*6/uL — AB (ref 4.20–5.82)
RDW: 14.6 % (ref 11.0–14.6)
WBC: 11.3 10*3/uL — ABNORMAL HIGH (ref 4.0–10.3)
lymph#: 0.9 10*3/uL (ref 0.9–3.3)

## 2017-02-15 LAB — COMPREHENSIVE METABOLIC PANEL
ALBUMIN: 2.5 g/dL — AB (ref 3.5–5.0)
ALK PHOS: 99 U/L (ref 40–150)
ALT: 41 U/L (ref 0–55)
AST: 39 U/L — ABNORMAL HIGH (ref 5–34)
Anion Gap: 9 mEq/L (ref 3–11)
BUN: 27.5 mg/dL — ABNORMAL HIGH (ref 7.0–26.0)
CHLORIDE: 99 meq/L (ref 98–109)
CO2: 28 mEq/L (ref 22–29)
Calcium: 8.8 mg/dL (ref 8.4–10.4)
Creatinine: 0.9 mg/dL (ref 0.7–1.3)
GLUCOSE: 87 mg/dL (ref 70–140)
POTASSIUM: 4.1 meq/L (ref 3.5–5.1)
SODIUM: 135 meq/L — AB (ref 136–145)
Total Bilirubin: 0.34 mg/dL (ref 0.20–1.20)
Total Protein: 5.5 g/dL — ABNORMAL LOW (ref 6.4–8.3)

## 2017-02-15 MED ORDER — DEXAMETHASONE 4 MG PO TABS
ORAL_TABLET | ORAL | Status: AC
  Filled 2017-02-15: qty 5

## 2017-02-15 MED ORDER — BORTEZOMIB CHEMO SQ INJECTION 3.5 MG (2.5MG/ML)
1.5000 mg/m2 | Freq: Once | INTRAMUSCULAR | Status: AC
  Administered 2017-02-15: 3 mg via SUBCUTANEOUS
  Filled 2017-02-15: qty 3

## 2017-02-15 MED ORDER — DEXAMETHASONE 4 MG PO TABS
20.0000 mg | ORAL_TABLET | Freq: Once | ORAL | Status: AC
  Administered 2017-02-15: 20 mg via ORAL

## 2017-02-15 MED ORDER — ONDANSETRON HCL 8 MG PO TABS
4.0000 mg | ORAL_TABLET | Freq: Once | ORAL | Status: AC
  Administered 2017-02-15: 4 mg via ORAL

## 2017-02-15 MED ORDER — ONDANSETRON HCL 8 MG PO TABS
ORAL_TABLET | ORAL | Status: AC
  Filled 2017-02-15: qty 1

## 2017-02-17 ENCOUNTER — Telehealth: Payer: Self-pay | Admitting: Hematology

## 2017-02-17 NOTE — Telephone Encounter (Signed)
Scheduled appt per 1/4 sch message - Patient is aware of appt date and time.

## 2017-02-20 ENCOUNTER — Other Ambulatory Visit: Payer: Self-pay | Admitting: *Deleted

## 2017-02-20 ENCOUNTER — Ambulatory Visit (HOSPITAL_COMMUNITY)
Admission: RE | Admit: 2017-02-20 | Discharge: 2017-02-20 | Disposition: A | Discharge status: Home / Self Care | Payer: Medicare Other | Source: Ambulatory Visit | Attending: Medical | Admitting: Medical

## 2017-02-20 ENCOUNTER — Telehealth: Payer: Self-pay | Admitting: *Deleted

## 2017-02-20 ENCOUNTER — Inpatient Hospital Stay (HOSPITAL_BASED_OUTPATIENT_CLINIC_OR_DEPARTMENT_OTHER): Payer: Medicare Other | Admitting: Medical

## 2017-02-20 ENCOUNTER — Inpatient Hospital Stay: Payer: Medicare Other

## 2017-02-20 VITALS — BP 126/75 | HR 113 | Temp 97.8°F | Resp 20 | Ht 68.0 in | Wt 175.7 lb

## 2017-02-20 DIAGNOSIS — J069 Acute upper respiratory infection, unspecified: Secondary | ICD-10-CM

## 2017-02-20 DIAGNOSIS — E8581 Light chain (AL) amyloidosis: Secondary | ICD-10-CM

## 2017-02-20 DIAGNOSIS — E859 Amyloidosis, unspecified: Secondary | ICD-10-CM | POA: Diagnosis not present

## 2017-02-20 DIAGNOSIS — R05 Cough: Secondary | ICD-10-CM | POA: Insufficient documentation

## 2017-02-20 DIAGNOSIS — R509 Fever, unspecified: Secondary | ICD-10-CM | POA: Diagnosis not present

## 2017-02-20 DIAGNOSIS — R918 Other nonspecific abnormal finding of lung field: Secondary | ICD-10-CM | POA: Diagnosis not present

## 2017-02-20 LAB — CBC WITH DIFFERENTIAL (CANCER CENTER ONLY)
Abs Granulocyte: 8 10*3/uL — ABNORMAL HIGH (ref 1.5–6.5)
Basophils Absolute: 0 10*3/uL (ref 0.0–0.1)
Basophils Relative: 0 %
EOS ABS: 0.1 10*3/uL (ref 0.0–0.5)
Eosinophils Relative: 1 %
HCT: 40.9 % (ref 38.4–49.9)
HEMOGLOBIN: 13.8 g/dL (ref 13.0–17.1)
LYMPHS ABS: 0.6 10*3/uL — AB (ref 0.9–3.3)
Lymphocytes Relative: 7 %
MCH: 33.1 pg (ref 27.2–33.4)
MCHC: 33.7 g/dL (ref 32.0–36.0)
MCV: 98.1 fL — ABNORMAL HIGH (ref 79.3–98.0)
MONO ABS: 0.5 10*3/uL (ref 0.1–0.9)
Monocytes Relative: 6 %
Neutro Abs: 8 10*3/uL — ABNORMAL HIGH (ref 1.5–6.5)
Neutrophils Relative %: 86 %
PLATELETS: 194 10*3/uL (ref 140–400)
RBC: 4.17 MIL/uL — ABNORMAL LOW (ref 4.20–5.82)
RDW: 14.7 % (ref 11.0–15.6)
WBC Count: 9.3 10*3/uL (ref 4.0–10.3)

## 2017-02-20 LAB — CMP (CANCER CENTER ONLY)
ALK PHOS: 114 U/L (ref 40–150)
ALT: 48 U/L (ref 0–55)
ANION GAP: 6 (ref 3–11)
AST: 49 U/L — ABNORMAL HIGH (ref 5–34)
Albumin: 2.3 g/dL — ABNORMAL LOW (ref 3.5–5.0)
BUN: 22 mg/dL (ref 7–26)
CALCIUM: 8.3 mg/dL — AB (ref 8.4–10.4)
CO2: 25 mmol/L (ref 22–29)
Chloride: 99 mmol/L (ref 98–109)
Creatinine: 1.07 mg/dL (ref 0.70–1.30)
Glucose, Bld: 93 mg/dL (ref 70–140)
Potassium: 4.5 mmol/L (ref 3.5–5.1)
Sodium: 130 mmol/L — ABNORMAL LOW (ref 136–145)
TOTAL PROTEIN: 5.2 g/dL — AB (ref 6.4–8.3)
Total Bilirubin: 0.4 mg/dL (ref 0.2–1.2)

## 2017-02-20 MED ORDER — DOXYCYCLINE HYCLATE 100 MG PO TABS: 100.0000 mg | ORAL_TABLET | Freq: Two times a day (BID) | ORAL | 0 refills | Status: DC

## 2017-02-20 NOTE — Telephone Encounter (Signed)
Pt called c/o of fever of 100.7, congestion, and yellow mucus.  Reviewed with Dr. Irene Limbo, pt should be evaluated in clinic.  Reviewed with Eustaquio Maize, RN in [facility].  Pt to come in asap for [place].  Pt verbalized understanding.  High priority message sent to scheduling.  Labs ordered per Eustaquio Maize, Therapist, sports

## 2017-02-20 NOTE — Telephone Encounter (Signed)
"  I have a fever of 100.7 this morning when I woke up and this afternoon.  Have not taken anything over the counter.  No chills.  I guess I should have called because two to three weeks ago I began having congestion in my chest which moved to my head.  I had a cough  Bringing up yellow phlegm that now is white.  No fever until today.  I take zyrtec for seasonal allergies.  No s.o.b, pain, change in urine as I've had a slow flow for a long time.  Call me at [phone]."

## 2017-02-21 LAB — URINE CULTURE

## 2017-02-21 NOTE — Progress Notes (Signed)
[facility] Progress Note   Gregory Galvan [id number] April 16, 1940 77 y.o.  Gregory Galvan is managed by Dr. Sullivan Lone  Actively treated with chemotherapy: yes  Current Therapy: Velcade  Last Treated: 02/15/2017  Assessment: Plan:    Upper respiratory tract infection, unspecified type - Plan: doxycycline (VIBRA-TABS) 100 MG tablet, DG Chest 2 View   Upper respiratory tract infection: Gregory Galvan was referred for a chest x-ray today.  He was given a prescription for doxycycline 100 mg p.o. twice daily times 7 days.  Please see After Visit Summary for patient specific instructions.  Future Appointments  Date Time Provider [place]  02/22/2017  1:40 PM Brunetta Genera, MD [facility] None  02/22/2017  2:30 PM [facility] G22 [facility] None  02/28/2017  2:30 PM Liane Comber, NP [facility] None  03/01/2017  1:30 PM [facility] LAB 2 [facility] None  03/01/2017  2:15 PM [facility] H30 [facility] None  03/08/2017  2:45 PM [facility] LAB 3 [facility] None  03/08/2017  3:30 PM [facility] B6 [facility] None  03/15/2017  2:15 PM [facility] LAB 3 [facility] None  03/15/2017  3:00 PM [facility] A2 [facility] None  03/22/2017  2:45 PM [facility] LAB 3 [facility] None  03/22/2017  3:30 PM [facility] C9 [facility] None  03/29/2017  2:00 PM [facility] LAB 2 [facility] None  03/29/2017  2:45 PM [facility] H29 [facility] None  06/06/2017  2:30 PM Unk Pinto, MD [facility] None  12/21/2017 10:00 AM Unk Pinto, MD [facility] None    Orders Placed This Encounter  Procedures  . DG Chest 2 View       Subjective:   Patient ID:  Gregory Galvan is a 78 y.o. (DOB Jun 24, 1940) male.  Chief Complaint:  Chief Complaint  Patient presents with  . Fever    HPI Gregory Galvan is a 77 year old male with a diagnosis of a renal amyloidosis currently treated with Velcade with his last dose given on 02/15/2017.  He presents to the office today  with a 2-week history of congestion in his chest with a productive cough with yellowish sputum.  He had a fever of 100.7 this morning.  He denies chills, sweats, headache, sinus pressure, sinus pain, postnasal drainage, or rhinorrhea.  He has not taken anything for his cough to date.  Medications: I have reviewed the patient's current medications.  Allergies:  Allergies  Allergen Reactions  . 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 Sulfonamide Derivatives Itching and Swelling  . Penicillins Swelling and Rash    Past Medical History:  Diagnosis Date  . BPH (benign prostatic hyperplasia)   . Hyperlipidemia   . Hypertension   . Hypogonadism, male   . Pre-diabetes   . Prostatitis   . Thyroid disease     Past Surgical History:  Procedure Laterality Date  . APPENDECTOMY      Family History  Problem Relation Age of Onset  . Cancer Mother        breast  . Alzheimer's disease Mother   . Hypertension Father   . Heart disease Father   . Lymphoma Father   . Hypertension Brother   . Heart disease Brother   . Heart disease Brother   . Hypertension Brother   . Diabetes Brother     Social History   Socioeconomic History  . Marital status: Divorced    Spouse name: Not on file  . Number of children: Not on file  . Years of education:  Not on file  . Highest education level: Not on file  Social Needs  . Financial resource strain: Not on file  . Food insecurity - worry: Not on file  . Food insecurity - inability: Not on file  . Transportation needs - medical: Not on file  . Transportation needs - non-medical: Not on file  Occupational History  . Not on file  Tobacco Use  . Smoking status: Former Smoker    Last attempt to quit: 12/22/2013    Years since quitting: 3.1  . Smokeless tobacco: Never Used  Substance and Sexual Activity  . Alcohol use: Yes    Alcohol/week: 16.8 oz    Types: 28 Standard drinks or equivalent per week     Comment: drinks wine  . Drug use: No  . Sexual activity: Not on file  Other Topics Concern  . Not on file  Social History Narrative  . Not on file    Past Medical History, Surgical history, Social history, and Family history were reviewed and updated as appropriate.   Please see review of systems for further details on the patient's review from today.   Review of Systems:  Review of Systems  Constitutional: Positive for fever. Negative for appetite change, chills and diaphoresis.  HENT: Positive for congestion. Negative for postnasal drip, rhinorrhea, sinus pressure, sinus pain, sneezing and sore throat.   Respiratory: Positive for cough. Negative for chest tightness, shortness of breath and wheezing.   Cardiovascular: Negative for chest pain, palpitations and leg swelling.  Gastrointestinal: Negative for constipation, diarrhea and nausea.  Neurological: Negative for headaches.    Objective:   Physical Exam:  BP 126/75 (BP Location: Right Arm, Patient Position: Sitting)   Pulse (!) 113   Temp 97.8 F (36.6 C) (Oral)   Resp 20   Ht 5\' 8"  (1.727 m)   Wt 175 lb 11.2 oz (79.7 kg)   SpO2 96%   BMI 26.72 kg/m  ECOG: 0  Physical Exam  Constitutional: No distress.  HENT:  Head: Normocephalic and atraumatic.  Right Ear: External ear normal.  Left Ear: External ear normal.  Mouth/Throat: Oropharynx is clear and moist. No oropharyngeal exudate.  Eyes: Right eye exhibits no discharge. Left eye exhibits no discharge. No scleral icterus.  Neck: Normal range of motion. Neck supple.  Cardiovascular: Normal rate, regular rhythm and normal heart sounds. Exam reveals no gallop and no friction rub.  No murmur heard. Pulmonary/Chest: Effort normal and breath sounds normal. No respiratory distress. He has no wheezes.      Lymphadenopathy:    He has no cervical adenopathy.  Neurological: He is alert. Coordination normal.  Skin: Skin is warm and dry. He is not diaphoretic.    Lab  Review:     Component Value Date/Time   NA 130 (L) 02/20/2017 1554   NA 135 (L) 02/15/2017 1414   K 4.5 02/20/2017 1554   K 4.1 02/15/2017 1414   CL 99 02/20/2017 1554   CO2 25 02/20/2017 1554   CO2 28 02/15/2017 1414   GLUCOSE 93 02/20/2017 1554   GLUCOSE 87 02/15/2017 1414   BUN 22 02/20/2017 1554   BUN 27.5 (H) 02/15/2017 1414   CREATININE 0.9 02/15/2017 1414   CALCIUM 8.3 (L) 02/20/2017 1554   CALCIUM 8.8 02/15/2017 1414   PROT 5.2 (L) 02/20/2017 1554   PROT 5.5 (L) 02/15/2017 1414   ALBUMIN 2.3 (L) 02/20/2017 1554   ALBUMIN 2.5 (L) 02/15/2017 1414   AST 49 (H) 02/20/2017 1554  AST 39 (H) 02/15/2017 1414   ALT 48 02/20/2017 1554   ALT 41 02/15/2017 1414   ALKPHOS 114 02/20/2017 1554   ALKPHOS 99 02/15/2017 1414   BILITOT 0.4 02/20/2017 1554   BILITOT 0.34 02/15/2017 1414   GFRNONAA 80 11/18/2016 1119   GFRAA 92 11/18/2016 1119       Component Value Date/Time   WBC 11.3 (H) 02/15/2017 1414   WBC 4.8 11/18/2016 1119   RBC 4.17 (L) 02/20/2017 1554   HGB 13.5 02/15/2017 1414   HCT 40.9 02/20/2017 1554   HCT 39.5 02/15/2017 1414   PLT 190 02/15/2017 1414   MCV 98.1 (H) 02/20/2017 1554   MCV 99.0 (H) 02/15/2017 1414   MCH 33.1 02/20/2017 1554   MCHC 33.7 02/20/2017 1554   RDW 14.7 02/20/2017 1554   RDW 14.6 02/15/2017 1414   LYMPHSABS 0.6 (L) 02/20/2017 1554   LYMPHSABS 0.9 02/15/2017 1414   MONOABS 0.5 02/20/2017 1554   MONOABS 0.4 02/15/2017 1414   EOSABS 0.1 02/20/2017 1554   EOSABS 0.3 02/15/2017 1414   BASOSABS 0.0 02/20/2017 1554   BASOSABS 0.0 02/15/2017 1414   -------------------------------  Imaging from last 24 hours (if applicable):  Radiology interpretation: Dg Chest 2 View  Result Date: 02/21/2017 CLINICAL DATA:  Upper respiratory tract infection. EXAM: CHEST  2 VIEW COMPARISON:  Chest x-ray 02/02/2011. FINDINGS: Mediastinum and hilar structures are normal. Heart size normal. Diffuse bilateral mild interstitial prominence noted. Mild  pneumonitis cannot be excluded. No pleural effusion or pneumothorax. Degenerative changes thoracic spine. IMPRESSION: Diffuse mild bilateral interstitial prominence. Mild pneumonitis cannot be excluded. Electronically Signed   By: Marcello Moores  Register   On: 02/21/2017 06:15

## 2017-02-21 NOTE — Progress Notes (Signed)
[name]    HEMATOLOGY/ONCOLOGY FOLLOW UP NOTE  Date of Service: 02/22/17   Patient Care Team: Unk Pinto, MD as PCP - General (Internal Medicine) Inda Castle, MD (Inactive) as Consulting Physician (Gastroenterology) Rexene Agent, MD as Attending Physician (Nephrology) Pearson Grippe MD -nephrology   CHIEF COMPLAINTS/PURPOSE OF CONSULTATION:  Renal AL Amyloidosis  HISTORY OF PRESENTING ILLNESS:  Gregory Galvan is a wonderful 77 y.o. male who has been referred to [place] by Dr .Unk Pinto, MD /Dr Pearson Grippe MD for evaluation and management of newly diagnosed Renal AL Amyloidosis.  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   He recently developed lower extremity swelling and increasing proteinuria and was referred to nephrology and was seen by Dr. Pearson Grippe at [facility].   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  SPEP showed a small M spike of 0.2 g/dL IFE showing IgG lambda monoclonal paraproteinemia. Increase in serum lambda light chain with a decreased kappa/Lambda light chain ratio.  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 [place] red stain positive.  Patient was referred to [place] for further evaluation and management of his newly diagnosed AL Amyloidosis .  Patient notes his leg swelling is better with diuretics.  Notes no fever no chills no night sweats no unexpected weight loss. No focal bone pains. Labs have not shown any overt anemia or hypercalcemia. His creatinine is relatively normal at 1. Hypoalbuminemia with albumin level of 2.7 g/dL.  INTERVAL HISTORY   Gregory Galvan is here for a follow up of his renal AL amyloidosis and for his next weekly cycle  of treatment.  He presents to the clinic today noting his rash is much better and almost resolved. He notes he still has some itching. He had a cold for 2 weeks with no fever but with chills and body aches. He had significant fatigue. He was given antibiotics which he feels better. He notes to still being congested to where it is hard for him to hear.  He had a chest xray yesterday which showed he had mild pneumonitis. He had treatment last week and would like to continue with it today.     MEDICAL HISTORY:  Past Medical History:  Diagnosis Date  . BPH (benign prostatic hyperplasia)   . Hyperlipidemia   . Hypertension   . Hypogonadism, male   . Pre-diabetes   . Prostatitis   . 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 No surgical procedure performed. History of hypertension for 30 years on ACE inhibitor, beta blocker and Lasix Hypothyroidism   SURGICAL HISTORY: Past Surgical History:  Procedure Laterality Date  . APPENDECTOMY      SOCIAL HISTORY: Social History   Socioeconomic History  . Marital status: Divorced    Spouse name: Not on file  . Number of children: Not on file  . Years of education: Not on file  . Highest education level: Not on file  Social Needs  . Financial resource strain: Not on file  . Food insecurity - worry: Not on file  . Food insecurity - inability: Not on file  . Transportation needs - medical: Not on file  . Transportation needs - non-medical:  Not on file  Occupational History  . Not on file  Tobacco Use  . Smoking status: Former Smoker    Last attempt to quit: 12/22/2013    Years since quitting: 3.1  . Smokeless tobacco: Never Used  Substance and Sexual Activity  . Alcohol use: Yes    Alcohol/week: 16.8 oz    Types: 28 Standard drinks or equivalent per week    Comment: drinks wine  . Drug use: No  . Sexual activity: Not on file  Other Topics Concern  . Not on file  Social History  Narrative  . Not on file    FAMILY HISTORY: Family History  Problem Relation Age of Onset  . Cancer Mother        breast  . Alzheimer's disease Mother   . Hypertension Father   . Heart disease Father   . Lymphoma Father   . Hypertension Brother   . Heart disease Brother   . Heart disease Brother   . Hypertension Brother   . Diabetes Brother     ALLERGIES:  is allergic to ppd [tuberculin purified protein derivative]; sulfonamide derivatives; and penicillins.  MEDICATIONS:  Current Outpatient Medications  Medication Sig Dispense Refill  . acyclovir (ZOVIRAX) 200 MG capsule Take 2 capsules (400 mg total) by mouth daily. This replaces your acyclovir tabs due to allergy concern 60 capsule 1  . ALPRAZolam (XANAX) 0.5 MG tablet take 1/2 to 1 tablet by mouth at bedtime if needed 90 tablet 1  . dexamethasone (DECADRON) 4 MG tablet Take 2 tablets (8 mg total) by mouth daily. Start the day after chemotherapy for 2 days. 30 tablet 1  . ezetimibe (ZETIA) 10 MG tablet Take 1 tablet daily for Cholesterol 90 tablet 1  . furosemide (LASIX) 40 MG tablet 1-2 pills a day for swelling (Patient taking differently: Take 80 mg by mouth daily. ) 60 tablet 11  . latanoprost (XALATAN) 0.005 % ophthalmic solution Place 1 drop into both eyes at bedtime.     [name] levothyroxine (SYNTHROID, LEVOTHROID) 175 MCG tablet take 1 and 1/2 tablets by mouth once daily or as directed 135 tablet 1  . milk thistle 175 MG tablet Take 175 mg by mouth daily.    . Multiple Vitamins-Minerals (MULTIVITAMIN WITH MINERALS) tablet Take 1 tablet by mouth daily.    . ondansetron (ZOFRAN) 8 MG tablet Take 1 tablet (8 mg total) by mouth 2 (two) times daily as needed for refractory nausea / vomiting. Start on day 3 after chemo. 30 tablet 1  . OVER THE COUNTER MEDICATION     . PARoxetine (PAXIL) 20 MG tablet Take 10 mg by mouth daily.    . predniSONE (DELTASONE) 20 MG tablet '60mg'$  po daily with breakfast for 3 days then '40mg'$  po daily x 4  days then prednisone '20mg'$  po daily until reassessment in about 1 week 40 tablet 0  . prochlorperazine (COMPAZINE) 10 MG tablet Take 1 tablet (10 mg total) by mouth every 6 (six) hours as needed (Nausea or vomiting). 30 tablet 1  . Turmeric 500 MG CAPS Take 1,500 mg elemental calcium/kg/hr by mouth daily.    [name] doxycycline (VIBRA-TABS) 100 MG tablet Take 1 tablet (100 mg total) by mouth 2 (two) times daily. 6 tablet 0   No current facility-administered medications for this visit.     REVIEW OF SYSTEMS:    10 Point review of Systems was done is negative except as noted above.  PHYSICAL EXAMINATION: ECOG PERFORMANCE STATUS: 1 - Symptomatic  but completely ambulatory  . Vitals:   02/22/17 1331  BP: (!) 127/96  Pulse: 94  Resp: 20  Temp: 97.9 F (36.6 C)  SpO2: 96%   Filed Weights   02/22/17 1331  Weight: 173 lb 3.2 oz (78.6 kg)   .Body mass index is 26.33 kg/m.  GENERAL:alert, in no acute distress and comfortable SKIN:  Diffuse, scaly rash across the trunk, upper extremities, and face. No concerning lesions.  EYES: conjunctiva are pink and non-injected, sclera anicteric OROPHARYNX: MMM, no exudates, no oropharyngeal erythema or ulceration NECK: supple, no JVD LYMPH:  no palpable lymphadenopathy in the cervical, axillary or inguinal regions LUNGS: clear to auscultation b/l with normal respiratory effort HEART: regular rate & rhythm ABDOMEN:  normoactive bowel sounds , non tender, not distended. No palpable hepatosplenomegaly Extremity: 2+ b/l pitting pedal edema PSYCH: alert & oriented x 3 with fluent speech NEURO: no focal motor/sensory deficits  LABORATORY DATA:  I have reviewed the data as listed  . CBC Latest Ref Rng & Units 02/20/2017 02/15/2017 02/08/2017  WBC 4.0 - 10.3 10e3/uL - 11.3(H) 9.0  Hemoglobin 13.0 - 17.1 g/dL - 13.5 12.7(L)  Hematocrit 38.4 - 49.9 % 40.9 39.5 37.5(L)  Platelets 140 - 400 10e3/uL - 190 182    . CMP Latest Ref Rng & Units 02/20/2017  02/15/2017 01/31/2017  Glucose 70 - 140 mg/dL 93 87 136  BUN 7 - 26 mg/dL 22 27.5(H) 21.9  Creatinine 0.7 - 1.3 mg/dL - 0.9 1.0  Sodium 136 - 145 mmol/L 130(L) 135(L) 133(L)  Potassium 3.5 - 5.1 mmol/L 4.5 4.1 4.2  Chloride 98 - 109 mmol/L 99 - -  CO2 22 - 29 mmol/L _0 Calcium 8.4 - 10.4 mg/dL 8.3(L) 8.8 8.7  Total Protein 6.4 - 8.3 g/dL 5.2(L) 5.5(L) 5.4(L)  Total Bilirubin 0.2 - 1.2 mg/dL 0.4 0.34 0.41  Alkaline Phos 40 - 150 U/L 114 99 100  AST 5 - 34 U/L 49(H) 39(H) 51(H)  ALT 0 - 55 U/L 48 41 37   Component     Latest Ref Rng & Units 10/25/2016 11/22/2016  IgG (Immunoglobin G), Serum     700 - 1,600 mg/dL 496 (L) 308 (L)  IgA/Immunoglobulin A, Serum     61 - 437 mg/dL 76 51 (L)  IgM, Qn, Serum     15 - 143 mg/dL 22 16  Total Protein     6.0 - 8.5 g/dL 4.9 (L) 4.6 (L)  Albumin SerPl Elph-Mcnc     2.9 - 4.4 g/dL 2.6 (L) 2.3 (L)  Alpha 1     0.0 - 0.4 g/dL 0.2 0.2  Alpha2 Glob SerPl Elph-Mcnc     0.4 - 1.0 g/dL 0.9 0.9  B-Globulin SerPl Elph-Mcnc     0.7 - 1.3 g/dL 0.7 0.8  Gamma Glob SerPl Elph-Mcnc     0.4 - 1.8 g/dL 0.4 0.3 (L)  M Protein SerPl Elph-Mcnc     Not Observed g/dL 0.2 (H) 0.1 (H)  Globulin, Total     2.2 - 3.9 g/dL 2.3 2.3  Albumin/Glob SerPl     0.7 - 1.7 1.2 1.1  IFE 1      Comment Comment  Please Note (HCV):      Comment Comment  Ig Kappa Free Light Chain     3.3 - 19.4 mg/L 9.5 7.4  Ig Lambda Free Light Chain     5.7 - 26.3 mg/L 120.9 (H) 66.7 (H)  Kappa/Lambda FluidC Ratio  0.26 - 1.65 0.08 (L) 0.11 (L)           Component     Latest Ref Rng & Units 08/22/2016 10/07/2016  C3 Complement     82 - 185 mg/dL 113   C4 Complement     15 - 53 mg/dL 32   Anit Nuclear Antibody(ANA)     NEGATIVE NEG   RPR     NON REAC NON REAC   Angiotensin-Converting Enzyme     9 - 67 U/L 17   ds DNA Ab     IU/mL 1   ANCA SCREEN      Negative   Hep C Virus Ab     0.0 - 0.9 s/co ratio  0.1   Component     Latest Ref Rng & Units 10/07/2016    Hep C Virus Ab     0.0 - 0.9 s/co ratio 0.1  Hepatitis B Surface Ag     Negative Negative  HIV Screen 4th Generation wRfx     Non Reactive Non Reactive                                   *[place]*                  *[facility]*                          [address]                        [place], [place] [address]                            [phone]  ------------------------------------------------------------------- Transthoracic Echocardiography  Patient:    Gregory Galvan, Gregory Galvan MR #:       [id number] Study Date: 10/12/2016 Gender:     M Age:        73 Height:     172.7 cm Weight:     77.7 kg BSA:        1.94 m^2 Pt. Status: Room:   SONOGRAPHER  Dustin Flock, RCS  PERFORMING   [facility], Outpatient  ATTENDING    [place], [place], [place] [facility]  REFERRING    [place], [place] [facility]  cc:  ------------------------------------------------------------------- LV EF: 65% -   70%  ------------------------------------------------------------------- Indications:      Pre-op evaluation V728.1.  ------------------------------------------------------------------- History:   PMH:  Renal Amyloidosis, Hypertension, pre-chemo.  ------------------------------------------------------------------- Study Conclusions  - Left ventricle: The cavity size was normal. Wall thickness was   increased in a pattern of moderate LVH. Systolic function was   vigorous. The estimated ejection fraction was in the range of 65%   to 70%. Wall motion was normal; there were no regional wall   motion abnormalities. Doppler parameters are consistent with   abnormal left ventricular relaxation (grade 1 diastolic   dysfunction). - Mitral valve: There was trivial regurgitation. - Left atrium: The atrium was mildly dilated.    RADIOGRAPHIC STUDIES:  I have personally reviewed the radiological images as listed and agreed with the findings in the  report. Dg Chest 2 View  Result Date: 02/21/2017 CLINICAL DATA:  Upper respiratory tract infection. EXAM: CHEST  2 VIEW COMPARISON:  Chest x-ray 02/02/2011. FINDINGS: Mediastinum  and hilar structures are normal. Heart size normal. Diffuse bilateral mild interstitial prominence noted. Mild pneumonitis cannot be excluded. No pleural effusion or pneumothorax. Degenerative changes thoracic spine. IMPRESSION: Diffuse mild bilateral interstitial prominence. Mild pneumonitis cannot be excluded. 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  -No hypercalcemia, normal creatinine level, no anemia. -No evidence of bone lesions on whole body skeletal survey. BM Bx- no evidence of myeloma  Echo shows normal ejection fraction with grade 1 diastolic dysfunction.  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  Hepatitis C, HIV and hepatitis B negative. Other workup as per nephrologist negative. Patient follows with Dr. Pearson Grippe  [facility].  #3 Grade 2 macular/desquamative rash over trunk, extremities, and face with no evidence of mucosal involvement. Now worsened since beginning cyclophosphamide after holding. Discontinued cyclophosphamide, and restarted velcade treatment with rash improvement. Rash has now resolved  PLAN  -labs stable: His recent SPEP and SFLC show response to treatment with improvement in M spike from 0.2 to 0.1g/dl and Seru free Lambda light chains from 120 to 66.7 to 49.7.  (01/03/2017) 24h UPEP total protein has decreased from 4.22 to 2.9 g per 24 hours. -Continue cetirizine/Zyrtec 10 mg p.o. Daily -May use additional Benadryl 25 mg p.o. daily every 8 hours as needed for rash and itching. -Continue  famotidine/Pepcid 20 mg p.o. twice daily. -Was advised to use only lukewarm short showers and advised to keep skin well moisturized, and use nonmedicated nondeodorant containing mild soaps. I also encouraged him to add hydrocortisone with this as well.   -We held all treatment medications and permanently discontinued cyclophosphamide since his rash worsened.  -We will continue him on antihistamines/Pepcid to help his rash.  -Continue weekly Velcade + Dexamethasone. -based on next SPEP/SFLC results and his tolerance might need to add alternative medication to Cyclophosphamide - consider Revlimid.  #3 Atypical pneumonia/viral pneumonitis - resolving with Dioxycycline -will finish 10 days of Doxycycline. -He is already on antihistamines, I recommend steam vapors and OTC saline sprays to open his sinuses. ' Chest xray from 02/21/17 showed mild pneumonitis. We will monitor this closely while on Velcade. I strongly encouraged him to notify us if his breathing worsens or he develops a fever as we will need to hold his treatment.    #4 Leg swelling - due to hypoalbuminemia from his nephrotic syndrome + Steroids. Improved some. Plan  -continue diuretics as per nephrologist. Currently taking lasix '80mg'$  po daily. - I also urged him to only consume 48-64oz daily to reduce that amount of fluid he retains.  -minimize salt intake - compression socks given -elevate legs as much as possible. - Swelling much improved, controlled     Refill acyclovir today  Continue weekly Velcade + Dexamethasone Weekly labs RTC with Dr Irene Limbo in 2 weeks   Orders Placed This Encounter  Procedures  . Multiple Myeloma Panel (SPEP&IFE w/QIG)    Standing Status:   Future    Standing Expiration Date:   02/22/2018  . Kappa/lambda light chains    Standing Status:   Future    Standing Expiration Date:   03/29/2018  . 24 Hr Ur UPEP/TP    Standing Status:   Future    Standing Expiration Date:  02/22/2018   All of the patients  questions were answered with apparent satisfaction. The patient knows to call the clinic with any problems, questions or concerns.  I spent 20 minutes counseling the patient face to face. The total time spent in the appointment was 25 minutes and more than 50% was on counseling and direct patient cares.  Sullivan Lone MD [place] AAHIVMS [facility] Hematology/Oncology Physician [facility]  (Office):       [phone] (Work cell):  [phone] (Fax):           [phone]  This document serves as a record of services personally performed by Sullivan Lone, MD. It was created on his behalf by Joslyn Devon, a trained medical scribe. The creation of this record is based on the scribe's personal observations and the provider's statements to them.    .I have reviewed the above documentation for accuracy and completeness, and I agree with the above.'  .Brunetta Genera MD MS

## 2017-02-22 ENCOUNTER — Encounter: Payer: Self-pay | Admitting: Hematology

## 2017-02-22 ENCOUNTER — Inpatient Hospital Stay: Payer: Medicare Other

## 2017-02-22 ENCOUNTER — Inpatient Hospital Stay (HOSPITAL_BASED_OUTPATIENT_CLINIC_OR_DEPARTMENT_OTHER): Payer: Medicare Other | Admitting: Hematology

## 2017-02-22 VITALS — BP 127/96 | HR 94 | Temp 97.9°F | Resp 20 | Ht 68.0 in | Wt 173.2 lb

## 2017-02-22 DIAGNOSIS — L538 Other specified erythematous conditions: Secondary | ICD-10-CM | POA: Diagnosis not present

## 2017-02-22 DIAGNOSIS — M7989 Other specified soft tissue disorders: Secondary | ICD-10-CM | POA: Diagnosis not present

## 2017-02-22 DIAGNOSIS — L308 Other specified dermatitis: Secondary | ICD-10-CM

## 2017-02-22 DIAGNOSIS — E859 Amyloidosis, unspecified: Secondary | ICD-10-CM

## 2017-02-22 DIAGNOSIS — J069 Acute upper respiratory infection, unspecified: Secondary | ICD-10-CM

## 2017-02-22 DIAGNOSIS — E8581 Light chain (AL) amyloidosis: Secondary | ICD-10-CM

## 2017-02-22 MED ORDER — DEXAMETHASONE 4 MG PO TABS
ORAL_TABLET | ORAL | Status: AC
  Filled 2017-02-22: qty 5

## 2017-02-22 MED ORDER — DEXAMETHASONE 4 MG PO TABS
20.0000 mg | ORAL_TABLET | Freq: Once | ORAL | Status: AC
  Administered 2017-02-22: 20 mg via ORAL

## 2017-02-22 MED ORDER — ACYCLOVIR 200 MG PO CAPS: 400.0000 mg | ORAL_CAPSULE | Freq: Every day | ORAL | 1 refills | Status: AC

## 2017-02-22 MED ORDER — ONDANSETRON HCL 8 MG PO TABS
4.0000 mg | ORAL_TABLET | Freq: Once | ORAL | Status: AC
  Administered 2017-02-22: 4 mg via ORAL

## 2017-02-22 MED ORDER — ONDANSETRON HCL 8 MG PO TABS
ORAL_TABLET | ORAL | Status: AC
  Filled 2017-02-22: qty 1

## 2017-02-22 MED ORDER — BORTEZOMIB CHEMO SQ INJECTION 3.5 MG (2.5MG/ML)
1.5000 mg/m2 | Freq: Once | INTRAMUSCULAR | Status: AC
  Administered 2017-02-22: 3 mg via SUBCUTANEOUS
  Filled 2017-02-22: qty 3

## 2017-02-22 NOTE — Patient Instructions (Signed)
[facility] Discharge Instructions for Patients Receiving Chemotherapy  Today you received the following chemotherapy agents Velcade.  To help prevent nausea and vomiting after your treatment, we encourage you to take your nausea medication as directed.  If you develop nausea and vomiting that is not controlled by your nausea medication, call the clinic.   BELOW ARE SYMPTOMS THAT SHOULD BE REPORTED IMMEDIATELY:  *FEVER GREATER THAN 100.5 F  *CHILLS WITH OR WITHOUT FEVER  NAUSEA AND VOMITING THAT IS NOT CONTROLLED WITH YOUR NAUSEA MEDICATION  *UNUSUAL SHORTNESS OF BREATH  *UNUSUAL BRUISING OR BLEEDING  TENDERNESS IN MOUTH AND THROAT WITH OR WITHOUT PRESENCE OF ULCERS  *URINARY PROBLEMS  *BOWEL PROBLEMS  UNUSUAL RASH Items with * indicate a potential emergency and should be followed up as soon as possible.  Feel free to call the clinic should you have any questions or concerns. The clinic phone number is [phone].  Please show the CHEMO ALERT CARD at check-in to the Emergency Department and triage nurse.   

## 2017-02-22 NOTE — Patient Instructions (Signed)
Thank you for choosing [facility] to provide your oncology and hematology care.  To afford each patient quality time with our providers, please arrive 30 minutes before your scheduled appointment time.  If you arrive late for your appointment, you may be asked to reschedule.  We strive to give you quality time with our providers, and arriving late affects you and other patients whose appointments are after yours.   If you are a no show for multiple scheduled visits, you may be dismissed from the clinic at the providers discretion.    Again, thank you for choosing [facility], our hope is that these requests will decrease the amount of time that you wait before being seen by our physicians.  ______________________________________________________________________  Should you have questions after your visit to the [facility], please contact our office at [phone] between the hours of 8:30 and 4:30 p.m.    Voicemails left after 4:30p.m will not be returned until the following business day.    For prescription refill requests, please have your pharmacy contact us directly.  Please also try to allow 48 hours for prescription requests.    Please contact the scheduling department for questions regarding scheduling.  For scheduling of procedures such as PET scans, CT scans, MRI, Ultrasound, etc please contact central scheduling at [phone].    Resources For Cancer Patients and Caregivers:   Oncolink.org:  A wonderful resource for patients and healthcare providers for information regarding your disease, ways to tract your treatment, what to expect, etc.     American Cancer Society:  800-227-2345  Can help patients locate various types of support and financial assistance  Cancer Care: 1-800-813-HOPE (4673) Provides financial assistance, online support groups, medication/co-pay assistance.    [place] DSS:  336-641-3447 Where to apply for food  stamps, Medicaid, and utility assistance  Medicare Rights Center: 800-333-4114 Helps people with Medicare understand their rights and benefits, navigate the Medicare system, and secure the quality healthcare they deserve  SCAT: 336-333-6589 [place] [employer]'s shared-ride transportation service for eligible riders who have a disability that prevents them from riding the fixed route bus.    For additional information on assistance programs please contact our social worker:   Grier Hock/Abigail Elmore:  [phone]            

## 2017-02-24 ENCOUNTER — Telehealth: Payer: Self-pay | Admitting: *Deleted

## 2017-02-24 ENCOUNTER — Telehealth: Payer: Self-pay | Admitting: Hematology

## 2017-02-24 ENCOUNTER — Other Ambulatory Visit: Payer: Self-pay | Admitting: *Deleted

## 2017-02-24 DIAGNOSIS — J069 Acute upper respiratory infection, unspecified: Secondary | ICD-10-CM

## 2017-02-24 MED ORDER — DOXYCYCLINE HYCLATE 100 MG PO TABS: 100.0000 mg | ORAL_TABLET | Freq: Two times a day (BID) | ORAL | 0 refills | Status: AC

## 2017-02-24 NOTE — Telephone Encounter (Signed)
Appt s already scheduled per 1/09 los - no additional appts to add.

## 2017-02-24 NOTE — Telephone Encounter (Signed)
Pt called stating "Dr. Irene Limbo wanted to extend my antibiotic by 3 days"  Reviewed with Dr. Irene Limbo, rx sent for doxycycline BID for an additional 3 days (10 days total).  Pt informed/verbalized understanding.

## 2017-02-27 NOTE — Progress Notes (Addendum)
FOLLOW UP  Assessment and Plan:   Hypertension Well controlled with current medications  Monitor blood pressure at home; patient to call if consistently greater than 130/80 Continue DASH diet.   Reminder to go to the ER if any CP, SOB, nausea, dizziness, severe HA, changes vision/speech, left arm numbness and tingling and jaw pain.  Cholesterol Currently above goal; continue zetia, lifestyle Continue low cholesterol diet and exercise.  Check lipid panel.   Prediabetes Discussed disease and risks Discussed diet/exercise, weight management  Recent A1Cs well controlled; defer; check BMP  Hypothyroidism continue medications the same reminded to take on an empty stomach 30-62mins before food.  check TSH level  Vitamin D Def Above goal at last visit; continue supplementation to maintain goal of 70-100 Check Vit D level  Change in behavior Patient appears sluggish, responses are slow compared to baseline without focal neuological abnormalities Checking electrolytes and CBC today; if abnormal will reach out to Dr. Irene Limbo to discuss diuretic or if possibly related to chemotherapy  ADDENDED: consulted with another provider familiar with patient baseline; reports this is consistent with his baseline for several years.  Continue diet and meds as discussed. Further disposition pending results of labs. Discussed med's effects and SE's.   Over 30 minutes of exam, counseling, chart review, and critical decision making was performed.   Future Appointments  Date Time Provider [place]  03/01/2017  1:30 PM [facility] LAB 2 [facility] None  03/01/2017  2:15 PM [facility] H30 [facility] None  03/08/2017  2:45 PM [facility] LAB 3 [facility] None  03/08/2017  3:00 PM Brunetta Genera, MD [facility] None  03/08/2017  3:30 PM [facility] B6 [facility] None  03/15/2017  2:15 PM [facility] LAB 3 [facility] None  03/15/2017  3:00 PM [facility] A2 [facility] None  03/22/2017  2:45  PM [facility] LAB 3 [facility] None  03/22/2017  3:30 PM [facility] C9 [facility] None  03/29/2017  2:00 PM [facility] LAB 2 [facility] None  03/29/2017  2:45 PM [facility] H29 [facility] None  06/06/2017  2:30 PM Unk Pinto, MD [facility] None  12/21/2017 10:00 AM Unk Pinto, MD [facility] None    ----------------------------------------------------------------------------------------------------------------------  HPI 77 y.o. male  presents for 3 month follow up on hypertension, cholesterol, glucose management, hypothyroid, weight, depression and vitamin D deficiency. 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 He reports he was seen at the cancer center; he had ongoing URI symptoms - CXR was obtained - was dx with pneumonia and is on 10 day course of doxycycline and prednisone taper. He presents somewhat sluggish today, seems HOH compared to baseline.   BMI is Body mass index is 26.3 kg/m., he has not been working on diet and exercise. Wt Readings from Last 3 Encounters:  02/28/17 173 lb (78.5 kg)  02/22/17 173 lb 3.2 oz (78.6 kg)  02/20/17 175 lb 11.2 oz (79.7 kg)   His blood pressure has been controlled at home, today their BP is BP: 118/76  He does not workout. He denies chest pain, shortness of breath, dizziness.   He is on cholesterol medication and denies myalgias. His cholesterol is not at goal. The cholesterol last visit was:   Lab Results  Component Value Date   CHOL 307 (H) 11/18/2016   HDL 92 11/18/2016   LDLCALC 120 (H) 08/15/2016   TRIG 206 (H) 11/18/2016   CHOLHDL 3.3 11/18/2016    He has not been working on diet and exercise for glucose management,  and denies foot ulcerations, increased appetite, paresthesia of the feet, polydipsia, polyuria and visual disturbances. Last A1C in the office was:  Lab Results  Component Value Date   HGBA1C 5.5 11/18/2016   He is on thyroid medication. His medication  was not changed last visit.   Lab Results  Component Value Date   TSH 7.62 (H) 11/18/2016   Patient is on Vitamin D supplement but remained below goal at the last check:   Lab Results  Component Value Date   VD25OH 43 11/18/2016        Current Medications:  Current Outpatient Medications on File Prior to Visit  Medication Sig  . acyclovir (ZOVIRAX) 200 MG capsule Take 2 capsules (400 mg total) by mouth daily. This replaces your acyclovir tabs due to allergy concern  . ALPRAZolam (XANAX) 0.5 MG tablet take 1/2 to 1 tablet by mouth at bedtime if needed  . dexamethasone (DECADRON) 4 MG tablet Take 2 tablets (8 mg total) by mouth daily. Start the day after chemotherapy for 2 days.  [name] doxycycline (VIBRA-TABS) 100 MG tablet Take 1 tablet (100 mg total) by mouth 2 (two) times daily.  [name] ezetimibe (ZETIA) 10 MG tablet Take 1 tablet daily for Cholesterol  . furosemide (LASIX) 40 MG tablet 1-2 pills a day for swelling (Patient taking differently: Take 80 mg by mouth daily. )  . latanoprost (XALATAN) 0.005 % ophthalmic solution Place 1 drop into both eyes at bedtime.   [name] levothyroxine (SYNTHROID, LEVOTHROID) 175 MCG tablet take 1 and 1/2 tablets by mouth once daily or as directed  . milk thistle 175 MG tablet Take 175 mg by mouth daily.  . Multiple Vitamins-Minerals (MULTIVITAMIN WITH MINERALS) tablet Take 1 tablet by mouth daily.  . ondansetron (ZOFRAN) 8 MG tablet Take 1 tablet (8 mg total) by mouth 2 (two) times daily as needed for refractory nausea / vomiting. Start on day 3 after chemo.  [name] OVER THE COUNTER MEDICATION   . PARoxetine (PAXIL) 20 MG tablet Take 10 mg by mouth daily.  . predniSONE (DELTASONE) 20 MG tablet 60mg  po daily with breakfast for 3 days then 40mg  po daily x 4 days then prednisone 20mg  po daily until reassessment in about 1 week  . prochlorperazine (COMPAZINE) 10 MG tablet Take 1 tablet (10 mg total) by mouth every 6 (six) hours as needed (Nausea or vomiting).  . Turmeric  500 MG CAPS Take 1,500 mg elemental calcium/kg/hr by mouth daily.   No current facility-administered medications on file prior to visit.      Allergies:  Allergies  Allergen Reactions  . 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 Sulfonamide Derivatives Itching and Swelling  . Penicillins Swelling and Rash     Medical History:  Past Medical History:  Diagnosis Date  . BPH (benign prostatic hyperplasia)   . Hyperlipidemia   . Hypertension   . Hypogonadism, male   . Pre-diabetes   . Prostatitis   . Thyroid disease    Family history- Reviewed and unchanged Social history- Reviewed and unchanged   Review of Systems:  Review of Systems  Constitutional: Positive for malaise/fatigue. Negative for chills, fever and weight loss.  HENT: Negative for hearing loss and tinnitus.   Eyes: Negative for blurred vision and double vision.  Respiratory: Positive for cough. Negative for sputum production, shortness of breath and wheezing.   Cardiovascular: Negative for chest pain, palpitations, orthopnea, claudication and leg swelling.  Gastrointestinal: Negative for abdominal  pain, blood in stool, constipation, diarrhea, heartburn, melena, nausea and vomiting.  Genitourinary: Negative.   Musculoskeletal: Negative for joint pain and myalgias.  Skin: Negative for rash.  Neurological: Negative for dizziness, tingling, sensory change, weakness and headaches.  Endo/Heme/Allergies: Negative for polydipsia.  Psychiatric/Behavioral: Negative.   All other systems reviewed and are negative.    Physical Exam: BP 118/76   Pulse (!) 112   Temp 98.1 F (36.7 C)   Ht 5\' 8"  (1.727 m)   Wt 173 lb (78.5 kg)   BMI 26.30 kg/m  Wt Readings from Last 3 Encounters:  02/28/17 173 lb (78.5 kg)  02/22/17 173 lb 3.2 oz (78.6 kg)  02/20/17 175 lb 11.2 oz (79.7 kg)   General Appearance: Well nourished, in no apparent distress. Eyes: PERRLA, EOMs, conjunctiva no  swelling or erythema Sinuses: No Frontal/maxillary tenderness ENT/Mouth: Ext aud canals clear, TMs without erythema, bulging. No erythema, swelling, or exudate on post pharynx.  Tonsils not swollen or erythematous. HOH without hearing aids.  Neck: Supple, thyroid normal.  Respiratory: Respiratory effort normal, BS equal bilaterally without rales, rhonchi, wheezing or stridor.  Cardio: Sinus tachy with no MRGs. Brisk upper extremity pulses; bilateral lower extremities with pronounced 4+ pitting edema through ankles and lower legs.  Abdomen: Soft, + BS.  Non tender, no guarding, rebound, hernias, masses. Lymphatics: Non tender without lymphadenopathy.  Musculoskeletal: Full ROM, 5/5 symmetrical strength, Normal slow gait Skin: Warm, dry without rashes, lesions, ecchymosis.  Neuro: Cranial nerves intact. No cerebellar symptoms. No focal deficits. Sensation intact.  Psych: Awake and oriented X 3, normal affect, Insight and Judgment appropriate, though     Izora Ribas, NP 2:48 PM [facility]

## 2017-02-28 ENCOUNTER — Encounter: Payer: Self-pay | Admitting: Adult Health

## 2017-02-28 ENCOUNTER — Ambulatory Visit (INDEPENDENT_AMBULATORY_CARE_PROVIDER_SITE_OTHER): Payer: Medicare Other | Admitting: Adult Health

## 2017-02-28 VITALS — BP 118/76 | HR 112 | Temp 98.1°F | Ht 68.0 in | Wt 173.0 lb

## 2017-02-28 DIAGNOSIS — E039 Hypothyroidism, unspecified: Secondary | ICD-10-CM

## 2017-02-28 DIAGNOSIS — I10 Essential (primary) hypertension: Secondary | ICD-10-CM

## 2017-02-28 DIAGNOSIS — E559 Vitamin D deficiency, unspecified: Secondary | ICD-10-CM | POA: Diagnosis not present

## 2017-02-28 DIAGNOSIS — R4689 Other symptoms and signs involving appearance and behavior: Secondary | ICD-10-CM

## 2017-02-28 DIAGNOSIS — Z79899 Other long term (current) drug therapy: Secondary | ICD-10-CM | POA: Diagnosis not present

## 2017-02-28 DIAGNOSIS — R7309 Other abnormal glucose: Secondary | ICD-10-CM | POA: Diagnosis not present

## 2017-02-28 DIAGNOSIS — E782 Mixed hyperlipidemia: Secondary | ICD-10-CM

## 2017-02-28 DIAGNOSIS — F325 Major depressive disorder, single episode, in full remission: Secondary | ICD-10-CM

## 2017-02-28 NOTE — Patient Instructions (Addendum)
Check at home to see how much lasix   Check weights daily -    Do the following things EVERYDAY: 1) Weigh yourself in the morning before breakfast or at the same time every day. Write it down and keep it in a log. 2) Take your medicines as prescribed 3) Stay as active as you can everyday  Call your doctor if:  Anytime you have any of the following symptoms:  1) 2 pound weight gain in 24 hours or 5 pounds in 1 week  2) shortness of breath, with or without a dry hacking cough  3) swelling in the hands, LEGs, feet or stomach  4) if you have to sleep on extra pillows at night in order to breathe. 5) after laying down at night for 20-30 mins, you wake up short of breath.   These can all be signs of fluid overload.    Get help right away if:  You have trouble breathing.  There is a change in mental status, such as becoming less alert or not being able to focus.  You have chest pain or discomfort.  You faint.   HOME CARE INSTRUCTIONS   Do not stand or sit in one position for long periods of time. Do not sit with your legs crossed. Rest with your legs raised during the day.  Your legs have to be higher than your heart so that gravity will force the valves to open, so please really elevate your legs.   Wear elastic stockings or support hose. Do not wear other tight, encircling garments around the legs, pelvis, or waist.  [employer]  has a wide variety of well priced compression stockings. [place], [place] [address] [id number] [place] has a good cheap selection, I like the socks, they are not as hard to get on  Walk as much as possible to increase blood flow.  Raise the foot of your bed at night with 2-inch blocks. SEEK MEDICAL CARE IF:   The skin around your ankle starts to break down.  You have pain, redness, tenderness, or hard swelling developing in your leg over a vein.  You are uncomfortable due to leg pain. Document Released: 11/10/2004  Document Revised: 04/25/2011 Document Reviewed: 03/29/2010 [facility] Patient Information 2014 [place].

## 2017-03-01 ENCOUNTER — Inpatient Hospital Stay: Payer: Medicare Other

## 2017-03-01 VITALS — BP 121/79 | HR 86 | Temp 98.1°F | Resp 16

## 2017-03-01 DIAGNOSIS — E8581 Light chain (AL) amyloidosis: Secondary | ICD-10-CM

## 2017-03-01 LAB — CMP (CANCER CENTER ONLY)
ALBUMIN: 2 g/dL — AB (ref 3.5–5.0)
ALK PHOS: 88 U/L (ref 40–150)
ALT: 29 U/L (ref 0–55)
ANION GAP: 8 (ref 3–11)
AST: 48 U/L — ABNORMAL HIGH (ref 5–34)
BUN: 17 mg/dL (ref 7–26)
CO2: 22 mmol/L (ref 22–29)
Calcium: 8.1 mg/dL — ABNORMAL LOW (ref 8.4–10.4)
Chloride: 102 mmol/L (ref 98–109)
Creatinine: 0.77 mg/dL (ref 0.70–1.30)
GFR, Est AFR Am: >60 mL/min (ref >60)
GFR, Estimated: >60 mL/min (ref >60)
GLUCOSE: 94 mg/dL (ref 70–140)
POTASSIUM: 4 mmol/L (ref 3.5–5.1)
SODIUM: 132 mmol/L — AB (ref 136–145)
Total Bilirubin: 0.4 mg/dL (ref 0.2–1.2)
Total Protein: 4.9 g/dL — ABNORMAL LOW (ref 6.4–8.3)

## 2017-03-01 LAB — CBC WITH DIFFERENTIAL/PLATELET
BASOS ABS: 22 {cells}/uL (ref 0–200)
Basophils Relative: 0.2 %
Eosinophils Absolute: 143 cells/uL (ref 15–500)
Eosinophils Relative: 1.3 %
HCT: 37.6 % — ABNORMAL LOW (ref 38.5–50.0)
HEMOGLOBIN: 13.3 g/dL (ref 13.2–17.1)
Lymphs Abs: 451 cells/uL — ABNORMAL LOW (ref 850–3900)
MCH: 33 pg (ref 27.0–33.0)
MCHC: 35.4 g/dL (ref 32.0–36.0)
MCV: 93.3 fL (ref 80.0–100.0)
MONOS PCT: 3.5 %
MPV: 11.4 fL (ref 7.5–12.5)
NEUTROS ABS: 9999 {cells}/uL — AB (ref 1500–7800)
NEUTROS PCT: 90.9 %
Platelets: 166 10*3/uL (ref 140–400)
RBC: 4.03 10*6/uL — ABNORMAL LOW (ref 4.20–5.80)
RDW: 13.7 % (ref 11.0–15.0)
TOTAL LYMPHOCYTE: 4.1 %
WBC mixed population: 385 cells/uL (ref 200–950)
WBC: 11 10*3/uL — ABNORMAL HIGH (ref 3.8–10.8)

## 2017-03-01 LAB — BASIC METABOLIC PANEL WITH GFR
BUN: 20 mg/dL (ref 7–25)
CO2: 26 mmol/L (ref 20–32)
CREATININE: 0.9 mg/dL (ref 0.70–1.18)
Calcium: 8.2 mg/dL — ABNORMAL LOW (ref 8.6–10.3)
Chloride: 96 mmol/L — ABNORMAL LOW (ref 98–110)
GFR, Est African American: 97 mL/min/{1.73_m2} (ref >=60)
GFR, Est Non African American: 84 mL/min/{1.73_m2} (ref >=60)
GLUCOSE: 104 mg/dL — AB (ref 65–99)
Potassium: 4.3 mmol/L (ref 3.5–5.3)
SODIUM: 131 mmol/L — AB (ref 135–146)

## 2017-03-01 LAB — CBC WITH DIFFERENTIAL (CANCER CENTER ONLY)
Basophils Absolute: 0 10*3/uL (ref 0.0–0.1)
Basophils Relative: 0 %
Eosinophils Absolute: 0.1 10*3/uL (ref 0.0–0.5)
Eosinophils Relative: 1 %
HEMATOCRIT: 34.4 % — AB (ref 38.4–49.9)
Hemoglobin: 11.8 g/dL — ABNORMAL LOW (ref 13.0–17.1)
LYMPHS ABS: 0.6 10*3/uL — AB (ref 0.9–3.3)
Lymphocytes Relative: 8 %
MCH: 32.6 pg (ref 27.2–33.4)
MCHC: 34.3 g/dL (ref 32.0–36.0)
MCV: 95 fL (ref 79.3–98.0)
MONO ABS: 0.3 10*3/uL (ref 0.1–0.9)
MONOS PCT: 4 %
NEUTROS ABS: 6.2 10*3/uL (ref 1.5–6.5)
Neutrophils Relative %: 87 %
Platelet Count: 150 10*3/uL (ref 140–400)
RBC: 3.62 MIL/uL — ABNORMAL LOW (ref 4.20–5.82)
RDW: 14 % (ref 11.0–15.6)
WBC Count: 7.1 10*3/uL (ref 4.0–10.3)

## 2017-03-01 LAB — TSH: TSH: 2.37 m[IU]/L (ref 0.40–4.50)

## 2017-03-01 LAB — VITAMIN D 25 HYDROXY (VIT D DEFICIENCY, FRACTURES): VIT D 25 HYDROXY: 30 ng/mL (ref 30–100)

## 2017-03-01 LAB — LIPID PANEL
Cholesterol: 156 mg/dL (ref <200)
HDL: 67 mg/dL (ref >40)
LDL Cholesterol (Calc): 64 mg/dL (calc)
Non-HDL Cholesterol (Calc): 89 mg/dL (calc) (ref <130)
Total CHOL/HDL Ratio: 2.3 (calc) (ref <5.0)
Triglycerides: 168 mg/dL — ABNORMAL HIGH (ref <150)

## 2017-03-01 LAB — HEPATIC FUNCTION PANEL
AG RATIO: 1.2 (calc) (ref 1.0–2.5)
ALKALINE PHOSPHATASE (APISO): 95 U/L (ref 40–115)
ALT: 27 U/L (ref 9–46)
AST: 42 U/L — ABNORMAL HIGH (ref 10–35)
Albumin: 2.6 g/dL — ABNORMAL LOW (ref 3.6–5.1)
BILIRUBIN INDIRECT: 0.5 mg/dL (ref 0.2–1.2)
BILIRUBIN TOTAL: 0.6 mg/dL (ref 0.2–1.2)
Bilirubin, Direct: 0.1 mg/dL (ref 0.0–0.2)
Globulin: 2.1 g/dL (calc) (ref 1.9–3.7)
Total Protein: 4.7 g/dL — ABNORMAL LOW (ref 6.1–8.1)

## 2017-03-01 MED ORDER — ONDANSETRON HCL 8 MG PO TABS
ORAL_TABLET | ORAL | Status: AC
  Filled 2017-03-01: qty 1

## 2017-03-01 MED ORDER — DEXAMETHASONE 4 MG PO TABS
20.0000 mg | ORAL_TABLET | Freq: Once | ORAL | Status: AC
  Administered 2017-03-01: 20 mg via ORAL

## 2017-03-01 MED ORDER — BORTEZOMIB CHEMO SQ INJECTION 3.5 MG (2.5MG/ML)
1.5000 mg/m2 | Freq: Once | INTRAMUSCULAR | Status: AC
  Administered 2017-03-01: 3 mg via SUBCUTANEOUS
  Filled 2017-03-01: qty 3

## 2017-03-01 MED ORDER — DEXAMETHASONE 4 MG PO TABS
ORAL_TABLET | ORAL | Status: AC
  Filled 2017-03-01: qty 5

## 2017-03-01 MED ORDER — ONDANSETRON HCL 8 MG PO TABS
4.0000 mg | ORAL_TABLET | Freq: Once | ORAL | Status: AC
  Administered 2017-03-01: 4 mg via ORAL

## 2017-03-01 NOTE — Patient Instructions (Signed)
[facility] Discharge Instructions for Patients Receiving Chemotherapy  Today you received the following chemotherapy agents Velcade.  To help prevent nausea and vomiting after your treatment, we encourage you to take your nausea medication as directed.  If you develop nausea and vomiting that is not controlled by your nausea medication, call the clinic.   BELOW ARE SYMPTOMS THAT SHOULD BE REPORTED IMMEDIATELY:  *FEVER GREATER THAN 100.5 F  *CHILLS WITH OR WITHOUT FEVER  NAUSEA AND VOMITING THAT IS NOT CONTROLLED WITH YOUR NAUSEA MEDICATION  *UNUSUAL SHORTNESS OF BREATH  *UNUSUAL BRUISING OR BLEEDING  TENDERNESS IN MOUTH AND THROAT WITH OR WITHOUT PRESENCE OF ULCERS  *URINARY PROBLEMS  *BOWEL PROBLEMS  UNUSUAL RASH Items with * indicate a potential emergency and should be followed up as soon as possible.  Feel free to call the clinic should you have any questions or concerns. The clinic phone number is [phone].  Please show the CHEMO ALERT CARD at check-in to the Emergency Department and triage nurse.   

## 2017-03-02 LAB — KAPPA/LAMBDA LIGHT CHAINS
KAPPA, LAMDA LIGHT CHAIN RATIO: 0.22 — AB (ref 0.26–1.65)
Kappa free light chain: 8.6 mg/L (ref 3.3–19.4)
Lambda free light chains: 39.4 mg/L — ABNORMAL HIGH (ref 5.7–26.3)

## 2017-03-03 ENCOUNTER — Other Ambulatory Visit: Payer: Self-pay | Admitting: Hematology

## 2017-03-03 ENCOUNTER — Telehealth: Payer: Self-pay

## 2017-03-03 ENCOUNTER — Other Ambulatory Visit: Payer: Self-pay

## 2017-03-03 ENCOUNTER — Ambulatory Visit (HOSPITAL_COMMUNITY)
Admission: RE | Admit: 2017-03-03 | Discharge: 2017-03-03 | Disposition: A | Discharge status: Home / Self Care | Payer: Medicare Other | Source: Ambulatory Visit | Attending: Hematology | Admitting: Hematology

## 2017-03-03 ENCOUNTER — Encounter (HOSPITAL_COMMUNITY): Payer: Self-pay

## 2017-03-03 ENCOUNTER — Inpatient Hospital Stay (HOSPITAL_COMMUNITY)
Admission: EM | Admit: 2017-03-03 | Discharge: 2017-03-17 | DRG: 853 | Disposition: E | Payer: Medicare Other | Attending: Pulmonary Disease | Admitting: Pulmonary Disease

## 2017-03-03 ENCOUNTER — Emergency Department (HOSPITAL_COMMUNITY): Payer: Medicare Other

## 2017-03-03 ENCOUNTER — Other Ambulatory Visit (HOSPITAL_COMMUNITY): Payer: Self-pay

## 2017-03-03 DIAGNOSIS — R7303 Prediabetes: Secondary | ICD-10-CM

## 2017-03-03 DIAGNOSIS — I10 Essential (primary) hypertension: Secondary | ICD-10-CM | POA: Diagnosis present

## 2017-03-03 DIAGNOSIS — R233 Spontaneous ecchymoses: Secondary | ICD-10-CM | POA: Diagnosis present

## 2017-03-03 DIAGNOSIS — H409 Unspecified glaucoma: Secondary | ICD-10-CM | POA: Diagnosis present

## 2017-03-03 DIAGNOSIS — Z87891 Personal history of nicotine dependence: Secondary | ICD-10-CM

## 2017-03-03 DIAGNOSIS — Y95 Nosocomial condition: Secondary | ICD-10-CM | POA: Diagnosis present

## 2017-03-03 DIAGNOSIS — R1310 Dysphagia, unspecified: Secondary | ICD-10-CM | POA: Diagnosis present

## 2017-03-03 DIAGNOSIS — Z515 Encounter for palliative care: Secondary | ICD-10-CM | POA: Diagnosis not present

## 2017-03-03 DIAGNOSIS — R4182 Altered mental status, unspecified: Secondary | ICD-10-CM | POA: Diagnosis not present

## 2017-03-03 DIAGNOSIS — R29728 NIHSS score 28: Secondary | ICD-10-CM | POA: Diagnosis not present

## 2017-03-03 DIAGNOSIS — J84116 Cryptogenic organizing pneumonia: Secondary | ICD-10-CM | POA: Diagnosis present

## 2017-03-03 DIAGNOSIS — R29729 NIHSS score 29: Secondary | ICD-10-CM | POA: Diagnosis not present

## 2017-03-03 DIAGNOSIS — L270 Generalized skin eruption due to drugs and medicaments taken internally: Secondary | ICD-10-CM | POA: Diagnosis not present

## 2017-03-03 DIAGNOSIS — E871 Hypo-osmolality and hyponatremia: Secondary | ICD-10-CM | POA: Diagnosis present

## 2017-03-03 DIAGNOSIS — Z8611 Personal history of tuberculosis: Secondary | ICD-10-CM

## 2017-03-03 DIAGNOSIS — N049 Nephrotic syndrome with unspecified morphologic changes: Secondary | ICD-10-CM | POA: Diagnosis present

## 2017-03-03 DIAGNOSIS — A419 Sepsis, unspecified organism: Secondary | ICD-10-CM | POA: Diagnosis present

## 2017-03-03 DIAGNOSIS — Z4659 Encounter for fitting and adjustment of other gastrointestinal appliance and device: Secondary | ICD-10-CM

## 2017-03-03 DIAGNOSIS — E8581 Light chain (AL) amyloidosis: Secondary | ICD-10-CM

## 2017-03-03 DIAGNOSIS — E119 Type 2 diabetes mellitus without complications: Secondary | ICD-10-CM | POA: Diagnosis present

## 2017-03-03 DIAGNOSIS — K5900 Constipation, unspecified: Secondary | ICD-10-CM | POA: Diagnosis present

## 2017-03-03 DIAGNOSIS — D849 Immunodeficiency, unspecified: Secondary | ICD-10-CM

## 2017-03-03 DIAGNOSIS — E440 Moderate protein-calorie malnutrition: Secondary | ICD-10-CM | POA: Diagnosis present

## 2017-03-03 DIAGNOSIS — H9190 Unspecified hearing loss, unspecified ear: Secondary | ICD-10-CM | POA: Diagnosis present

## 2017-03-03 DIAGNOSIS — J9601 Acute respiratory failure with hypoxia: Secondary | ICD-10-CM | POA: Diagnosis present

## 2017-03-03 DIAGNOSIS — R9401 Abnormal electroencephalogram [EEG]: Secondary | ICD-10-CM | POA: Diagnosis present

## 2017-03-03 DIAGNOSIS — E860 Dehydration: Secondary | ICD-10-CM | POA: Diagnosis present

## 2017-03-03 DIAGNOSIS — J189 Pneumonia, unspecified organism: Secondary | ICD-10-CM

## 2017-03-03 DIAGNOSIS — B459 Cryptococcosis, unspecified: Secondary | ICD-10-CM | POA: Diagnosis not present

## 2017-03-03 DIAGNOSIS — H5702 Anisocoria: Secondary | ICD-10-CM | POA: Diagnosis not present

## 2017-03-03 DIAGNOSIS — E785 Hyperlipidemia, unspecified: Secondary | ICD-10-CM | POA: Diagnosis present

## 2017-03-03 DIAGNOSIS — Z9911 Dependence on respirator [ventilator] status: Secondary | ICD-10-CM | POA: Diagnosis not present

## 2017-03-03 DIAGNOSIS — Z833 Family history of diabetes mellitus: Secondary | ICD-10-CM

## 2017-03-03 DIAGNOSIS — R0902 Hypoxemia: Secondary | ICD-10-CM

## 2017-03-03 DIAGNOSIS — Z880 Allergy status to penicillin: Secondary | ICD-10-CM | POA: Diagnosis not present

## 2017-03-03 DIAGNOSIS — F419 Anxiety disorder, unspecified: Secondary | ICD-10-CM | POA: Diagnosis present

## 2017-03-03 DIAGNOSIS — R0602 Shortness of breath: Secondary | ICD-10-CM

## 2017-03-03 DIAGNOSIS — D899 Disorder involving the immune mechanism, unspecified: Secondary | ICD-10-CM | POA: Diagnosis present

## 2017-03-03 DIAGNOSIS — I639 Cerebral infarction, unspecified: Secondary | ICD-10-CM | POA: Diagnosis not present

## 2017-03-03 DIAGNOSIS — D6959 Other secondary thrombocytopenia: Secondary | ICD-10-CM | POA: Diagnosis present

## 2017-03-03 DIAGNOSIS — E291 Testicular hypofunction: Secondary | ICD-10-CM | POA: Diagnosis present

## 2017-03-03 DIAGNOSIS — N08 Glomerular disorders in diseases classified elsewhere: Secondary | ICD-10-CM | POA: Diagnosis not present

## 2017-03-03 DIAGNOSIS — Y92230 Patient room in hospital as the place of occurrence of the external cause: Secondary | ICD-10-CM | POA: Diagnosis not present

## 2017-03-03 DIAGNOSIS — Z882 Allergy status to sulfonamides status: Secondary | ICD-10-CM

## 2017-03-03 DIAGNOSIS — I700 Atherosclerosis of aorta: Secondary | ICD-10-CM | POA: Diagnosis present

## 2017-03-03 DIAGNOSIS — G9340 Encephalopathy, unspecified: Secondary | ICD-10-CM | POA: Diagnosis not present

## 2017-03-03 DIAGNOSIS — W19XXXA Unspecified fall, initial encounter: Secondary | ICD-10-CM

## 2017-03-03 DIAGNOSIS — W06XXXA Fall from bed, initial encounter: Secondary | ICD-10-CM | POA: Diagnosis not present

## 2017-03-03 DIAGNOSIS — G9349 Other encephalopathy: Secondary | ICD-10-CM | POA: Diagnosis present

## 2017-03-03 DIAGNOSIS — T451X5A Adverse effect of antineoplastic and immunosuppressive drugs, initial encounter: Secondary | ICD-10-CM | POA: Diagnosis present

## 2017-03-03 DIAGNOSIS — R29733 NIHSS score 33: Secondary | ICD-10-CM | POA: Diagnosis not present

## 2017-03-03 DIAGNOSIS — Z8673 Personal history of transient ischemic attack (TIA), and cerebral infarction without residual deficits: Secondary | ICD-10-CM

## 2017-03-03 DIAGNOSIS — J9600 Acute respiratory failure, unspecified whether with hypoxia or hypercapnia: Secondary | ICD-10-CM

## 2017-03-03 DIAGNOSIS — K449 Diaphragmatic hernia without obstruction or gangrene: Secondary | ICD-10-CM | POA: Diagnosis present

## 2017-03-03 DIAGNOSIS — E039 Hypothyroidism, unspecified: Secondary | ICD-10-CM | POA: Diagnosis present

## 2017-03-03 DIAGNOSIS — Z79899 Other long term (current) drug therapy: Secondary | ICD-10-CM

## 2017-03-03 DIAGNOSIS — Z888 Allergy status to other drugs, medicaments and biological substances status: Secondary | ICD-10-CM

## 2017-03-03 DIAGNOSIS — Z8249 Family history of ischemic heart disease and other diseases of the circulatory system: Secondary | ICD-10-CM

## 2017-03-03 DIAGNOSIS — Z79890 Hormone replacement therapy (postmenopausal): Secondary | ICD-10-CM

## 2017-03-03 DIAGNOSIS — I63519 Cerebral infarction due to unspecified occlusion or stenosis of unspecified middle cerebral artery: Secondary | ICD-10-CM | POA: Diagnosis not present

## 2017-03-03 DIAGNOSIS — J158 Pneumonia due to other specified bacteria: Secondary | ICD-10-CM | POA: Diagnosis not present

## 2017-03-03 DIAGNOSIS — R000 Tachycardia, unspecified: Secondary | ICD-10-CM | POA: Diagnosis not present

## 2017-03-03 DIAGNOSIS — Z978 Presence of other specified devices: Secondary | ICD-10-CM

## 2017-03-03 DIAGNOSIS — R918 Other nonspecific abnormal finding of lung field: Secondary | ICD-10-CM

## 2017-03-03 DIAGNOSIS — H5789 Other specified disorders of eye and adnexa: Secondary | ICD-10-CM | POA: Diagnosis not present

## 2017-03-03 DIAGNOSIS — E854 Organ-limited amyloidosis: Secondary | ICD-10-CM | POA: Diagnosis not present

## 2017-03-03 DIAGNOSIS — Z887 Allergy status to serum and vaccine status: Secondary | ICD-10-CM | POA: Diagnosis not present

## 2017-03-03 DIAGNOSIS — I2510 Atherosclerotic heart disease of native coronary artery without angina pectoris: Secondary | ICD-10-CM | POA: Diagnosis present

## 2017-03-03 DIAGNOSIS — K219 Gastro-esophageal reflux disease without esophagitis: Secondary | ICD-10-CM | POA: Diagnosis present

## 2017-03-03 DIAGNOSIS — N400 Enlarged prostate without lower urinary tract symptoms: Secondary | ICD-10-CM | POA: Diagnosis present

## 2017-03-03 DIAGNOSIS — Z7952 Long term (current) use of systemic steroids: Secondary | ICD-10-CM

## 2017-03-03 DIAGNOSIS — Z6825 Body mass index (BMI) 25.0-25.9, adult: Secondary | ICD-10-CM

## 2017-03-03 LAB — CBC WITH DIFFERENTIAL/PLATELET
BASOS PCT: 0 %
Basophils Absolute: 0 10*3/uL (ref 0.0–0.1)
EOS ABS: 0 10*3/uL (ref 0.0–0.7)
Eosinophils Relative: 0 %
HEMATOCRIT: 38.7 % — AB (ref 39.0–52.0)
HEMOGLOBIN: 13.5 g/dL (ref 13.0–17.0)
LYMPHS ABS: 0.2 10*3/uL — AB (ref 0.7–4.0)
Lymphocytes Relative: 2 %
MCH: 33.1 pg (ref 26.0–34.0)
MCHC: 34.9 g/dL (ref 30.0–36.0)
MCV: 94.9 fL (ref 78.0–100.0)
Monocytes Absolute: 0.3 10*3/uL (ref 0.1–1.0)
Monocytes Relative: 3 %
NEUTROS ABS: 11.3 10*3/uL — AB (ref 1.7–7.7)
NEUTROS PCT: 95 %
Platelets: 129 10*3/uL — ABNORMAL LOW (ref 150–400)
RBC: 4.08 MIL/uL — AB (ref 4.22–5.81)
RDW: 14.1 % (ref 11.5–15.5)
WBC: 11.8 10*3/uL — AB (ref 4.0–10.5)

## 2017-03-03 LAB — COMPREHENSIVE METABOLIC PANEL
ALBUMIN: 2.6 g/dL — AB (ref 3.5–5.0)
ALT: 42 U/L (ref 17–63)
ANION GAP: 10 (ref 5–15)
AST: 71 U/L — AB (ref 15–41)
Alkaline Phosphatase: 114 U/L (ref 38–126)
BUN: 24 mg/dL — ABNORMAL HIGH (ref 6–20)
CHLORIDE: 101 mmol/L (ref 101–111)
CO2: 22 mmol/L (ref 22–32)
Calcium: 9 mg/dL (ref 8.9–10.3)
Creatinine, Ser: 1.07 mg/dL (ref 0.61–1.24)
GFR calc Af Amer: >60 mL/min (ref >60)
GFR calc non Af Amer: >60 mL/min (ref >60)
GLUCOSE: 140 mg/dL — AB (ref 65–99)
POTASSIUM: 4.7 mmol/L (ref 3.5–5.1)
Sodium: 133 mmol/L — ABNORMAL LOW (ref 135–145)
TOTAL PROTEIN: 6.1 g/dL — AB (ref 6.5–8.1)

## 2017-03-03 LAB — I-STAT CG4 LACTIC ACID, ED
LACTIC ACID, VENOUS: 1.48 mmol/L (ref 0.5–1.9)
Lactic Acid, Venous: 3.39 mmol/L (ref 0.5–1.9)

## 2017-03-03 LAB — PROTIME-INR
INR: 1.08
Prothrombin Time: 14 seconds (ref 11.4–15.2)

## 2017-03-03 MED ORDER — ALPRAZOLAM 0.5 MG PO TABS: 0.5000 mg | ORAL_TABLET | Freq: Every evening | ORAL | Status: DC | PRN

## 2017-03-03 MED ORDER — PAROXETINE HCL 10 MG PO TABS
10.0000 mg | ORAL_TABLET | Freq: Every day | ORAL | Status: DC
  Administered 2017-03-04 – 2017-03-10 (×6): 10 mg via ORAL
  Filled 2017-03-03 (×8): qty 1

## 2017-03-03 MED ORDER — EZETIMIBE 10 MG PO TABS
10.0000 mg | ORAL_TABLET | Freq: Every day | ORAL | Status: DC
  Administered 2017-03-04 – 2017-03-05 (×2): 10 mg via ORAL
  Filled 2017-03-03 (×4): qty 1

## 2017-03-03 MED ORDER — IOPAMIDOL (ISOVUE-300) INJECTION 61%
INTRAVENOUS | Status: AC
  Administered 2017-03-03: 100 mL
  Filled 2017-03-03: qty 75

## 2017-03-03 MED ORDER — ACYCLOVIR 200 MG PO CAPS
400.0000 mg | ORAL_CAPSULE | Freq: Every day | ORAL | Status: DC
  Administered 2017-03-04 – 2017-03-10 (×6): 400 mg via ORAL
  Filled 2017-03-03 (×7): qty 2

## 2017-03-03 MED ORDER — IPRATROPIUM-ALBUTEROL 0.5-2.5 (3) MG/3ML IN SOLN
3.0000 mL | Freq: Once | RESPIRATORY_TRACT | Status: AC
  Administered 2017-03-03: 3 mL via RESPIRATORY_TRACT
  Filled 2017-03-03: qty 3

## 2017-03-03 MED ORDER — ALBUTEROL SULFATE (2.5 MG/3ML) 0.083% IN NEBU
2.5000 mg | INHALATION_SOLUTION | Freq: Once | RESPIRATORY_TRACT | Status: AC
  Administered 2017-03-03: 2.5 mg via RESPIRATORY_TRACT
  Filled 2017-03-03: qty 3

## 2017-03-03 MED ORDER — LEVOTHYROXINE SODIUM 75 MCG PO TABS
175.0000 ug | ORAL_TABLET | Freq: Every day | ORAL | Status: DC
  Administered 2017-03-04 – 2017-03-10 (×6): 175 ug via ORAL
  Filled 2017-03-03 (×6): qty 1

## 2017-03-03 MED ORDER — LORATADINE 10 MG PO TABS
10.0000 mg | ORAL_TABLET | Freq: Every day | ORAL | Status: DC
  Administered 2017-03-04 – 2017-03-05 (×2): 10 mg via ORAL
  Filled 2017-03-03 (×2): qty 1

## 2017-03-03 MED ORDER — DEXAMETHASONE 4 MG PO TABS: 8.0000 mg | ORAL_TABLET | Freq: Every day | ORAL | Status: DC

## 2017-03-03 MED ORDER — SODIUM CHLORIDE 0.9 % IV BOLUS (SEPSIS)
1000.0000 mL | Freq: Once | INTRAVENOUS | Status: AC
  Administered 2017-03-03: 1000 mL via INTRAVENOUS

## 2017-03-03 MED ORDER — FAMOTIDINE 20 MG PO TABS
10.0000 mg | ORAL_TABLET | Freq: Two times a day (BID) | ORAL | Status: DC
  Administered 2017-03-04 – 2017-03-10 (×12): 10 mg via ORAL
  Filled 2017-03-03 (×12): qty 1

## 2017-03-03 MED ORDER — ALBUTEROL SULFATE (2.5 MG/3ML) 0.083% IN NEBU
2.5000 mg | INHALATION_SOLUTION | RESPIRATORY_TRACT | Status: DC | PRN
  Filled 2017-03-03: qty 3

## 2017-03-03 MED ORDER — IPRATROPIUM-ALBUTEROL 0.5-2.5 (3) MG/3ML IN SOLN
3.0000 mL | Freq: Four times a day (QID) | RESPIRATORY_TRACT | Status: DC
  Administered 2017-03-04 (×4): 3 mL via RESPIRATORY_TRACT
  Filled 2017-03-03 (×3): qty 3

## 2017-03-03 MED ORDER — LATANOPROST 0.005 % OP SOLN
1.0000 [drp] | Freq: Every day | OPHTHALMIC | Status: DC
Start: 2017-03-03 — End: 2017-03-10
  Administered 2017-03-04 – 2017-03-09 (×6): 1 [drp] via OPHTHALMIC
  Filled 2017-03-03 (×2): qty 2.5

## 2017-03-03 MED ORDER — VANCOMYCIN HCL 10 G IV SOLR
1500.0000 mg | Freq: Once | INTRAVENOUS | Status: AC
  Administered 2017-03-03: 1500 mg via INTRAVENOUS
  Filled 2017-03-03: qty 1500

## 2017-03-03 MED ORDER — METHYLPREDNISOLONE SODIUM SUCC 125 MG IJ SOLR
125.0000 mg | Freq: Once | INTRAMUSCULAR | Status: AC
  Administered 2017-03-03: 125 mg via INTRAVENOUS
  Filled 2017-03-03: qty 2

## 2017-03-03 MED ORDER — GUAIFENESIN ER 600 MG PO TB12
600.0000 mg | ORAL_TABLET | Freq: Two times a day (BID) | ORAL | Status: DC
  Administered 2017-03-04 – 2017-03-07 (×6): 600 mg via ORAL
  Filled 2017-03-03 (×6): qty 1

## 2017-03-03 MED ORDER — DEXTROSE 5 % IV SOLN
2.0000 g | Freq: Once | INTRAVENOUS | Status: AC
  Administered 2017-03-03: 2 g via INTRAVENOUS
  Filled 2017-03-03: qty 2

## 2017-03-03 MED ORDER — VANCOMYCIN HCL 10 G IV SOLR
1250.0000 mg | INTRAVENOUS | Status: DC
  Filled 2017-03-03: qty 1250

## 2017-03-03 MED ORDER — LISINOPRIL 2.5 MG PO TABS
5.0000 mg | ORAL_TABLET | Freq: Every day | ORAL | Status: DC
  Administered 2017-03-04 – 2017-03-05 (×2): 5 mg via ORAL
  Filled 2017-03-03 (×2): qty 1
  Filled 2017-03-03: qty 2

## 2017-03-03 MED ORDER — DEXTROSE 5 % IV SOLN
1.0000 g | Freq: Three times a day (TID) | INTRAVENOUS | Status: DC
  Administered 2017-03-04 – 2017-03-10 (×19): 1 g via INTRAVENOUS
  Filled 2017-03-03 (×23): qty 1

## 2017-03-03 NOTE — ED Notes (Signed)
Pt has critical Latic Acid 3.39. Dr, Lacinda Axon made aware.

## 2017-03-03 NOTE — ED Notes (Signed)
Bed: WA07 Expected date:  Expected time:  Means of arrival:  Comments: TR3

## 2017-03-03 NOTE — ED Notes (Signed)
RN AND MD NOTIFIED OF PATIENT'S LACTIC ACID LEVEL OF 3.39 

## 2017-03-03 NOTE — ED Triage Notes (Signed)
Patient has had SOB and coughing x a few weeks. Today, patient was unable to walk without becoming SOB. Patient was sent for a CXR by his physician today and showed worsening pneumonia.

## 2017-03-03 NOTE — Telephone Encounter (Signed)
Pt has noted increasing SOB over the past few weeks. Exact duration unknown. Pt breathing normally at rest, but cannot walk a full block without beginning to pant. Pt was diagnosed with pneumonia and put on doxycylcine. D/t symptoms, chest xray ordered and pt agreed to come in this afternoon to have it completed.  Chest xray resulted. Dr. Irene Limbo to review.

## 2017-03-03 NOTE — ED Provider Notes (Signed)
[facility] DEPT Provider Note   CSN: [id number] Arrival date & time: 03/02/2017  1723     History   Chief Complaint Chief Complaint  Patient presents with  . Shortness of Breath    worsening pneumonia  . Cancer patient    HPI Gregory Galvan is a 77 y.o. male.  Level 5 caveat for urgent need for intervention.  Patient presents with worsening cough and dyspnea over the past 7-10 days.  He has been on doxycycline for "minor pneumonia".  He is currently being treated for AL amyloidosis.  No fever, sweats, chills.  He was evaluated by his primary care physician or oncologist today and instructed to come to the emergency department      Past Medical History:  Diagnosis Date  . BPH (benign prostatic hyperplasia)   . Hyperlipidemia   . Hypertension   . Hypogonadism, male   . Pre-diabetes   . Prostatitis   . Thyroid disease     Patient Active Problem List   Diagnosis Date Noted  . AL amyloidosis ([facility]) 10/12/2016  . Other abnormal glucose 08/19/2015  . Body mass index (BMI) of 24.0-24.9 in adult 11/05/2014  . 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 Glaucoma (increased eye pressure) 07/23/2014  . Medication management 09/19/2013  . Essential hypertension 02/18/2013  . Hypothyroidism 02/18/2013  . Mixed hyperlipidemia 02/18/2013  . Fibromyalgia 02/18/2013  . Vitamin D deficiency 02/18/2013    Past Surgical History:  Procedure Laterality Date  . APPENDECTOMY         Home Medications    Prior to Admission medications   Medication Sig Start Date End Date Taking? Authorizing Provider  acetaminophen (TYLENOL) 325 MG tablet Take 650 mg by mouth every 6 (six) hours as needed for mild pain or moderate pain.   Yes [provider]  acyclovir (ZOVIRAX) 200 MG capsule Take 2 capsules (400 mg total) by mouth daily. This replaces your acyclovir tabs due to allergy concern 02/22/17  Yes Brunetta Genera, MD  ALPRAZolam  Duanne Moron) 0.5 MG tablet take 1/2 to 1 tablet by mouth at bedtime if needed 11/29/16  Yes Unk Pinto, MD  cetirizine (ZYRTEC) 10 MG tablet Take 10 mg by mouth daily.   Yes [provider]  dexamethasone (DECADRON) 4 MG tablet Take 2 tablets (8 mg total) by mouth daily. Start the day after chemotherapy for 2 days. 01/10/17  Yes Brunetta Genera, MD  diphenhydrAMINE (BENADRYL) 25 MG tablet Take 25 mg by mouth at bedtime as needed for sleep.   Yes [provider]  ezetimibe (ZETIA) 10 MG tablet Take 1 tablet daily for Cholesterol 11/19/16 06/19/17 Yes Unk Pinto, MD  famotidine (PEPCID) 10 MG tablet Take 10 mg by mouth 2 (two) times daily.   Yes [provider]  furosemide (LASIX) 40 MG tablet 1-2 pills a day for swelling 08/15/16 08/15/17 Yes Vicie Mutters, PA-C  latanoprost (XALATAN) 0.005 % ophthalmic solution Place 1 drop into both eyes at bedtime.  01/31/13  Yes [provider]  levothyroxine (SYNTHROID, LEVOTHROID) 175 MCG tablet take 1 and 1/2 tablets by mouth once daily or as directed 10/12/16  Yes Unk Pinto, MD  lisinopril (PRINIVIL,ZESTRIL) 5 MG tablet Take 5 mg by mouth at bedtime. 01/19/17  Yes [provider]  Multiple Vitamins-Minerals (MULTIVITAMIN WITH MINERALS) tablet Take 1 tablet by mouth daily.   Yes [provider]  PARoxetine (PAXIL) 20 MG tablet Take 10 mg by mouth daily.   Yes  [provider]  Turmeric 500 MG CAPS Take 1,500 mg elemental calcium/kg/hr by mouth daily.   Yes [provider]  doxycycline (VIBRA-TABS) 100 MG tablet Take 1 tablet (100 mg total) by mouth 2 (two) times daily. 02/24/17   Brunetta Genera, MD  ondansetron (ZOFRAN) 8 MG tablet Take 1 tablet (8 mg total) by mouth 2 (two) times daily as needed for refractory nausea / vomiting. Start on day 3 after chemo. 10/12/16   Brunetta Genera, MD  predniSONE (DELTASONE) 20 MG tablet 60mg  po daily with breakfast for 3 days then  40mg  po daily x 4 days then prednisone 20mg  po daily until reassessment in about 1 week 01/24/17   Brunetta Genera, MD  prochlorperazine (COMPAZINE) 10 MG tablet Take 1 tablet (10 mg total) by mouth every 6 (six) hours as needed (Nausea or vomiting). Patient not taking: Reported on 02/26/2017 10/12/16   Brunetta Genera, MD    Family History Family History  Problem Relation Age of Onset  . Cancer Mother        breast  . Alzheimer's disease Mother   . Hypertension Father   . Heart disease Father   . Lymphoma Father   . Hypertension Brother   . Heart disease Brother   . Heart disease Brother   . Hypertension Brother   . Diabetes Brother     Social History Social History   Tobacco Use  . Smoking status: Former Smoker    Last attempt to quit: 12/22/2013    Years since quitting: 3.1  . Smokeless tobacco: Never Used  Substance Use Topics  . Alcohol use: Yes    Alcohol/week: 16.8 oz    Types: 28 Standard drinks or equivalent per week    Comment: drinks wine  . Drug use: No     Allergies   Ppd [tuberculin purified protein derivative]; Sulfonamide derivatives; and Penicillins   Review of Systems Review of Systems  Unable to perform ROS: Acuity of condition     Physical Exam Updated Vital Signs BP 131/90 (BP Location: Left Arm)   Pulse (!) 107   Temp (!) 97.5 F (36.4 C) (Oral)   Resp (!) 29   Ht 5\' 8"  (1.727 m)   Wt 78.5 kg (173 lb)   SpO2 90%   BMI 26.30 kg/m   Physical Exam  Constitutional: He is oriented to person, place, and time.  Good color, no dyspnea at rest.  HENT:  Head: Normocephalic and atraumatic.  Eyes: Conjunctivae are normal.  Neck: Neck supple.  Cardiovascular: Normal rate and regular rhythm.  Pulmonary/Chest: Effort normal and breath sounds normal.  Decreased breath sounds bilaterally  Abdominal: Soft. Bowel sounds are normal.  Musculoskeletal: Normal range of motion.  Neurological: He is alert and oriented to person, place, and  time.  Skin: Skin is warm and dry.  Psychiatric: He has a normal mood and affect. His behavior is normal.  Nursing note and vitals reviewed.    ED Treatments / Results  Labs (all labs ordered are listed, but only abnormal results are displayed) Labs Reviewed  COMPREHENSIVE METABOLIC PANEL - Abnormal; Notable for the following components:      Result Value   Sodium 133 (*)    Glucose, Bld 140 (*)    BUN 24 (*)    Total Protein 6.1 (*)    Albumin 2.6 (*)    AST 71 (*)    Total Bilirubin <0.1 (*)    All other components within normal  limits  CBC WITH DIFFERENTIAL/PLATELET - Abnormal; Notable for the following components:   WBC 11.8 (*)    RBC 4.08 (*)    HCT 38.7 (*)    Platelets 129 (*)    Neutro Abs 11.3 (*)    Lymphs Abs 0.2 (*)    All other components within normal limits  I-STAT CG4 LACTIC ACID, ED - Abnormal; Notable for the following components:   Lactic Acid, Venous 3.39 (*)    All other components within normal limits  CULTURE, BLOOD (ROUTINE X 2)  CULTURE, BLOOD (ROUTINE X 2)  PROTIME-INR  URINALYSIS, ROUTINE W REFLEX MICROSCOPIC  I-STAT CG4 LACTIC ACID, ED    EKG  EKG Interpretation None       Radiology Dg Chest 2 View  Result Date: 02/27/2017 CLINICAL DATA:  Shortness of breath. EXAM: CHEST  2 VIEW COMPARISON:  02/27/2017 and prior radiographs FINDINGS: Bilateral interstitial and airspace opacities, right greater than left, has not significantly changed from today's radiograph but have increased since 02/20/2017. No pleural effusion or pneumothorax. The cardiomediastinal silhouette is unchanged. IMPRESSION: Bilateral interstitial and airspace opacities suspicious for pneumonia. These opacities have increased since 02/20/2017. Electronically Signed   By: Margarette Canada M.D.   On: 02/18/2017 19:17   Dg Chest 2 View  Result Date: 02/23/2017 CLINICAL DATA:  Shortness of breath EXAM: CHEST  2 VIEW COMPARISON:  02/20/2017, radiograph 02/02/2011 FINDINGS: Interim  development of moderate diffuse right greater than left interstitial and alveolar opacity. No pleural effusion. Stable cardiomediastinal silhouette. No pneumothorax. IMPRESSION: 1. Moderate diffuse right greater than left interstitial and alveolar opacity, could reflect asymmetric edema or pneumonia. Electronically Signed   By: Donavan Foil M.D.   On: 02/24/2017 14:54   Ct Chest W Contrast  Result Date: 02/20/2017 CLINICAL DATA:  77 year old male with history of unresolved pneumonia. Shortness of breath and coughing for the past several weeks. EXAM: CT CHEST WITH CONTRAST TECHNIQUE: Multidetector CT imaging of the chest was performed during intravenous contrast administration. CONTRAST:  181mL ISOVUE-300 IOPAMIDOL (ISOVUE-300) INJECTION 61% COMPARISON:  No prior chest CT.  Chest x-ray 02/28/2017. FINDINGS: Cardiovascular: Heart size is normal. There is no significant pericardial fluid, thickening or pericardial calcification. There is aortic atherosclerosis, as well as atherosclerosis of the great vessels of the mediastinum and the coronary arteries, including calcified atherosclerotic plaque in the left anterior descending, left circumflex and right coronary arteries. Mediastinum/Nodes: No pathologically enlarged mediastinal or hilar lymph nodes. Small hiatal hernia. No axillary lymphadenopathy. Lungs/Pleura: Relatively diffuse but patchy areas of ground-glass attenuation, septal thickening and thickening of the peribronchovascular interstitium are noted scattered asymmetrically throughout the lungs bilaterally. Several areas demonstrate some immediate subpleural sparing, particularly in the periphery of the right lung. No pleural effusions. Upper Abdomen: Aortic atherosclerosis. Musculoskeletal: There are no aggressive appearing lytic or blastic lesions noted in the visualized portions of the skeleton. IMPRESSION: 1. The appearance of the lungs is most compatible with severe multilobar pneumonia. 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 2. Aortic atherosclerosis, in addition to 3 vessel coronary artery disease. Assessment for potential risk factor modification, dietary therapy or pharmacologic therapy may be warranted, if clinically indicated. 3. Small hiatal hernia. Aortic Atherosclerosis (ICD10-I70.0). Electronically Signed   By: Vinnie Langton M.D.   On: 03/15/2017 20:30    Procedures Procedures (including critical care time)  Medications Ordered in ED Medications  vancomycin (VANCOCIN) 1,500 mg in sodium chloride 0.9 % 500 mL IVPB (  1,500 mg Intravenous New Bag/Given 02/15/2017 1929)  ipratropium-albuterol (DUONEB) 0.5-2.5 (3) MG/3ML nebulizer solution 3 mL (not administered)  methylPREDNISolone sodium succinate (SOLU-MEDROL) 125 mg/2 mL injection 125 mg (not administered)  sodium chloride 0.9 % bolus 1,000 mL (1,000 mLs Intravenous New Bag/Given 02/23/2017 1903)  ceFEPIme (MAXIPIME) 2 g in dextrose 5 % 50 mL IVPB (2 g Intravenous New Bag/Given 03/15/2017 1929)  iopamidol (ISOVUE-300) 61 % injection (100 mLs  Contrast Given 02/17/2017 2008)     Initial Impression / Assessment and Plan / ED Course  I have reviewed the triage vital signs and the nursing notes.  Pertinent labs & imaging results that were available during my care of the patient were reviewed by me and considered in my medical decision making (see chart for details).     Patient with AL amyloidosis presents with worsening cough and dyspnea.  CT scan reveals multilobar pneumonia.  Will initiate IV Maxipime, IV vancomycin.  Admit to general medicine.   CRITICAL CARE Performed by: Nat Christen Total critical care time: 30 minutes Critical care time was exclusive of separately billable procedures and treating other patients. Critical care was necessary to treat or prevent imminent or life-threatening deterioration. Critical care was time spent personally  by me on the following activities: development of treatment plan with patient and/or surrogate as well as nursing, discussions with consultants, evaluation of patient's response to treatment, examination of patient, obtaining history from patient or surrogate, ordering and performing treatments and interventions, ordering and review of laboratory studies, ordering and review of radiographic studies, pulse oximetry and re-evaluation of patient's condition.  Final Clinical Impressions(s) / ED Diagnoses   Final diagnoses:  HCAP (healthcare-associated pneumonia)    ED Discharge Orders    None       Nat Christen, MD 02/23/2017 2129

## 2017-03-03 NOTE — ED Notes (Signed)
Pt is alert and oriented x 4 and is verbally responsive pt o2 saturations on 87-90% on 4 LPM of oxygen. Pt  Placed on 6 lmp via nasal cannula and O2 saturation improved to  96%.  pt is breathing unlabored, and speaking in full sentences.

## 2017-03-03 NOTE — Progress Notes (Signed)
Pt. placed on BiPAP V-60 per order, tolerating well, RT to monitor.

## 2017-03-03 NOTE — Progress Notes (Signed)
Pharmacy Antibiotic Note  Gregory Galvan is a 77 y.o. male admitted on 03/02/2017 with pneumonia.   He continues to worsen despite outpatient doxycycline.  PCN allergy noted.  He has tolerated 1st dose of Cefepime in ED without any adverse effects. Pharmacy has been consulted for Vancomycin & Cefepime dosing. 03/07/2017:  Afebrile  Mild leukocytosis (11.8), LA trending down  CXR + worsening PNA  Renal function OK, but mildly elevated above baseline (0.77>>1.07)  Plan: Cefepime 1gm IV q8h Vancomycin 1250mg  IV q24h for target AUC 400-500 Check Vancomycin trough at steady state Monitor renal function and cx data  Consider check MRSA PCR  Height: 5\' 8"  (172.7 cm) Weight: 173 lb (78.5 kg) IBW/kg (Calculated) : 68.4  Temp (24hrs), Avg:97.5 F (36.4 C), Min:97.5 F (36.4 C), Max:97.5 F (36.4 C)  Recent Labs  Lab 02/28/17 1445 03/01/17 1325 03/12/2017 1833 03/01/2017 1854 02/24/2017 2048  WBC 11.0* 7.1 11.8*  --   --   CREATININE 0.90  --  1.07  --   --   LATICACIDVEN  --   --   --  3.39* 1.48    Estimated Creatinine Clearance: 58.6 mL/min (by C-G formula based on SCr of 1.07 mg/dL).    Allergies  Allergen Reactions  . 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 Sulfonamide Derivatives Itching and Swelling  . Penicillins Swelling and Rash    Has patient had a PCN reaction causing immediate rash, facial/tongue/throat swelling, SOB or lightheadedness with hypotension: YES Has patient had a PCN reaction causing severe rash involving mucus membranes or skin necrosis:YES (PENILE SWELLING AND RASH) Has patient had a PCN reaction that required hospitalization:YES Has patient had a PCN reaction occurring within the last 10 years: nO If all of the above answers are "NO", then may proceed with Cephalosporin use.     Antimicrobials this admission: 1/18 Vanc >>  1/18 Cefepime >>   Dose adjustments this admission:  Microbiology results: 1/18  BCx: sent MRSA PCR:   Thank you for allowing pharmacy to be a part of this patient's care.  Biagio Borg 02/21/2017 9:55 PM

## 2017-03-03 NOTE — Telephone Encounter (Signed)
Per Dr. Irene Limbo, chest xray shows potentially worsening pneumonia. Communicated to pt the need to come back to the ED and be evaluated for infection. Spoke with ED charge and that pt has been taking doxycycline for 1 week without improvement. Pt needs to be treated with IV antibiotics and workup including labs (BNP and LDH), CT chest without contrast, and echocardiogram to determine if pt has worsening heart failure.

## 2017-03-03 NOTE — Progress Notes (Signed)
A consult was received from an ED physician for Vancomycin & Cefepime per pharmacy dosing.  The patient's profile has been reviewed for ht/wt/allergies/indication/available labs.   PCN allergy causing penile swelling occurred >66yrs ago noted.  Discussed with Dr Lacinda Axon.  Will proceed with Cefepime & monitor for any adverse events.  A one time order has been placed for Vancomycin 1500mg  IV & Cefepime 2gm IV.  Further antibiotics/pharmacy consults should be ordered by admitting physician if indicated.                       Thank you, Biagio Borg 02/19/2017  6:57 PM

## 2017-03-04 LAB — CBC
HCT: 34.4 % — ABNORMAL LOW (ref 39.0–52.0)
HEMATOCRIT: 34 % — AB (ref 39.0–52.0)
Hemoglobin: 11.6 g/dL — ABNORMAL LOW (ref 13.0–17.0)
Hemoglobin: 11.7 g/dL — ABNORMAL LOW (ref 13.0–17.0)
MCH: 32 pg (ref 26.0–34.0)
MCH: 32.3 pg (ref 26.0–34.0)
MCHC: 33.7 g/dL (ref 30.0–36.0)
MCHC: 34.4 g/dL (ref 30.0–36.0)
MCV: 93.9 fL (ref 78.0–100.0)
MCV: 94.8 fL (ref 78.0–100.0)
PLATELETS: 98 10*3/uL — AB (ref 150–400)
Platelets: 84 10*3/uL — ABNORMAL LOW (ref 150–400)
RBC: 3.63 MIL/uL — ABNORMAL LOW (ref 4.22–5.81)
RBC: 3.62 MIL/uL — ABNORMAL LOW (ref 4.22–5.81)
RDW: 14.1 % (ref 11.5–15.5)
RDW: 14.2 % (ref 11.5–15.5)
WBC: 8.9 10*3/uL (ref 4.0–10.5)
WBC: 9 10*3/uL (ref 4.0–10.5)

## 2017-03-04 LAB — BASIC METABOLIC PANEL
Anion gap: 5 (ref 5–15)
BUN: 21 mg/dL — AB (ref 6–20)
CO2: 22 mmol/L (ref 22–32)
Calcium: 8 mg/dL — ABNORMAL LOW (ref 8.9–10.3)
Chloride: 107 mmol/L (ref 101–111)
Creatinine, Ser: 0.9 mg/dL (ref 0.61–1.24)
GFR calc Af Amer: >60 mL/min (ref >60)
GLUCOSE: 152 mg/dL — AB (ref 65–99)
POTASSIUM: 4.4 mmol/L (ref 3.5–5.1)
Sodium: 134 mmol/L — ABNORMAL LOW (ref 135–145)

## 2017-03-04 LAB — CREATININE, SERUM
Creatinine, Ser: 0.94 mg/dL (ref 0.61–1.24)
GFR calc Af Amer: >60 mL/min (ref >60)
GFR calc non Af Amer: >60 mL/min (ref >60)

## 2017-03-04 LAB — URINALYSIS, ROUTINE W REFLEX MICROSCOPIC
BACTERIA UA: NONE SEEN
Bilirubin Urine: NEGATIVE
GLUCOSE, UA: NEGATIVE mg/dL
HGB URINE DIPSTICK: NEGATIVE
Ketones, ur: NEGATIVE mg/dL
LEUKOCYTES UA: NEGATIVE
NITRITE: NEGATIVE
PH: 6 (ref 5.0–8.0)
PROTEIN: 100 mg/dL — AB
SPECIFIC GRAVITY, URINE: 1.017 (ref 1.005–1.030)
Squamous Epithelial / LPF: NONE SEEN

## 2017-03-04 LAB — CBG MONITORING, ED
GLUCOSE-CAPILLARY: 151 mg/dL — AB (ref 65–99)
Glucose-Capillary: 135 mg/dL — ABNORMAL HIGH (ref 65–99)

## 2017-03-04 LAB — GLUCOSE, CAPILLARY
GLUCOSE-CAPILLARY: 105 mg/dL — AB (ref 65–99)
Glucose-Capillary: 131 mg/dL — ABNORMAL HIGH (ref 65–99)

## 2017-03-04 LAB — MRSA PCR SCREENING: MRSA by PCR: NEGATIVE

## 2017-03-04 MED ORDER — ONDANSETRON HCL 4 MG PO TABS: 4.0000 mg | ORAL_TABLET | Freq: Four times a day (QID) | ORAL | Status: DC | PRN

## 2017-03-04 MED ORDER — SODIUM CHLORIDE 0.9 % IV SOLN
1500.0000 mg | INTRAVENOUS | Status: DC
  Administered 2017-03-04: 1500 mg via INTRAVENOUS
  Filled 2017-03-04 (×2): qty 1500

## 2017-03-04 MED ORDER — ONDANSETRON HCL 4 MG/2ML IJ SOLN: 4.0000 mg | Freq: Four times a day (QID) | INTRAMUSCULAR | Status: DC | PRN

## 2017-03-04 MED ORDER — INSULIN ASPART 100 UNIT/ML ~~LOC~~ SOLN
0.0000 [IU] | Freq: Three times a day (TID) | SUBCUTANEOUS | Status: DC
  Administered 2017-03-04: 2 [IU] via SUBCUTANEOUS
  Administered 2017-03-04 – 2017-03-08 (×9): 1 [IU] via SUBCUTANEOUS
  Administered 2017-03-09 (×3): 3 [IU] via SUBCUTANEOUS
  Administered 2017-03-10: 2 [IU] via SUBCUTANEOUS
  Filled 2017-03-04 (×2): qty 1

## 2017-03-04 MED ORDER — ACETAMINOPHEN 650 MG RE SUPP: 650.0000 mg | Freq: Four times a day (QID) | RECTAL | Status: DC | PRN

## 2017-03-04 MED ORDER — IPRATROPIUM-ALBUTEROL 0.5-2.5 (3) MG/3ML IN SOLN
3.0000 mL | Freq: Three times a day (TID) | RESPIRATORY_TRACT | Status: DC
  Administered 2017-03-05 – 2017-03-10 (×16): 3 mL via RESPIRATORY_TRACT
  Filled 2017-03-04 (×16): qty 3

## 2017-03-04 MED ORDER — ORAL CARE MOUTH RINSE
15.0000 mL | Freq: Two times a day (BID) | OROMUCOSAL | Status: DC
  Administered 2017-03-04 – 2017-03-06 (×3): 15 mL via OROMUCOSAL

## 2017-03-04 MED ORDER — SODIUM CHLORIDE 0.9% FLUSH
3.0000 mL | Freq: Two times a day (BID) | INTRAVENOUS | Status: DC
  Administered 2017-03-04 – 2017-03-09 (×7): 3 mL via INTRAVENOUS
  Administered 2017-03-10: 10 mL via INTRAVENOUS
  Administered 2017-03-11: 3 mL via INTRAVENOUS

## 2017-03-04 MED ORDER — ACETAMINOPHEN 325 MG PO TABS: 650.0000 mg | ORAL_TABLET | Freq: Four times a day (QID) | ORAL | Status: DC | PRN

## 2017-03-04 MED ORDER — ENOXAPARIN SODIUM 40 MG/0.4ML ~~LOC~~ SOLN
40.0000 mg | Freq: Every day | SUBCUTANEOUS | Status: DC
  Administered 2017-03-04 (×2): 40 mg via SUBCUTANEOUS
  Filled 2017-03-04 (×2): qty 0.4

## 2017-03-04 NOTE — ED Notes (Signed)
ED TO INPATIENT HANDOFF REPORT  Name/Age/Gender Gregory Galvan 77 y.o. male  Code Status    Code Status Orders  (From admission, onward)        Start     Ordered   03/04/17 0039  Full code  Continuous     03/04/17 0040    Code Status History    Date Active Date Inactive Code Status Order ID Comments User Context   This patient has a current code status but no historical code status.      Home/SNF/Other Home  Chief Complaint possible pneumonia  Level of Care/Admitting Diagnosis ED Disposition    ED Disposition Condition Comment   Admit  Hospital Area: [place] [[id number]]  Level of Care: Telemetry [5]  Admit to tele based on following criteria: Other see comments  Comments: sepsis  Diagnosis: Sepsis due to pneumonia [facility]) [[id number]]  Admitting Physician: Elwin Mocha [[id number]]  Attending Physician: Elwin Mocha [[id number]]  Estimated length of stay: 3 - 4 days  Certification:: I certify this patient will need inpatient services for at least 2 midnights  PT Class (Do Not Modify): Inpatient [101]  PT Acc Code (Do Not Modify): Private [1]       Medical History Past Medical History:  Diagnosis Date  . BPH (benign prostatic hyperplasia)   . Hyperlipidemia   . Hypertension   . Hypogonadism, male   . Pre-diabetes   . Prostatitis   . Thyroid disease     Allergies Allergies  Allergen Reactions  . 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 Sulfonamide Derivatives Itching and Swelling  . Penicillins Swelling and Rash    Has patient had a PCN reaction causing immediate rash, facial/tongue/throat swelling, SOB or lightheadedness with hypotension: YES Has patient had a PCN reaction causing severe rash involving mucus membranes or skin necrosis:YES (PENILE SWELLING AND RASH) Has patient had a PCN reaction that required hospitalization:YES Has patient had a PCN reaction occurring within the last 10  years: nO If all of the above answers are "NO", then may proceed with Cephalosporin use.     IV Location/Drains/Wounds Patient Lines/Drains/Airways Status   Active Line/Drains/Airways    Name:   Placement date:   Placement time:   Site:   Days:   Peripheral IV 03/08/2017 Right Antecubital   02/17/2017    1845    Antecubital   1   Peripheral IV 02/15/2017 Right Forearm   03/01/2017    1906    Forearm   1   Incision (Closed) 09/26/16 Back Left;Lower   09/26/16    0911     159          Labs/Imaging Results for orders placed or performed during the hospital encounter of 02/24/2017 (from the past 48 hour(s))  Comprehensive metabolic panel     Status: Abnormal   Collection Time: 03/01/2017  6:33 PM  Result Value Ref Range   Sodium 133 (L) 135 - 145 mmol/L   Potassium 4.7 3.5 - 5.1 mmol/L   Chloride 101 101 - 111 mmol/L   CO2 22 22 - 32 mmol/L   Glucose, Bld 140 (H) 65 - 99 mg/dL   BUN 24 (H) 6 - 20 mg/dL   Creatinine, Ser 1.07 0.61 - 1.24 mg/dL   Calcium 9.0 8.9 - 10.3 mg/dL   Total Protein 6.1 (L) 6.5 - 8.1 g/dL   Albumin 2.6 (L) 3.5 - 5.0 g/dL   AST 71 (  H) 15 - 41 U/L   ALT 42 17 - 63 U/L   Alkaline Phosphatase 114 38 - 126 U/L   Total Bilirubin <0.1 (L) 0.3 - 1.2 mg/dL   GFR calc non Af Amer >60 >60 mL/min   GFR calc Af Amer >60 >60 mL/min    Comment: (NOTE) The eGFR has been calculated using the CKD EPI equation. This calculation has not been validated in all clinical situations. eGFR's persistently <60 mL/min signify possible Chronic Kidney Disease.    Anion gap 10 5 - 15  CBC with Differential     Status: Abnormal   Collection Time: 03/13/2017  6:33 PM  Result Value Ref Range   WBC 11.8 (H) 4.0 - 10.5 K/uL   RBC 4.08 (L) 4.22 - 5.81 MIL/uL   Hemoglobin 13.5 13.0 - 17.0 g/dL   HCT 38.7 (L) 39.0 - 52.0 %   MCV 94.9 78.0 - 100.0 fL   MCH 33.1 26.0 - 34.0 pg   MCHC 34.9 30.0 - 36.0 g/dL   RDW 14.1 11.5 - 15.5 %   Platelets 129 (L) 150 - 400 K/uL   Neutrophils Relative % 95 %    Neutro Abs 11.3 (H) 1.7 - 7.7 K/uL   Lymphocytes Relative 2 %   Lymphs Abs 0.2 (L) 0.7 - 4.0 K/uL   Monocytes Relative 3 %   Monocytes Absolute 0.3 0.1 - 1.0 K/uL   Eosinophils Relative 0 %   Eosinophils Absolute 0.0 0.0 - 0.7 K/uL   Basophils Relative 0 %   Basophils Absolute 0.0 0.0 - 0.1 K/uL  Protime-INR     Status: None   Collection Time: 03/07/2017  6:33 PM  Result Value Ref Range   Prothrombin Time 14.0 11.4 - 15.2 seconds   INR 1.08   I-Stat CG4 Lactic Acid, ED     Status: Abnormal   Collection Time: 02/17/2017  6:54 PM  Result Value Ref Range   Lactic Acid, Venous 3.39 (HH) 0.5 - 1.9 mmol/L   Comment NOTIFIED PHYSICIAN   I-Stat CG4 Lactic Acid, ED     Status: None   Collection Time: 03/13/2017  8:48 PM  Result Value Ref Range   Lactic Acid, Venous 1.48 0.5 - 1.9 mmol/L  CBC     Status: Abnormal   Collection Time: 03/04/17 12:43 AM  Result Value Ref Range   WBC 8.9 4.0 - 10.5 K/uL   RBC 3.63 (L) 4.22 - 5.81 MIL/uL   Hemoglobin 11.6 (L) 13.0 - 17.0 g/dL   HCT 34.4 (L) 39.0 - 52.0 %   MCV 94.8 78.0 - 100.0 fL   MCH 32.0 26.0 - 34.0 pg   MCHC 33.7 30.0 - 36.0 g/dL   RDW 14.1 11.5 - 15.5 %   Platelets 98 (L) 150 - 400 K/uL    Comment: REPEATED TO VERIFY SPECIMEN CHECKED FOR CLOTS PLATELET COUNT CONFIRMED BY SMEAR   Creatinine, serum     Status: None   Collection Time: 03/04/17 12:43 AM  Result Value Ref Range   Creatinine, Ser 0.94 0.61 - 1.24 mg/dL   GFR calc non Af Amer >60 >60 mL/min   GFR calc Af Amer >60 >60 mL/min    Comment: (NOTE) The eGFR has been calculated using the CKD EPI equation. This calculation has not been validated in all clinical situations. eGFR's persistently <60 mL/min signify possible Chronic Kidney Disease.   Urinalysis, Routine w reflex microscopic     Status: Abnormal   Collection Time: 03/04/17  1:24  AM  Result Value Ref Range   Color, Urine YELLOW YELLOW   APPearance CLEAR CLEAR   Specific Gravity, Urine 1.017 1.005 - 1.030   pH 6.0  5.0 - 8.0   Glucose, UA NEGATIVE NEGATIVE mg/dL   Hgb urine dipstick NEGATIVE NEGATIVE   Bilirubin Urine NEGATIVE NEGATIVE   Ketones, ur NEGATIVE NEGATIVE mg/dL   Protein, ur 100 (A) NEGATIVE mg/dL   Nitrite NEGATIVE NEGATIVE   Leukocytes, UA NEGATIVE NEGATIVE   RBC / HPF 0-5 0 - 5 RBC/hpf   WBC, UA 0-5 0 - 5 WBC/hpf   Bacteria, UA NONE SEEN NONE SEEN   Squamous Epithelial / LPF NONE SEEN NONE SEEN   Mucus PRESENT    Hyaline Casts, UA PRESENT   Basic metabolic panel     Status: Abnormal   Collection Time: 03/04/17  5:00 AM  Result Value Ref Range   Sodium 134 (L) 135 - 145 mmol/L   Potassium 4.4 3.5 - 5.1 mmol/L   Chloride 107 101 - 111 mmol/L   CO2 22 22 - 32 mmol/L   Glucose, Bld 152 (H) 65 - 99 mg/dL   BUN 21 (H) 6 - 20 mg/dL   Creatinine, Ser 0.90 0.61 - 1.24 mg/dL   Calcium 8.0 (L) 8.9 - 10.3 mg/dL   GFR calc non Af Amer >60 >60 mL/min   GFR calc Af Amer >60 >60 mL/min    Comment: (NOTE) The eGFR has been calculated using the CKD EPI equation. This calculation has not been validated in all clinical situations. eGFR's persistently <60 mL/min signify possible Chronic Kidney Disease.    Anion gap 5 5 - 15  CBC     Status: Abnormal   Collection Time: 03/04/17  5:00 AM  Result Value Ref Range   WBC 9.0 4.0 - 10.5 K/uL   RBC 3.62 (L) 4.22 - 5.81 MIL/uL   Hemoglobin 11.7 (L) 13.0 - 17.0 g/dL   HCT 34.0 (L) 39.0 - 52.0 %   MCV 93.9 78.0 - 100.0 fL   MCH 32.3 26.0 - 34.0 pg   MCHC 34.4 30.0 - 36.0 g/dL   RDW 14.2 11.5 - 15.5 %   Platelets 84 (L) 150 - 400 K/uL    Comment: CONSISTENT WITH PREVIOUS RESULT  CBG monitoring, ED     Status: Abnormal   Collection Time: 03/04/17  8:14 AM  Result Value Ref Range   Glucose-Capillary 135 (H) 65 - 99 mg/dL  CBG monitoring, ED     Status: Abnormal   Collection Time: 03/04/17 12:31 PM  Result Value Ref Range   Glucose-Capillary 151 (H) 65 - 99 mg/dL   Dg Chest 2 View  Result Date: 03/12/2017 CLINICAL DATA:  Shortness of  breath. EXAM: CHEST  2 VIEW COMPARISON:  03/10/2017 and prior radiographs FINDINGS: Bilateral interstitial and airspace opacities, right greater than left, has not significantly changed from today's radiograph but have increased since 02/20/2017. No pleural effusion or pneumothorax. The cardiomediastinal silhouette is unchanged. IMPRESSION: Bilateral interstitial and airspace opacities suspicious for pneumonia. These opacities have increased since 02/20/2017. Electronically Signed   By: Margarette Canada M.D.   On: 03/10/2017 19:17   Dg Chest 2 View  Result Date: 03/07/2017 CLINICAL DATA:  Shortness of breath EXAM: CHEST  2 VIEW COMPARISON:  02/20/2017, radiograph 02/02/2011 FINDINGS: Interim development of moderate diffuse right greater than left interstitial and alveolar opacity. No pleural effusion. Stable cardiomediastinal silhouette. No pneumothorax. IMPRESSION: 1. Moderate diffuse right greater than left interstitial and alveolar  opacity, could reflect asymmetric edema or pneumonia. Electronically Signed   By: Donavan Foil M.D.   On: 02/26/2017 14:54   Ct Chest W Contrast  Result Date: 03/08/2017 CLINICAL DATA:  77 year old male with history of unresolved pneumonia. Shortness of breath and coughing for the past several weeks. EXAM: CT CHEST WITH CONTRAST TECHNIQUE: Multidetector CT imaging of the chest was performed during intravenous contrast administration. CONTRAST:  178m ISOVUE-300 IOPAMIDOL (ISOVUE-300) INJECTION 61% COMPARISON:  No prior chest CT.  Chest x-ray 03/10/2017. FINDINGS: Cardiovascular: Heart size is normal. There is no significant pericardial fluid, thickening or pericardial calcification. There is aortic atherosclerosis, as well as atherosclerosis of the great vessels of the mediastinum and the coronary arteries, including calcified atherosclerotic plaque in the left anterior descending, left circumflex and right coronary arteries. Mediastinum/Nodes: No pathologically enlarged  mediastinal or hilar lymph nodes. Small hiatal hernia. No axillary lymphadenopathy. Lungs/Pleura: Relatively diffuse but patchy areas of ground-glass attenuation, septal thickening and thickening of the peribronchovascular interstitium are noted scattered asymmetrically throughout the lungs bilaterally. Several areas demonstrate some immediate subpleural sparing, particularly in the periphery of the right lung. No pleural effusions. Upper Abdomen: Aortic atherosclerosis. Musculoskeletal: There are no aggressive appearing lytic or blastic lesions noted in the visualized portions of the skeleton. IMPRESSION: 1. The appearance of the lungs is most compatible with severe multilobar pneumonia. 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 2. Aortic atherosclerosis, in addition to 3 vessel coronary artery disease. Assessment for potential risk factor modification, dietary therapy or pharmacologic therapy may be warranted, if clinically indicated. 3. Small hiatal hernia. Aortic Atherosclerosis (ICD10-I70.0). Electronically Signed   By: DVinnie LangtonM.D.   On: 02/19/2017 20:30    Pending Labs Unresulted Labs (From admission, onward)   Start     Ordered   002/20/190500  Creatinine, serum  (enoxaparin (LOVENOX)    CrCl >/= 30 ml/min)  Weekly,   R    Comments:  while on enoxaparin therapy    03/04/17 0040   03/05/17 0500  CBC  Tomorrow morning,   R     03/04/17 1040   03/05/17 00350 Basic metabolic panel  Tomorrow morning,   R     03/04/17 1040   03/04/17 0006  MRSA PCR Screening  Once,   R     03/04/17 0005   03/14/2017 1807  Culture, blood (Routine x 2)  BLOOD CULTURE X 2,   STAT     02/18/2017 1807      Vitals/Pain Today's Vitals   03/04/17 1215 03/04/17 1230 03/04/17 1245 03/04/17 1300  BP: 115/72 105/82 123/79 114/71  Pulse: 98 99 97 (!) 106  Resp: 17 (!) 21 (!) 22 20  Temp:      TempSrc:      SpO2: 95% 96% 95%  94%  Weight:      Height:      PainSc:        Isolation Precautions No active isolations  Medications Medications  ipratropium-albuterol (DUONEB) 0.5-2.5 (3) MG/3ML nebulizer solution 3 mL (3 mLs Nebulization Given 03/04/17 0818)  albuterol (PROVENTIL) (2.5 MG/3ML) 0.083% nebulizer solution 2.5 mg (not administered)  guaiFENesin (MUCINEX) 12 hr tablet 600 mg (600 mg Oral Given 03/04/17 1103)  acyclovir (ZOVIRAX) 200 MG capsule 400 mg (400 mg Oral Given 03/04/17 1103)  ALPRAZolam (XANAX) tablet 0.5 mg (not administered)  loratadine (CLARITIN) tablet 10 mg (10 mg Oral Given 03/04/17 1105)  ezetimibe (ZETIA) tablet  10 mg (10 mg Oral Given 03/04/17 1231)  famotidine (PEPCID) tablet 10 mg (10 mg Oral Given 03/04/17 1105)  latanoprost (XALATAN) 0.005 % ophthalmic solution 1 drop (1 drop Both Eyes Given 03/04/17 0018)  levothyroxine (SYNTHROID, LEVOTHROID) tablet 175 mcg (175 mcg Oral Given 03/04/17 1106)  lisinopril (PRINIVIL,ZESTRIL) tablet 5 mg (0 mg Oral Hold 03/05/2017 2349)  PARoxetine (PAXIL) tablet 10 mg (10 mg Oral Given 03/04/17 1105)  ceFEPIme (MAXIPIME) 1 g in dextrose 5 % 50 mL IVPB (0 g Intravenous Stopped 03/04/17 0910)  insulin aspart (novoLOG) injection 0-9 Units (2 Units Subcutaneous Given 03/04/17 1254)  enoxaparin (LOVENOX) injection 40 mg (40 mg Subcutaneous Given 03/04/17 0244)  sodium chloride flush (NS) 0.9 % injection 3 mL (3 mLs Intravenous Not Given 03/04/17 1106)  acetaminophen (TYLENOL) tablet 650 mg (not administered)    Or  acetaminophen (TYLENOL) suppository 650 mg (not administered)  ondansetron (ZOFRAN) tablet 4 mg (not administered)    Or  ondansetron (ZOFRAN) injection 4 mg (not administered)  vancomycin (VANCOCIN) 1,500 mg in sodium chloride 0.9 % 500 mL IVPB (not administered)  sodium chloride 0.9 % bolus 1,000 mL (0 mLs Intravenous Stopped 03/04/17 0002)  vancomycin (VANCOCIN) 1,500 mg in sodium chloride 0.9 % 500 mL IVPB (0 mg Intravenous Stopped 03/01/2017 2129)   ceFEPIme (MAXIPIME) 2 g in dextrose 5 % 50 mL IVPB (0 g Intravenous Stopped 03/02/2017 2127)  iopamidol (ISOVUE-300) 61 % injection (100 mLs  Contrast Given 02/21/2017 2008)  ipratropium-albuterol (DUONEB) 0.5-2.5 (3) MG/3ML nebulizer solution 3 mL (3 mLs Nebulization Given 02/20/2017 2127)  methylPREDNISolone sodium succinate (SOLU-MEDROL) 125 mg/2 mL injection 125 mg (125 mg Intravenous Given 02/23/2017 2127)  albuterol (PROVENTIL) (2.5 MG/3ML) 0.083% nebulizer solution 2.5 mg (2.5 mg Nebulization Given 03/02/2017 2234)    Mobility walks

## 2017-03-04 NOTE — ED Notes (Signed)
RT removed pt Bipap and transitioned to 7L humidified [place]. Pt maintains sats w/o difficulty

## 2017-03-04 NOTE — ED Notes (Signed)
Bed: RESA Expected date:  Expected time:  Means of arrival:  Comments: Room 7

## 2017-03-04 NOTE — H&P (Signed)
[facility] History and Physical  Gregory Galvan KGY:[id number] DOB: Feb 19, 1940 DOA: 02/22/2017  Referring physician:   PCP: Unk Pinto, MD   Chief Complaint: "My doctor sent me over."  HPI: Gregory Galvan is a 77 y.o. male  W/ pmh of BPH, HTN, Pre DM, Thyroid dz presented with pna. Pt went to onc office for cough sx. CXR showed pna. Started on abx. Given 10d course. Did not improve. Came to ED after onc noted pt was worse and had worsened repeat EKG.  ED Course: Given neb tx. Given vanc and cefepime. Hospitalist consulted for admission.    Review of Systems:  As per HPI otherwise 10 point review of systems negative.    Past Medical History:  Diagnosis Date  . BPH (benign prostatic hyperplasia)   . Hyperlipidemia   . Hypertension   . Hypogonadism, male   . Pre-diabetes   . Prostatitis   . Thyroid disease    Past Surgical History:  Procedure Laterality Date  . APPENDECTOMY     Social History:  reports that he quit smoking about 3 years ago. he has never used smokeless tobacco. He reports that he drinks about 16.8 oz of alcohol per week. He reports that he does not use drugs.  Allergies  Allergen Reactions  . 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 Sulfonamide Derivatives Itching and Swelling  . Penicillins Swelling and Rash    Has patient had a PCN reaction causing immediate rash, facial/tongue/throat swelling, SOB or lightheadedness with hypotension: YES Has patient had a PCN reaction causing severe rash involving mucus membranes or skin necrosis:YES (PENILE SWELLING AND RASH) Has patient had a PCN reaction that required hospitalization:YES Has patient had a PCN reaction occurring within the last 10 years: nO If all of the above answers are "NO", then may proceed with Cephalosporin use.     Family History  Problem Relation Age of Onset  . Cancer Mother        breast  . Alzheimer's disease Mother   .  Hypertension Father   . Heart disease Father   . Lymphoma Father   . Hypertension Brother   . Heart disease Brother   . Heart disease Brother   . Hypertension Brother   . Diabetes Brother      Prior to Admission medications   Medication Sig Start Date End Date Taking? Authorizing Provider  acetaminophen (TYLENOL) 325 MG tablet Take 650 mg by mouth every 6 (six) hours as needed for mild pain or moderate pain.   Yes [provider]  acyclovir (ZOVIRAX) 200 MG capsule Take 2 capsules (400 mg total) by mouth daily. This replaces your acyclovir tabs due to allergy concern 02/22/17  Yes Brunetta Genera, MD  ALPRAZolam Duanne Moron) 0.5 MG tablet take 1/2 to 1 tablet by mouth at bedtime if needed 11/29/16  Yes Unk Pinto, MD  cetirizine (ZYRTEC) 10 MG tablet Take 10 mg by mouth daily.   Yes [provider]  dexamethasone (DECADRON) 4 MG tablet Take 2 tablets (8 mg total) by mouth daily. Start the day after chemotherapy for 2 days. 01/10/17  Yes Brunetta Genera, MD  diphenhydrAMINE (BENADRYL) 25 MG tablet Take 25 mg by mouth at bedtime as needed for sleep.   Yes [provider]  ezetimibe (ZETIA) 10 MG tablet Take 1 tablet daily for Cholesterol 11/19/16 06/19/17 Yes Unk Pinto, MD  famotidine (PEPCID) 10 MG tablet Take 10 mg by mouth 2 (  two) times daily.   Yes [provider]  furosemide (LASIX) 40 MG tablet 1-2 pills a day for swelling 08/15/16 08/15/17 Yes Vicie Mutters, PA-C  latanoprost (XALATAN) 0.005 % ophthalmic solution Place 1 drop into both eyes at bedtime.  01/31/13  Yes [provider]  levothyroxine (SYNTHROID, LEVOTHROID) 175 MCG tablet take 1 and 1/2 tablets by mouth once daily or as directed 10/12/16  Yes Unk Pinto, MD  lisinopril (PRINIVIL,ZESTRIL) 5 MG tablet Take 5 mg by mouth at bedtime. 01/19/17  Yes [provider]  Multiple Vitamins-Minerals (MULTIVITAMIN WITH MINERALS) tablet Take 1 tablet by mouth daily.    Yes [provider]  PARoxetine (PAXIL) 20 MG tablet Take 10 mg by mouth daily.   Yes [provider]  Turmeric 500 MG CAPS Take 1,500 mg elemental calcium/kg/hr by mouth daily.   Yes [provider]  doxycycline (VIBRA-TABS) 100 MG tablet Take 1 tablet (100 mg total) by mouth 2 (two) times daily. 02/24/17   Brunetta Genera, MD  ondansetron (ZOFRAN) 8 MG tablet Take 1 tablet (8 mg total) by mouth 2 (two) times daily as needed for refractory nausea / vomiting. Start on day 3 after chemo. 10/12/16   Brunetta Genera, MD  predniSONE (DELTASONE) 20 MG tablet 60mg  po daily with breakfast for 3 days then 40mg  po daily x 4 days then prednisone 20mg  po daily until reassessment in about 1 week 01/24/17   Brunetta Genera, MD  prochlorperazine (COMPAZINE) 10 MG tablet Take 1 tablet (10 mg total) by mouth every 6 (six) hours as needed (Nausea or vomiting). Patient not taking: Reported on 03/01/2017 10/12/16   Brunetta Genera, MD   Physical Exam: Vitals:   03/12/2017 1945 02/15/2017 2025 03/13/2017 2234 02/26/2017 2352  BP: (!) 128/94 131/90  (!) 112/95  Pulse: (!) 104 (!) 107 94 95  Resp: 20 (!) 29 (!) 30 (!) 32  Temp:      TempSrc:      SpO2: 90% 90% 91% 95%  Weight:      Height:        Wt Readings from Last 3 Encounters:  03/08/2017 78.5 kg (173 lb)  02/28/17 78.5 kg (173 lb)  02/22/17 78.6 kg (173 lb 3.2 oz)    General:  Appears calm and comfortable; A&Ox3 Eyes:  PERRL, EOMI, normal lids, iris ENT:  grossly normal hearing, lips & tongue Neck:  no LAD, masses or thyromegaly Cardiovascular: Resp distress, crackles on R, mild wheeze R, incr wob on venti mask Abdomen:  soft, ntnd Skin:  no rash or induration seen on limited exam Musculoskeletal:  grossly normal tone BUE/BLE Psychiatric:  grossly normal mood and affect, speech fluent and appropriate Neurologic:  CN 2-12 grossly intact, moves all extremities in coordinated fashion.          Labs on Admission:   Basic Metabolic Panel: Recent Labs  Lab 02/28/17 1445 03/01/17 1325 02/28/2017 1833  NA 131* 132* 133*  K 4.3 4.0 4.7  CL 96* 102 101  CO2 26 22 22   GLUCOSE 104* 94 140*  BUN 20 17 24*  CREATININE 0.90  --  1.07  CALCIUM 8.2* 8.1* 9.0   Liver Function Tests: Recent Labs  Lab 02/28/17 1445 03/01/17 1325 03/15/2017 1833  AST 42* 48* 71*  ALT 27 29 42  ALKPHOS  --  88 114  BILITOT 0.6 0.4 <0.1*  PROT 4.7* 4.9* 6.1*  ALBUMIN  --  2.0* 2.6*   No results for input(s):  LIPASE, AMYLASE in the last 168 hours. No results for input(s): AMMONIA in the last 168 hours. CBC: Recent Labs  Lab 02/28/17 1445 03/01/17 1325 03/05/2017 1833  WBC 11.0* 7.1 11.8*  NEUTROABS 9,999* 6.2 11.3*  HGB 13.3  --  13.5  HCT 37.6* 34.4* 38.7*  MCV 93.3 95.0 94.9  PLT 166 150 129*   Cardiac Enzymes: No results for input(s): CKTOTAL, CKMB, CKMBINDEX, TROPONINI in the last 168 hours.  BNP (last 3 results) Recent Labs    08/15/16 1610  BNP 34.1    ProBNP (last 3 results) No results for input(s): PROBNP in the last 8760 hours.   Serum creatinine: 1.07 mg/dL 02/24/2017 1833 Estimated creatinine clearance: 58.6 mL/min  CBG: No results for input(s): GLUCAP in the last 168 hours.  Radiological Exams on Admission: Dg Chest 2 View  Result Date: 03/10/2017 CLINICAL DATA:  Shortness of breath. EXAM: CHEST  2 VIEW COMPARISON:  02/28/2017 and prior radiographs FINDINGS: Bilateral interstitial and airspace opacities, right greater than left, has not significantly changed from today's radiograph but have increased since 02/20/2017. No pleural effusion or pneumothorax. The cardiomediastinal silhouette is unchanged. IMPRESSION: Bilateral interstitial and airspace opacities suspicious for pneumonia. These opacities have increased since 02/20/2017. Electronically Signed   By: Margarette Canada M.D.   On: 02/20/2017 19:17   Dg Chest 2 View  Result Date: 02/28/2017 CLINICAL DATA:  Shortness of breath EXAM: CHEST   2 VIEW COMPARISON:  02/20/2017, radiograph 02/02/2011 FINDINGS: Interim development of moderate diffuse right greater than left interstitial and alveolar opacity. No pleural effusion. Stable cardiomediastinal silhouette. No pneumothorax. IMPRESSION: 1. Moderate diffuse right greater than left interstitial and alveolar opacity, could reflect asymmetric edema or pneumonia. Electronically Signed   By: Donavan Foil M.D.   On: 02/22/2017 14:54   Ct Chest W Contrast  Result Date: 03/16/2017 CLINICAL DATA:  77 year old male with history of unresolved pneumonia. Shortness of breath and coughing for the past several weeks. EXAM: CT CHEST WITH CONTRAST TECHNIQUE: Multidetector CT imaging of the chest was performed during intravenous contrast administration. CONTRAST:  170mL ISOVUE-300 IOPAMIDOL (ISOVUE-300) INJECTION 61% COMPARISON:  No prior chest CT.  Chest x-ray 03/09/2017. FINDINGS: Cardiovascular: Heart size is normal. There is no significant pericardial fluid, thickening or pericardial calcification. There is aortic atherosclerosis, as well as atherosclerosis of the great vessels of the mediastinum and the coronary arteries, including calcified atherosclerotic plaque in the left anterior descending, left circumflex and right coronary arteries. Mediastinum/Nodes: No pathologically enlarged mediastinal or hilar lymph nodes. Small hiatal hernia. No axillary lymphadenopathy. Lungs/Pleura: Relatively diffuse but patchy areas of ground-glass attenuation, septal thickening and thickening of the peribronchovascular interstitium are noted scattered asymmetrically throughout the lungs bilaterally. Several areas demonstrate some immediate subpleural sparing, particularly in the periphery of the right lung. No pleural effusions. Upper Abdomen: Aortic atherosclerosis. Musculoskeletal: There are no aggressive appearing lytic or blastic lesions noted in the visualized portions of the skeleton. IMPRESSION: 1. The appearance of the  lungs is most compatible with severe multilobar pneumonia. 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 2. Aortic atherosclerosis, in addition to 3 vessel coronary artery disease. Assessment for potential risk factor modification, dietary therapy or pharmacologic therapy may be warranted, if clinically indicated. 3. Small hiatal hernia. Aortic Atherosclerosis (ICD10-I70.0). Electronically Signed   By: Vinnie Langton M.D.   On: 03/02/2017 20:30    EKG: Independently reviewed. Sinus tach. 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 No stemi.  Assessment/Plan  Active Problems:   Sepsis due to pneumonia ([place])   Sepsis 2/2 pna Patient hemodynamically stable Presented in acute hypoxic respiratory failure due to outpatient treatment failure for his pneumonia Given Vanc emergency room, will continue Given Cefepime in the emergency room, will continue UA pending Blood cultures 2 pending Patient given 1000 mL of fluid in the emergency room Lactic acid normal now Scheduled DuoNeb's When necessary albuterol Oxygen therapy Bipap ordered Continuous pulse oximetry  Hyponatremia Recheck in AM Likely due to dehydration  Pre DM SSI AC  HLD Cont Zetia  GERD Cont pepcid  Anxiety Cont paxil Prn xanax No SI/HI  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

## 2017-03-04 NOTE — Progress Notes (Signed)
Pharmacy Antibiotic Note  Gregory Galvan is a 77 y.o. male admitted on 03/13/2017 with pneumonia.   He continues to worsen despite outpatient doxycycline.  PCN allergy noted.  He has tolerated 1st dose of Cefepime in ED without any adverse effects. Pharmacy has been consulted for Vancomycin & Cefepime dosing.  Plan: 1) Continue Cefepime 1gm IV q8h 2) Due to improved SCr, will change vanc from 1250mg  to 1500mg  IV q24 (goal AUC 400-500) 3) Monitor renal function and cx data  4) If MRSA PCR neg, recommend discontinue vanc  Height: 5\' 8"  (172.7 cm) Weight: 173 lb (78.5 kg) IBW/kg (Calculated) : 68.4  Temp (24hrs), Avg:97.5 F (36.4 C), Min:97.5 F (36.4 C), Max:97.5 F (36.4 C)  Recent Labs  Lab 02/28/17 1445 03/01/17 1325 03/13/2017 1833 03/02/2017 1854 03/09/2017 2048 03/04/17 0043 03/04/17 0500  WBC 11.0* 7.1 11.8*  --   --  8.9 9.0  CREATININE 0.90  --  1.07  --   --  0.94 0.90  LATICACIDVEN  --   --   --  3.39* 1.48  --   --     Estimated Creatinine Clearance: 69.7 mL/min (by C-G formula based on SCr of 0.9 mg/dL).    Allergies  Allergen Reactions  . 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 Sulfonamide Derivatives Itching and Swelling  . Penicillins Swelling and Rash    Has patient had a PCN reaction causing immediate rash, facial/tongue/throat swelling, SOB or lightheadedness with hypotension: YES Has patient had a PCN reaction causing severe rash involving mucus membranes or skin necrosis:YES (PENILE SWELLING AND RASH) Has patient had a PCN reaction that required hospitalization:YES Has patient had a PCN reaction occurring within the last 10 years: nO If all of the above answers are "NO", then may proceed with Cephalosporin use.     Antimicrobials this admission: 1/18 Vanc >>  1/18 Cefepime >>   Dose adjustments this admission:  Microbiology results: 1/18 BCx: sent 1/18 MRSA PCR: sent  Thank you for allowing pharmacy to  be a part of this patient's care.   Adrian Saran, PharmD, BCPS Pager [phone] 03/04/2017 12:32 PM

## 2017-03-04 NOTE — Progress Notes (Signed)
PROGRESS NOTE    Gregory Galvan  ONG:[id number] DOB: [birth year] DOA: 03/04/2017 PCP: Unk Pinto, MD   Brief Narrative:   Assessment & Plan:   Active Problems:   Sepsis due to pneumonia [facility])  1] acute hypoxic respiratory failure/severe multi lobar pneumonia.-patient failed outpatient treatment with p.o. antibiotics.  Patient is on vancomycin and cefepime at this time when he was admitted he was in severe respiratory distress and was treated with BiPAP.  This morning he is off the BiPAP oxygen is being tapered saturation 96% on 6 L at this time.  I was able to wake him up he is extremely hard of hearing but was able to follow commands and answer appropriately.  Follow-up blood cultures.  Continue SVN treatments around-the-clock.  CT scan of the chest showed The appearance of the lungs is most compatible with severe multilobar pneumonia. 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 2. Aortic atherosclerosis, in addition to 3 vessel coronary artery disease. Assessment for potential risk factor modification, dietary therapy or pharmacologic therapy may be warranted, if clinically indicated. 3. Small hiatal hernia.  2] hyponatremia improving with treatment of pneumonia.  3] hyperlipidemia continue Zetia.  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  6] hypothyroidism continue Synthroid  7] glaucoma continue Xalatan  8] moderate malnutrition consult dietary.  9] hypertension continue lisinopril.  10] thrombocytopenia --platelets have been gradually decreasing in number since yesterday.  His platelet count was normal beginning of January 2019.  Monitor closely as patient is on Lovenox at this time.  It could be related to the antibiotics but I willfollow-up tomorrow. DVT prophylaxis: Lovenox Code Status: Full code Family  Communication: There was a gentleman in the room with him when I was seeing this patient.  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  But I did not discuss any of patient's current issues with him because he is not the POA.  His nephew is his POA. Disposition Plan:  TBD Consultants:  None Procedures: None Antimicrobials: Vanco and cefepime  Subjective: Feels better than yesterday.   Objective: Vitals:   03/04/17 0700 03/04/17 0715 03/04/17 0730 03/04/17 0824  BP: 117/86 126/88 124/89 127/87  Pulse: 86 84 85 92  Resp: (!) 25 (!) 26 (!) 28 14  Temp:      TempSrc:      SpO2: 93% 96% 97% 97%  Weight:      Height:        Intake/Output Summary (Last 24 hours) at 03/04/2017 1025 Last data filed at 03/04/2017 0140 Gross per 24 hour  Intake 1550 ml  Output 600 ml  Net 950 ml   Filed Weights   03/09/2017 1748  Weight: 78.5 kg (173 lb)    Examination:  General exam: Appears calm and comfortable  Respiratory system: Crackles at the right base.  Auscultation. Respiratory effort normal. Cardiovascular system: S1 & S2 heard, RRR. No JVD, murmurs, rubs, gallops or clicks.  Gastrointestinal system: Abdomen is nondistended, soft and nontender. No organomegaly or masses felt. Normal bowel sounds heard. Central nervous system: Alert and oriented. No focal neurological deficits. Extremities 1+ edema. Skin: No rashes, lesions or ulcers Psychiatry: Judgement and insight appear normal. Mood & affect appropriate.     Data Reviewed: I have personally reviewed following labs and imaging studies  CBC: Recent Labs  Lab 02/28/17 1445 03/01/17 1325 02/14/2017 1833 03/04/17 0043 03/04/17  0500  WBC 11.0* 7.1 11.8* 8.9 9.0  NEUTROABS 9,999* 6.2 11.3*  --   --   HGB 13.3  --  13.5 11.6* 11.7*  HCT 37.6* 34.4* 38.7* 34.4* 34.0*  MCV 93.3 95.0 94.9 94.8 93.9  PLT 166 150 129* 98* 84*   Basic Metabolic Panel: Recent Labs  Lab 02/28/17 1445 03/01/17 1325 02/16/2017 1833 03/04/17 0043  03/04/17 0500  NA 131* 132* 133*  --  134*  K 4.3 4.0 4.7  --  4.4  CL 96* 102 101  --  107  CO2 26 22 22   --  22  GLUCOSE 104* 94 140*  --  152*  BUN 20 17 24*  --  21*  CREATININE 0.90  --  1.07 0.94 0.90  CALCIUM 8.2* 8.1* 9.0  --  8.0*   GFR: Estimated Creatinine Clearance: 69.7 mL/min (by C-G formula based on SCr of 0.9 mg/dL). Liver Function Tests: Recent Labs  Lab 02/28/17 1445 03/01/17 1325 02/18/2017 1833  AST 42* 48* 71*  ALT 27 29 42  ALKPHOS  --  88 114  BILITOT 0.6 0.4 <0.1*  PROT 4.7* 4.9* 6.1*  ALBUMIN  --  2.0* 2.6*   No results for input(s): LIPASE, AMYLASE in the last 168 hours. No results for input(s): AMMONIA in the last 168 hours. Coagulation Profile: Recent Labs  Lab 02/17/2017 1833  INR 1.08   Cardiac Enzymes: No results for input(s): CKTOTAL, CKMB, CKMBINDEX, TROPONINI in the last 168 hours. BNP (last 3 results) No results for input(s): PROBNP in the last 8760 hours. HbA1C: No results for input(s): HGBA1C in the last 72 hours. CBG: Recent Labs  Lab 03/04/17 0814  GLUCAP 135*   Lipid Profile: No results for input(s): CHOL, HDL, LDLCALC, TRIG, CHOLHDL, LDLDIRECT in the last 72 hours. Thyroid Function Tests: No results for input(s): TSH, T4TOTAL, FREET4, T3FREE, THYROIDAB in the last 72 hours. Anemia Panel: No results for input(s): VITAMINB12, FOLATE, FERRITIN, TIBC, IRON, RETICCTPCT in the last 72 hours. Sepsis Labs: Recent Labs  Lab 02/15/2017 1854 03/08/2017 2048  LATICACIDVEN 3.39* 1.48    No results found for this or any previous visit (from the past 240 hour(s)).       Radiology Studies: Dg Chest 2 View  Result Date: 02/22/2017 CLINICAL DATA:  Shortness of breath. EXAM: CHEST  2 VIEW COMPARISON:  03/08/2017 and prior radiographs FINDINGS: Bilateral interstitial and airspace opacities, right greater than left, has not significantly changed from today's radiograph but have increased since 02/20/2017. No pleural effusion or  pneumothorax. The cardiomediastinal silhouette is unchanged. IMPRESSION: Bilateral interstitial and airspace opacities suspicious for pneumonia. These opacities have increased since 02/20/2017. Electronically Signed   By: Margarette Canada M.D.   On: 02/16/2017 19:17   Dg Chest 2 View  Result Date: 03/02/2017 CLINICAL DATA:  Shortness of breath EXAM: CHEST  2 VIEW COMPARISON:  02/20/2017, radiograph 02/02/2011 FINDINGS: Interim development of moderate diffuse right greater than left interstitial and alveolar opacity. No pleural effusion. Stable cardiomediastinal silhouette. No pneumothorax. IMPRESSION: 1. Moderate diffuse right greater than left interstitial and alveolar opacity, could reflect asymmetric edema or pneumonia. Electronically Signed   By: Donavan Foil M.D.   On: 02/28/2017 14:54   Ct Chest W Contrast  Result Date: 03/02/2017 CLINICAL DATA:  77 year old male with history of unresolved pneumonia. Shortness of breath and coughing for the past several weeks. EXAM: CT CHEST WITH CONTRAST TECHNIQUE: Multidetector CT imaging of the chest was performed during intravenous contrast administration. CONTRAST:  120mL ISOVUE-300 IOPAMIDOL (ISOVUE-300) INJECTION 61% COMPARISON:  No prior chest CT.  Chest x-ray 02/17/2017. FINDINGS: Cardiovascular: Heart size is normal. There is no significant pericardial fluid, thickening or pericardial calcification. There is aortic atherosclerosis, as well as atherosclerosis of the great vessels of the mediastinum and the coronary arteries, including calcified atherosclerotic plaque in the left anterior descending, left circumflex and right coronary arteries. Mediastinum/Nodes: No pathologically enlarged mediastinal or hilar lymph nodes. Small hiatal hernia. No axillary lymphadenopathy. Lungs/Pleura: Relatively diffuse but patchy areas of ground-glass attenuation, septal thickening and thickening of the peribronchovascular interstitium are noted scattered asymmetrically  throughout the lungs bilaterally. Several areas demonstrate some immediate subpleural sparing, particularly in the periphery of the right lung. No pleural effusions. Upper Abdomen: Aortic atherosclerosis. Musculoskeletal: There are no aggressive appearing lytic or blastic lesions noted in the visualized portions of the skeleton. IMPRESSION: 1. The appearance of the lungs is most compatible with severe multilobar pneumonia. 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 2. Aortic atherosclerosis, in addition to 3 vessel coronary artery disease. Assessment for potential risk factor modification, dietary therapy or pharmacologic therapy may be warranted, if clinically indicated. 3. Small hiatal hernia. Aortic Atherosclerosis (ICD10-I70.0). Electronically Signed   By: Vinnie Langton M.D.   On: 03/04/2017 20:30        Scheduled Meds: . acyclovir  400 mg Oral Daily  . enoxaparin (LOVENOX) injection  40 mg Subcutaneous QHS  . ezetimibe  10 mg Oral Daily  . famotidine  10 mg Oral BID  . guaiFENesin  600 mg Oral BID  . insulin aspart  0-9 Units Subcutaneous TID WC  . ipratropium-albuterol  3 mL Nebulization Q6H  . latanoprost  1 drop Both Eyes QHS  . levothyroxine  175 mcg Oral QAC breakfast  . lisinopril  5 mg Oral QHS  . loratadine  10 mg Oral Daily  . PARoxetine  10 mg Oral Daily  . sodium chloride flush  3 mL Intravenous Q12H   Continuous Infusions: . ceFEPime (MAXIPIME) IV Stopped (03/04/17 0910)  . vancomycin       LOS: 1 day    Georgette Shell, MD [facility] If 7PM-7AM, please contact night-coverage [email or web address] Password [facility] 03/04/2017, 10:25 AM

## 2017-03-04 NOTE — ED Notes (Signed)
Pt 84% on [place]. Aggie Moats, MD informed.

## 2017-03-05 ENCOUNTER — Inpatient Hospital Stay (HOSPITAL_COMMUNITY): Payer: Medicare Other

## 2017-03-05 DIAGNOSIS — E8809 Other disorders of plasma-protein metabolism, not elsewhere classified: Secondary | ICD-10-CM | POA: Insufficient documentation

## 2017-03-05 DIAGNOSIS — D899 Disorder involving the immune mechanism, unspecified: Secondary | ICD-10-CM

## 2017-03-05 DIAGNOSIS — J189 Pneumonia, unspecified organism: Secondary | ICD-10-CM

## 2017-03-05 DIAGNOSIS — J9601 Acute respiratory failure with hypoxia: Secondary | ICD-10-CM | POA: Diagnosis present

## 2017-03-05 DIAGNOSIS — D849 Immunodeficiency, unspecified: Secondary | ICD-10-CM

## 2017-03-05 DIAGNOSIS — E854 Organ-limited amyloidosis: Secondary | ICD-10-CM

## 2017-03-05 DIAGNOSIS — N08 Glomerular disorders in diseases classified elsewhere: Secondary | ICD-10-CM

## 2017-03-05 DIAGNOSIS — L270 Generalized skin eruption due to drugs and medicaments taken internally: Secondary | ICD-10-CM

## 2017-03-05 LAB — LACTATE DEHYDROGENASE: LDH: 910 U/L — ABNORMAL HIGH (ref 98–192)

## 2017-03-05 LAB — BLOOD GAS, ARTERIAL
Acid-base deficit: 0.5 mmol/L (ref 0.0–2.0)
Bicarbonate: 22.1 mmol/L (ref 20.0–28.0)
Drawn by: 270211
FIO2: 1
O2 SAT: 92.9 %
PCO2 ART: 31.2 mmHg — AB (ref 32.0–48.0)
Patient temperature: 98.6
pH, Arterial: 7.465 — ABNORMAL HIGH (ref 7.350–7.450)
pO2, Arterial: 68.4 mmHg — ABNORMAL LOW (ref 83.0–108.0)

## 2017-03-05 LAB — BASIC METABOLIC PANEL
Anion gap: 4 — ABNORMAL LOW (ref 5–15)
BUN: 21 mg/dL — ABNORMAL HIGH (ref 6–20)
CO2: 24 mmol/L (ref 22–32)
Calcium: 8.1 mg/dL — ABNORMAL LOW (ref 8.9–10.3)
Chloride: 107 mmol/L (ref 101–111)
Creatinine, Ser: 0.73 mg/dL (ref 0.61–1.24)
GFR calc Af Amer: >60 mL/min (ref >60)
GFR calc non Af Amer: >60 mL/min (ref >60)
Glucose, Bld: 89 mg/dL (ref 65–99)
Potassium: 3.9 mmol/L (ref 3.5–5.1)
Sodium: 135 mmol/L (ref 135–145)

## 2017-03-05 LAB — CBC
HCT: 33.5 % — ABNORMAL LOW (ref 39.0–52.0)
Hemoglobin: 11.1 g/dL — ABNORMAL LOW (ref 13.0–17.0)
MCH: 31.9 pg (ref 26.0–34.0)
MCHC: 33.1 g/dL (ref 30.0–36.0)
MCV: 96.3 fL (ref 78.0–100.0)
Platelets: 72 10*3/uL — ABNORMAL LOW (ref 150–400)
RBC: 3.48 MIL/uL — ABNORMAL LOW (ref 4.22–5.81)
RDW: 14.4 % (ref 11.5–15.5)
WBC: 9.7 10*3/uL (ref 4.0–10.5)

## 2017-03-05 LAB — SEDIMENTATION RATE: SED RATE: 54 mm/h — AB (ref 0–16)

## 2017-03-05 LAB — GLUCOSE, CAPILLARY
GLUCOSE-CAPILLARY: 100 mg/dL — AB (ref 65–99)
GLUCOSE-CAPILLARY: 144 mg/dL — AB (ref 65–99)
Glucose-Capillary: 82 mg/dL (ref 65–99)
Glucose-Capillary: 144 mg/dL — ABNORMAL HIGH (ref 65–99)

## 2017-03-05 LAB — INFLUENZA PANEL BY PCR (TYPE A & B)
INFLBPCR: NEGATIVE
Influenza A By PCR: NEGATIVE

## 2017-03-05 LAB — PROCALCITONIN: PROCALCITONIN: 0.18 ng/mL

## 2017-03-05 MED ORDER — CLINDAMYCIN PHOSPHATE 600 MG/50ML IV SOLN
600.0000 mg | Freq: Four times a day (QID) | INTRAVENOUS | Status: DC
  Filled 2017-03-05: qty 50

## 2017-03-05 MED ORDER — SODIUM CHLORIDE 3 % IN NEBU
4.0000 mL | INHALATION_SOLUTION | Freq: Four times a day (QID) | RESPIRATORY_TRACT | Status: AC | PRN
  Filled 2017-03-05: qty 4

## 2017-03-05 MED ORDER — VANCOMYCIN HCL 10 G IV SOLR
1500.0000 mg | INTRAVENOUS | Status: DC
  Administered 2017-03-05 – 2017-03-07 (×3): 1500 mg via INTRAVENOUS
  Filled 2017-03-05 (×4): qty 1500

## 2017-03-05 MED ORDER — METHYLPREDNISOLONE SODIUM SUCC 125 MG IJ SOLR
60.0000 mg | Freq: Four times a day (QID) | INTRAMUSCULAR | Status: DC
  Administered 2017-03-05 – 2017-03-10 (×20): 60 mg via INTRAVENOUS
  Filled 2017-03-05 (×20): qty 2

## 2017-03-05 MED ORDER — PRIMAQUINE PHOSPHATE 26.3 MG PO TABS
30.0000 mg | ORAL_TABLET | Freq: Every day | ORAL | Status: DC
  Filled 2017-03-05: qty 2

## 2017-03-05 NOTE — Progress Notes (Signed)
Patient now placed on Venti mask at 55%, sats 96% but desat to 87%. Placed back on rebreather mask, sat back up to 94%, HR 99. Patient resting. RT to leave rebreather in place for now. Will c/t monitor.

## 2017-03-05 NOTE — Progress Notes (Signed)
Patient  found on floor laying on his left side at 2115 immediatly  after a shout from the room.   Patient is alert and oriented, helped patient back in bed with maxisky lift. , neuro checks normal, has small abrasion on left lateral lower anterior arm, cleaned and covered with gauze, a small mark on left anterior thigh, cleaned and covered with gauze and small abrasion behind the left ear OTA. Patient denied hitting body part but did c/o frontal headache but is able to move all extremities without pain. Vitals are stable, [place] paged, Ct head and L shoulder x ray orders received. Patient tolerated scan well, paged [facility] NP regarding results. Patient orient to room again, call bell in reach. Bed alarm on, floor matt in place.  Binnie Kand called and left massage on voice mail. Patient resting,   Will continue to monitor.

## 2017-03-05 NOTE — Progress Notes (Signed)
Patient desat to 76%, states he feels like he had a panic attack. Respirations slightly labored. Patient on 7L and pushed to 8 with no change. Patient up to 77%, paged RT to assist. RT arrived immediately, recheck sats at 73%, bumped up to 10L with sats to 82%. RT paged Rapid Response to check on patient. RT placed patient on re-breather mask. Patient is alert, oriented and able to answer questions. Sats up to 97%. Patient states feeling better with mask. Will c/t monitor. RT at bedside.

## 2017-03-05 NOTE — Progress Notes (Signed)
Called to patient room for oxygen desaturation of 73% on 7 liter supplemental O2 via HFNC (salter cannula). Changed to Venturi mask .55 FiO2 via venturi air entrainment mask. SpO2 remained 87. Placed on 1.0 NRB for SpO2 97%. Oxygen then weaned to a partial rebreathing mask to maintain O2 sats 92 or greater. RN at bedside and aware of transitioning of oxygen. Patient alert and oriented. States the mask "feels better". VSS upon exit of room.

## 2017-03-05 NOTE — Progress Notes (Signed)
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  He was treated as an outpatient with doxycycline which did not improve his symptoms.  He was seen by his oncologist  on the day of admission and was told to come to the ER.  In addition to that he has a history of hypertension hyperlipidemia BPH and hypothyroidism.  Patient was initially treated with BiPAP and was titrated to nasal cannula.  But overnight again patient desaturated and was placed on a nonrebreather mask. 1/20 patient on nonrebreather mask satting at 90%.  CT scan of the chest done which shows1. The appearance of the lungs is most compatible with severe multilobar pneumonia. 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 2. Aortic atherosclerosis, in addition to 3 vessel coronary artery disease. Assessment for potential risk factor modification, dietary therapy or pharmacologic therapy may be warranted, if clinically indicated. 3. Small hiatal hernia. Assessment & Plan:   Active Problems:   Sepsis due to pneumonia [facility]) 1] acute hypoxic respiratory failure/severe multi lobar pneumonia.-patient failed outpatient treatment with p.o. antibiotics.  Will DC vancomycin since MRSA PCR is negative.  Continue cefepime.  Follow-up cultures.  I will start the patient on IV Solu-Medrol secondary toCOP evidenced in the CT scan.  Follow-up blood cultures.  Continue SVN treatments around-the-clock.  CT scan of the chest showed The appearance of the lungs is most compatible with severe multilobar pneumonia. 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 2. Aortic atherosclerosis, in addition to 3 vessel coronary artery disease.  Assessment for potential risk factor modification, dietary therapy or pharmacologic therapy may be warranted, if clinically indicated. 3. Small hiatal hernia.  2] hyponatremia improving with treatment of pneumonia.  3] hyperlipidemia continue Zetia.  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  6] hypothyroidism continue Synthroid  7] glaucoma continue Xalatan  8] moderate malnutrition consult dietary.  9] hypertension continue lisinopril.  10] thrombocytopenia -platelets have dropped since yesterday.  Will DC his Lovenox and put him on SCDs.       DVT prophylaxis:LOVENOX Code Status:FULL Family Communication: Will call and discuss with family. Disposition Plan: TBD ConsultaNTS NONE Procedures:NONE Antimicrobials: Cefepime Subjective: Feels better after the breathing treatment.  Objective: Vitals:   03/04/17 2108 03/05/17 0551 03/05/17 0621 03/05/17 0736  BP: 111/71 131/84    Pulse: 99 (!) 106    Resp: 20 20    Temp: 98.2 F (36.8 C) 99 F (37.2 C)    TempSrc: Oral Oral    SpO2: 94% (!) 74% 94% 92%  Weight:  81.9 kg (180 lb 8.9 oz)    Height:        Intake/Output Summary (Last 24 hours) at 03/05/2017 0955 Last data filed at 03/05/2017 0635 Gross per 24 hour  Intake 870 ml  Output -  Net 870 ml   Filed Weights   02/26/2017 1748 03/04/17 1335 03/05/17 0551  Weight: 78.5 kg (173 lb) 81.4 kg (179 lb 7.3 oz) 81.9 kg (180 lb 8.9 oz)    Examination:  General exam: Appears calm and comfortable  Respiratory system: Clear to auscultation. Respiratory effort normal. Cardiovascular system: S1 & S2 heard, RRR. No JVD, murmurs, rubs, gallops or clicks. No pedal edema.  Gastrointestinal system: Abdomen is nondistended, soft and nontender. No organomegaly or masses felt. Normal bowel sounds heard. Central nervous system: Alert and oriented. No focal neurological deficits. Extremities: Symmetric 5 x  5 power. Skin: No rashes, lesions or ulcers Psychiatry: Judgement and insight appear normal. Mood & affect appropriate.     Data Reviewed: I have personally reviewed following labs and imaging studies  CBC: Recent Labs  Lab 02/28/17 1445 03/01/17 1325 02/24/2017 1833 03/04/17 0043 03/04/17 0500 03/05/17 0620  WBC 11.0* 7.1 11.8* 8.9 9.0 9.7  NEUTROABS 9,999* 6.2 11.3*  --   --   --   HGB 13.3  --  13.5 11.6* 11.7* 11.1*  HCT 37.6* 34.4* 38.7* 34.4* 34.0* 33.5*  MCV 93.3 95.0 94.9 94.8 93.9 96.3  PLT 166 150 129* 98* 84* 72*   Basic Metabolic Panel: Recent Labs  Lab 02/28/17 1445 03/01/17 1325 02/22/2017 1833 03/04/17 0043 03/04/17 0500 03/05/17 0620  NA 131* 132* 133*  --  134* 135  K 4.3 4.0 4.7  --  4.4 3.9  CL 96* 102 101  --  107 107  CO2 26 22 22   --  22 24  GLUCOSE 104* 94 140*  --  152* 89  BUN 20 17 24*  --  21* 21*  CREATININE 0.90  --  1.07 0.94 0.90 0.73  CALCIUM 8.2* 8.1* 9.0  --  8.0* 8.1*   GFR: Estimated Creatinine Clearance: 78.4 mL/min (by C-G formula based on SCr of 0.73 mg/dL). Liver Function Tests: Recent Labs  Lab 02/28/17 1445 03/01/17 1325 02/14/2017 1833  AST 42* 48* 71*  ALT 27 29 42  ALKPHOS  --  88 114  BILITOT 0.6 0.4 <0.1*  PROT 4.7* 4.9* 6.1*  ALBUMIN  --  2.0* 2.6*   No results for input(s): LIPASE, AMYLASE in the last 168 hours. No results for input(s): AMMONIA in the last 168 hours. Coagulation Profile: Recent Labs  Lab 03/01/2017 1833  INR 1.08   Cardiac Enzymes: No results for input(s): CKTOTAL, CKMB, CKMBINDEX, TROPONINI in the last 168 hours. BNP (last 3 results) No results for input(s): PROBNP in the last 8760 hours. HbA1C: No results for input(s): HGBA1C in the last 72 hours. CBG: Recent Labs  Lab 03/04/17 0814 03/04/17 1231 03/04/17 1718 03/04/17 2106 03/05/17 0724  GLUCAP 135* 151* 131* 105* 82   Lipid Profile: No results for input(s): CHOL, HDL, LDLCALC, TRIG, CHOLHDL, LDLDIRECT in the last 72  hours. Thyroid Function Tests: No results for input(s): TSH, T4TOTAL, FREET4, T3FREE, THYROIDAB in the last 72 hours. Anemia Panel: No results for input(s): VITAMINB12, FOLATE, FERRITIN, TIBC, IRON, RETICCTPCT in the last 72 hours. Sepsis Labs: Recent Labs  Lab 02/24/2017 1854 02/21/2017 2048  LATICACIDVEN 3.39* 1.48    Recent Results (from the past 240 hour(s))  Culture, blood (Routine x 2)     Status: None (Preliminary result)   Collection Time: 03/01/2017  6:38 PM  Result Value Ref Range Status   Specimen Description BLOOD RIGHT ANTECUBITAL  Final   Special Requests   Final    BOTTLES DRAWN AEROBIC AND ANAEROBIC Blood Culture adequate volume   Culture   Final    NO GROWTH < 24 HOURS Performed at [facility], [address], [place], [place] [address]    Report Status PENDING  Incomplete  Culture, blood (Routine x 2)     Status: None (Preliminary result)   Collection Time: 03/15/2017  6:38 PM  Result Value Ref Range Status  Specimen Description BLOOD RIGHT FOREARM  Final   Special Requests   Final    BOTTLES DRAWN AEROBIC AND ANAEROBIC Blood Culture adequate volume   Culture   Final    NO GROWTH < 24 HOURS Performed at [facility], [address], [place], [place] [address]    Report Status PENDING  Incomplete  MRSA PCR Screening     Status: None   Collection Time: 03/04/17  6:13 PM  Result Value Ref Range Status   MRSA by PCR NEGATIVE NEGATIVE Final    Comment:        The GeneXpert MRSA Assay (FDA approved for NASAL specimens only), is one component of a comprehensive MRSA colonization surveillance program. It is not intended to diagnose MRSA infection nor to guide or monitor treatment for MRSA infections.          Radiology Studies: Dg Chest 1 View  Result Date: 03/05/2017 CLINICAL DATA:  77 year old male with increased shortness of breath. Hypoxia. EXAM: CHEST 1 VIEW COMPARISON:  Chest x-ray 02/23/2017. FINDINGS: Worsening widespread but  asymmetrically distributed interstitial and airspace disease throughout the lungs bilaterally, most confluent in the right mid lung, compatible with progressive multilobar bronchopneumonia. Probable small right pleural effusion. No left pleural effusion. Heart size appears normal. Upper mediastinal contours are within normal limits. IMPRESSION: 1. Progressively worsening multilobar bronchopneumonia, as above. 2. Probable small right pleural effusion. Electronically Signed   By: Vinnie Langton M.D.   On: 03/05/2017 07:59   Dg Chest 2 View  Result Date: 03/06/2017 CLINICAL DATA:  Shortness of breath. EXAM: CHEST  2 VIEW COMPARISON:  03/07/2017 and prior radiographs FINDINGS: Bilateral interstitial and airspace opacities, right greater than left, has not significantly changed from today's radiograph but have increased since 02/20/2017. No pleural effusion or pneumothorax. The cardiomediastinal silhouette is unchanged. IMPRESSION: Bilateral interstitial and airspace opacities suspicious for pneumonia. These opacities have increased since 02/20/2017. Electronically Signed   By: Margarette Canada M.D.   On: 03/13/2017 19:17   Dg Chest 2 View  Result Date: 03/05/2017 CLINICAL DATA:  Shortness of breath EXAM: CHEST  2 VIEW COMPARISON:  02/20/2017, radiograph 02/02/2011 FINDINGS: Interim development of moderate diffuse right greater than left interstitial and alveolar opacity. No pleural effusion. Stable cardiomediastinal silhouette. No pneumothorax. IMPRESSION: 1. Moderate diffuse right greater than left interstitial and alveolar opacity, could reflect asymmetric edema or pneumonia. Electronically Signed   By: Donavan Foil M.D.   On: 03/10/2017 14:54   Ct Chest W Contrast  Result Date: 03/02/2017 CLINICAL DATA:  77 year old male with history of unresolved pneumonia. Shortness of breath and coughing for the past several weeks. EXAM: CT CHEST WITH CONTRAST TECHNIQUE: Multidetector CT imaging of the chest was  performed during intravenous contrast administration. CONTRAST:  112mL ISOVUE-300 IOPAMIDOL (ISOVUE-300) INJECTION 61% COMPARISON:  No prior chest CT.  Chest x-ray 02/19/2017. FINDINGS: Cardiovascular: Heart size is normal. There is no significant pericardial fluid, thickening or pericardial calcification. There is aortic atherosclerosis, as well as atherosclerosis of the great vessels of the mediastinum and the coronary arteries, including calcified atherosclerotic plaque in the left anterior descending, left circumflex and right coronary arteries. Mediastinum/Nodes: No pathologically enlarged mediastinal or hilar lymph nodes. Small hiatal hernia. No axillary lymphadenopathy. Lungs/Pleura: Relatively diffuse but patchy areas of ground-glass attenuation, septal thickening and thickening of the peribronchovascular interstitium are noted scattered asymmetrically throughout the lungs bilaterally. Several areas demonstrate some immediate subpleural sparing, particularly in the periphery of the right lung. No pleural effusions. Upper Abdomen:  Aortic atherosclerosis. Musculoskeletal: There are no aggressive appearing lytic or blastic lesions noted in the visualized portions of the skeleton. IMPRESSION: 1. The appearance of the lungs is most compatible with severe multilobar pneumonia. 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 2. Aortic atherosclerosis, in addition to 3 vessel coronary artery disease. Assessment for potential risk factor modification, dietary therapy or pharmacologic therapy may be warranted, if clinically indicated. 3. Small hiatal hernia. Aortic Atherosclerosis (ICD10-I70.0). Electronically Signed   By: Vinnie Langton M.D.   On: 03/05/2017 20:30        Scheduled Meds: . acyclovir  400 mg Oral Daily  . enoxaparin (LOVENOX) injection  40 mg Subcutaneous QHS  . ezetimibe  10 mg Oral Daily  . famotidine  10 mg  Oral BID  . guaiFENesin  600 mg Oral BID  . insulin aspart  0-9 Units Subcutaneous TID WC  . ipratropium-albuterol  3 mL Nebulization TID  . latanoprost  1 drop Both Eyes QHS  . levothyroxine  175 mcg Oral QAC breakfast  . lisinopril  5 mg Oral QHS  . loratadine  10 mg Oral Daily  . mouth rinse  15 mL Mouth Rinse BID  . PARoxetine  10 mg Oral Daily  . sodium chloride flush  3 mL Intravenous Q12H   Continuous Infusions: . ceFEPime (MAXIPIME) IV Stopped (03/05/17 0706)  . vancomycin Stopped (03/04/17 1940)     LOS: 2 days      Georgette Shell, MD [facility]  If 7PM-7AM, please contact night-coverage [email or web address] Password [facility] 03/05/2017, 9:55 AM

## 2017-03-05 NOTE — Progress Notes (Signed)
PT is currently 14 lpm Salter- no Sp02 goal provided at this time. Current Sp02 95%, RR 20, PT states he is breathing fine at this time (does not appear to be in distress at this time), BBS clear -diminished.

## 2017-03-05 NOTE — Consult Note (Signed)
[facility] Pulmonary Diseases & Critical Care Medicine Initial Pulmonary/Critical Care Consultation  Patient Name: Gregory Galvan MRN: [id number] DOB: 11-06-1940    ADMISSION DATE:  02/27/2017 CONSULTATION DATE:  03/05/2017  REFERRING MD:  Dr. Landis Gandy  REASON FOR CONSULTATION:  hypoxia   HISTORY OF PRESENT ILLNESS  This 77 y.o. Caucasian male is seen in consultation at the request of Dr. Landis Gandy for recommendations on further evaluation and management of acute hypoxic respiratory failure. The patient was admitted after failed outpatient treatment for "pneumonia". He is currently on cefepime and clindamycin. He does not have a productive cough. He does have increased exertional dyspnea. No fever. No leukocytosis. He certainly has an abnormal CXR and chest CT. He has rapidly increased in supplemental oxygen requirement and is now needing 7 LPM. He has no baseline supplemental oxygen requirement. 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  REVIEW OF SYSTEMS Constitutional: No weight loss. No night sweats. No fever. No chills. No fatigue. HEENT: No headaches, dysphagia, sore throat, otalgia, nasal congestion, PND CV:  No chest pain, orthopnea, PND, swelling in lower extremities, palpitations GI:  No abdominal pain, nausea, vomiting, diarrhea, change in bowel pattern, anorexia Resp: DOE. No sputum production. No rest dyspnea, mucus, hemoptysis, wheezing  GU: no dysuria, change in color of urine, no urgency or frequency.  No flank pain. MS:  No joint pain or swelling. No myalgias,  No decreased range of motion.  Psych:  No change in mood or affect. No memory loss. Skin: rash that was believed to be due to "chemotherapy" subsided with prednisone   PAST MEDICAL/SURGICAL/SOCIAL/FAMILY HISTORIES   Past Medical History:  Diagnosis Date  . BPH (benign prostatic hyperplasia)   . Hyperlipidemia   . Hypertension   . Hypogonadism, male   . Pre-diabetes    . Prostatitis   . Thyroid disease     Past Surgical History:  Procedure Laterality Date  . APPENDECTOMY      Social History   Tobacco Use  . Smoking status: Former Smoker    Last attempt to quit: 12/22/2013    Years since quitting: 3.2  . Smokeless tobacco: Never Used  Substance Use Topics  . Alcohol use: Yes    Alcohol/week: 16.8 oz    Types: 28 Standard drinks or equivalent per week    Comment: drinks wine    Family History  Problem Relation Age of Onset  . Cancer Mother        breast  . Alzheimer's disease Mother   . Hypertension Father   . Heart disease Father   . Lymphoma Father   . Hypertension Brother   . Heart disease Brother   . Heart disease Brother   . Hypertension Brother   . Diabetes Brother      Allergies  Allergen Reactions  . 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 Sulfonamide Derivatives Itching and Swelling  . Penicillins Swelling and Rash    Has patient had a PCN reaction causing immediate rash, facial/tongue/throat swelling, SOB or lightheadedness with hypotension: YES Has patient had a PCN reaction causing severe rash involving mucus membranes or skin necrosis:YES (PENILE SWELLING AND RASH) Has patient had a PCN reaction that required hospitalization:YES Has patient had a PCN reaction occurring within the last 10 years: nO If all of the above answers are "NO", then may proceed with Cephalosporin use.      Prior to Admission medications   Medication Sig Start Date  End Date Taking? Authorizing Provider  acetaminophen (TYLENOL) 325 MG tablet Take 650 mg by mouth every 6 (six) hours as needed for mild pain or moderate pain.   Yes [provider]  acyclovir (ZOVIRAX) 200 MG capsule Take 2 capsules (400 mg total) by mouth daily. This replaces your acyclovir tabs due to allergy concern 02/22/17  Yes Brunetta Genera, MD  ALPRAZolam Duanne Moron) 0.5 MG tablet take 1/2 to 1 tablet by mouth at  bedtime if needed 11/29/16  Yes Unk Pinto, MD  cetirizine (ZYRTEC) 10 MG tablet Take 10 mg by mouth daily.   Yes [provider]  dexamethasone (DECADRON) 4 MG tablet Take 2 tablets (8 mg total) by mouth daily. Start the day after chemotherapy for 2 days. 01/10/17  Yes Brunetta Genera, MD  diphenhydrAMINE (BENADRYL) 25 MG tablet Take 25 mg by mouth at bedtime as needed for sleep.   Yes [provider]  ezetimibe (ZETIA) 10 MG tablet Take 1 tablet daily for Cholesterol 11/19/16 06/19/17 Yes Unk Pinto, MD  famotidine (PEPCID) 10 MG tablet Take 10 mg by mouth 2 (two) times daily.   Yes [provider]  furosemide (LASIX) 40 MG tablet 1-2 pills a day for swelling 08/15/16 08/15/17 Yes Vicie Mutters, PA-C  latanoprost (XALATAN) 0.005 % ophthalmic solution Place 1 drop into both eyes at bedtime.  01/31/13  Yes [provider]  levothyroxine (SYNTHROID, LEVOTHROID) 175 MCG tablet take 1 and 1/2 tablets by mouth once daily or as directed 10/12/16  Yes Unk Pinto, MD  lisinopril (PRINIVIL,ZESTRIL) 5 MG tablet Take 5 mg by mouth at bedtime. 01/19/17  Yes [provider]  Multiple Vitamins-Minerals (MULTIVITAMIN WITH MINERALS) tablet Take 1 tablet by mouth daily.   Yes [provider]  PARoxetine (PAXIL) 20 MG tablet Take 10 mg by mouth daily.   Yes [provider]  Turmeric 500 MG CAPS Take 1,500 mg elemental calcium/kg/hr by mouth daily.   Yes [provider]  doxycycline (VIBRA-TABS) 100 MG tablet Take 1 tablet (100 mg total) by mouth 2 (two) times daily. 02/24/17   Brunetta Genera, MD  ondansetron (ZOFRAN) 8 MG tablet Take 1 tablet (8 mg total) by mouth 2 (two) times daily as needed for refractory nausea / vomiting. Start on day 3 after chemo. 10/12/16   Brunetta Genera, MD  predniSONE (DELTASONE) 20 MG tablet 29m po daily with breakfast for 3 days then 4106mpo daily x 4 days then prednisone 2062mo daily until  reassessment in about 1 week 01/24/17   KalBrunetta GeneraD  prochlorperazine (COMPAZINE) 10 MG tablet Take 1 tablet (10 mg total) by mouth every 6 (six) hours as needed (Nausea or vomiting). Patient not taking: Reported on 03/10/2017 10/12/16   KalBrunetta GeneraD    Current Facility-Administered Medications  Medication Dose Route Frequency Provider Last Rate Last Dose  . acetaminophen (TYLENOL) tablet 650 mg  650 mg Oral Q6H PRN HobElwin MochaD       Or  . acetaminophen (TYLENOL) suppository 650 mg  650 mg Rectal Q6H PRN HobElwin MochaD      . acyclovir (ZOVIRAX) 200 MG capsule 400 mg  400 mg Oral Daily HobElwin MochaD   400 mg at 03/05/17 1056  . albuterol (PROVENTIL) (2.5 MG/3ML) 0.083% nebulizer solution 2.5 mg  2.5 mg Nebulization Q2H PRN HobElwin MochaD      . ALPRAZolam (XADuanne Moronablet 0.5 mg  0.5 mg Oral  QHS PRN Elwin Mocha, MD      . ceFEPIme (MAXIPIME) 1 g in dextrose 5 % 50 mL IVPB  1 g Intravenous Q8H Elwin Mocha, MD 100 mL/hr at 03/05/17 1304 1 g at 03/05/17 1304  . clindamycin (CLEOCIN) IVPB 600 mg  600 mg Intravenous Q6H Carlyle Basques, MD      . ezetimibe (ZETIA) tablet 10 mg  10 mg Oral Daily Elwin Mocha, MD   10 mg at 03/05/17 1056  . famotidine (PEPCID) tablet 10 mg  10 mg Oral BID Elwin Mocha, MD   10 mg at 03/05/17 1054  . guaiFENesin (MUCINEX) 12 hr tablet 600 mg  600 mg Oral BID Elwin Mocha, MD   600 mg at 03/05/17 1053  . insulin aspart (novoLOG) injection 0-9 Units  0-9 Units Subcutaneous TID WC Elwin Mocha, MD   1 Units at 03/04/17 1735  . ipratropium-albuterol (DUONEB) 0.5-2.5 (3) MG/3ML nebulizer solution 3 mL  3 mL Nebulization TID Georgette Shell, MD   3 mL at 03/05/17 1356  . latanoprost (XALATAN) 0.005 % ophthalmic solution 1 drop  1 drop Both Eyes QHS Elwin Mocha, MD   1 drop at 03/04/17 2125  . levothyroxine (SYNTHROID, LEVOTHROID) tablet 175 mcg  175 mcg Oral QAC breakfast Elwin Mocha, MD    175 mcg at 03/05/17 8295  . lisinopril (PRINIVIL,ZESTRIL) tablet 5 mg  5 mg Oral QHS Elwin Mocha, MD   5 mg at 03/04/17 2125  . loratadine (CLARITIN) tablet 10 mg  10 mg Oral Daily Elwin Mocha, MD   10 mg at 03/05/17 1055  . MEDLINE mouth rinse  15 mL Mouth Rinse BID Unk Pinto, MD   15 mL at 03/05/17 1107  . methylPREDNISolone sodium succinate (SOLU-MEDROL) 125 mg/2 mL injection 60 mg  60 mg Intravenous Q6H Georgette Shell, MD   60 mg at 03/05/17 1100  . ondansetron (ZOFRAN) tablet 4 mg  4 mg Oral Q6H PRN Elwin Mocha, MD       Or  . ondansetron [facility]) injection 4 mg  4 mg Intravenous Q6H PRN Elwin Mocha, MD      . PARoxetine (PAXIL) tablet 10 mg  10 mg Oral Daily Elwin Mocha, MD   10 mg at 03/05/17 1055  . primaquine tablet 30 mg  30 mg Oral Daily Carlyle Basques, MD      . sodium chloride flush (NS) 0.9 % injection 3 mL  3 mL Intravenous Q12H Elwin Mocha, MD   3 mL at 03/04/17 2126     VITAL SIGNS: BP 131/84 (BP Location: Left Arm)   Pulse (!) 106   Temp 99 F (37.2 C) (Oral)   Resp 20   Ht _0  (1.727 m)   Wt 81.9 kg (180 lb 8.9 oz)   SpO2 97%   BMI 27.45 kg/m   HEMODYNAMICS:    VENTILATOR SETTINGS:    INTAKE / OUTPUT: I/O last 3 completed shifts: In: 2420 [P.O.:270; IV Piggyback:2150] Out: 600 [Urine:600]  PHYSICAL EXAMINATION: General:  Pleasant. Well-developed. Alert, awake and oriented to time, person and place. Cooperative. No acute distress. Normal affect. Head: normocephalic, atraumatic EYE: PERRLA, EOM intact, no scleral icterus, no pallor Nose: nares are patent. No polyps. No exudate. No sinus tenderness. Throat/Oral Cavity: Normal dentition. No oral thrush. No exudate. Mucous membranes are moist. No tonsillar enlargement. Neck: supple, no thyromegaly, no JVD, no lymphadenopathy. Trachea midline. Chest/Lung: symmetric in  development and expansion. Good air entry. Diffuse bilateral crackles. R basilar rhonchi that  clear with cough. Heart: Regular S1 and S2 without murmur, rub or gallop. Abdomen: soft, nontender, nondistended. Normoactive bowel sounds. n rebound. No guarding. Extremities: no pedal edema, no cyanosis, no clubbing. 2+ DP pulses Lymphatic: no cervical/axiallary/inguinal lymph nodes appreciated Skin:  No rash or lesion. NEURO: cranial nerves II-XII are grossly symmetric and physiologic. Babinski absent. No sensory deficit. No motor deficit. DTR 2+ @ RUE, 2+ @ LUE 2+ @ RLL,  2+ @ LLL. No cerebellar signs. Gait was not assessed.   LABS:  BMET Recent Labs  Lab 02/24/2017 1833 03/04/17 0043 03/04/17 0500 03/05/17 0620  NA 133*  --  134* 135  K 4.7  --  4.4 3.9  CL 101  --  107 107  CO2 22  --  22 24  BUN 24*  --  21* 21*  CREATININE 1.07 0.94 0.90 0.73  GLUCOSE 140*  --  152* 89    Electrolytes Recent Labs  Lab 03/04/2017 1833 03/04/17 0500 03/05/17 0620  CALCIUM 9.0 8.0* 8.1*    CBC Recent Labs  Lab 03/04/17 0043 03/04/17 0500 03/05/17 0620  WBC 8.9 9.0 9.7  HGB 11.6* 11.7* 11.1*  HCT 34.4* 34.0* 33.5*  PLT 98* 84* 72*    Coag's Recent Labs  Lab 03/10/2017 1833  INR 1.08    Sepsis Markers Recent Labs  Lab 03/02/2017 1854 02/25/2017 2048  LATICACIDVEN 3.39* 1.48    ABG Recent Labs  Lab 03/05/17 0840  PHART 7.465*  PCO2ART 31.2*  PO2ART 68.4*    Liver Enzymes Recent Labs  Lab 02/28/17 1445 03/01/17 1325 02/26/2017 1833  AST 42* 48* 71*  ALT 27 29 42  ALKPHOS  --  88 114  BILITOT 0.6 0.4 <0.1*  ALBUMIN  --  2.0* 2.6*    Cardiac Enzymes No results for input(s): TROPONINI, PROBNP in the last 168 hours.  Glucose Recent Labs  Lab 03/04/17 0814 03/04/17 1231 03/04/17 1718 03/04/17 2106 03/05/17 0724 03/05/17 1213  GLUCAP 135* 151* 131* 105* 82 100*    Imaging Dg Chest 1 View  Result Date: 03/05/2017 CLINICAL DATA:  77 year old male with increased shortness of breath. Hypoxia. EXAM: CHEST 1 VIEW COMPARISON:  Chest x-ray 03/07/2017.  FINDINGS: Worsening widespread but asymmetrically distributed interstitial and airspace disease throughout the lungs bilaterally, most confluent in the right mid lung, compatible with progressive multilobar bronchopneumonia. Probable small right pleural effusion. No left pleural effusion. Heart size appears normal. Upper mediastinal contours are within normal limits. IMPRESSION: 1. Progressively worsening multilobar bronchopneumonia, as above. 2. Probable small right pleural effusion. Electronically Signed   By: Vinnie Langton M.D.   On: 03/05/2017 07:59     STUDIES:  -  CULTURES: Results for orders placed or performed during the hospital encounter of 03/12/2017  Culture, blood (Routine x 2)     Status: None (Preliminary result)   Collection Time: 03/02/2017  6:38 PM  Result Value Ref Range Status   Specimen Description BLOOD RIGHT ANTECUBITAL  Final   Special Requests   Final    BOTTLES DRAWN AEROBIC AND ANAEROBIC Blood Culture adequate volume   Culture   Final    NO GROWTH 2 DAYS Performed at [facility], [place] [address], [place], [place] [address]    Report Status PENDING  Incomplete  Culture, blood (Routine x 2)     Status: None (Preliminary result)   Collection Time: 03/16/2017  6:38 PM  Result  Value Ref Range Status   Specimen Description BLOOD RIGHT FOREARM  Final   Special Requests   Final    BOTTLES DRAWN AEROBIC AND ANAEROBIC Blood Culture adequate volume   Culture   Final    NO GROWTH 2 DAYS Performed at [facility], [address] [place], [place] [address]    Report Status PENDING  Incomplete  MRSA PCR Screening     Status: None   Collection Time: 03/04/17  6:13 PM  Result Value Ref Range Status   MRSA by PCR NEGATIVE NEGATIVE Final    Comment:        The GeneXpert MRSA Assay (FDA approved for NASAL specimens only), is one component of a comprehensive MRSA colonization surveillance program. It is not intended to diagnose MRSA infection nor to guide  or monitor treatment for MRSA infections.     ANTIBIOTICS: Cefepime clindamycin  SIGNIFICANT EVENTS: 1/20 >> transferred to stepdown unit for increasing O2 requirement  LINES/TUBES: -   ASSESSMENT / PLAN: Active Problems:   Sepsis due to pneumonia [facility])  It is not clear whether or not this patient has an acute infection at this time. I also agree with Dr. Baxter Flattery that it is unclear that the patient has been on doses of prednisone to really increase his risk of PJP or other opportunistic infections. Of course, he has a history of Velcade therapy. Alternatively, pulmonary involvement of amyloidosis should also be considered. However, with a rising oxygen requirement, lung biopsy at this time may come with excessive risk of ending up on mechanical ventilatory support. Furthermore, it would not change my position that high-dose steroid therapy is warranted at this time. Bronchoscopy with BAL may carry a more acceptable risk, but again, I question whether he is really at significantly increased risk of opportunistic infection to justify proceeding with even BAL. I will order hypertonic saline nebs to facilitate induce sputum samples. Perhaps the lab will be able to process them for culture and cytology. Check LDH. I agree with high-dose steroids. I will defer to Dr. Irene Limbo re: specific dosing.   FAMILY  - Updates: wife and son at bedside  - Inter-disciplinary family meet or Palliative Care meeting due by:  day 7   Renee Pain, MD Board Certified by the ABIM, [place] Pager: [phone]  03/05/2017, 2:41 PM CC: 75 min

## 2017-03-05 NOTE — Consult Note (Addendum)
[facility]  Total days of antibiotics 3        Day 3 cefepime        Day 3 vancomycin               Reason for Consult: pneumonia    Referring Physician: Hassell Done  Active Problems:   Sepsis due to pneumonia [facility])    HPI: Gregory Galvan is a 77 y.o. 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 He was noted to have fevers of 100.58F on 1/7 but states that he had congestion for the past few weeks. He was seen in the oncology clinic for his URTI and given a 7 day course of doxycycline. His cxr showed some evidence of  diffuse mild bilateral interstitial prominence c/ w mild pneumonitis. He was mildly improved on 1/9 visit with plans to extend to a 10 day course (and continued on velcade and dexamethasone cycle, which he received last wednesday). He called the oncology on 1/18 saying that he has worsening shortness of breath (the day after he finished doxy course) thus he was sent to the ED for evaluation. In the ED, he was 87-90% on 4LPM. His labs only revealed WBC of 9. His cxr showed moderate interstitial and alveolar infiltrates with R > L.  He was started on vancomycin plus cefepime on HCAP empirically but over the last 48hr still having increasing hypoxia, not much of productive cough. He required [facility], and transferred to ICU for further management. Primary team concern for PCP given recent history of steroids/immunocompromised host.   Past Medical History:  Diagnosis Date  . BPH (benign prostatic hyperplasia)   . Hyperlipidemia   . Hypertension   . Hypogonadism, male   . Pre-diabetes   . Prostatitis   . Thyroid disease     Allergies:  Allergies  Allergen Reactions  . 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 Sulfonamide Derivatives  Itching and Swelling  . Penicillins Swelling and Rash    Has patient had a PCN reaction causing immediate rash, facial/tongue/throat swelling, SOB or lightheadedness with hypotension: YES Has patient had a PCN reaction causing severe rash involving mucus membranes or skin necrosis:YES (PENILE SWELLING AND RASH) Has patient had a PCN reaction that required hospitalization:YES Has patient had a PCN reaction occurring within the last 10 years: nO If all of the above answers are "NO", then may proceed with Cephalosporin use.     MEDICATIONS: . acyclovir  400 mg Oral Daily  . ezetimibe  10 mg Oral Daily  . famotidine  10 mg Oral BID  . guaiFENesin  600 mg Oral BID  . insulin aspart  0-9 Units Subcutaneous TID WC  . ipratropium-albuterol  3 mL Nebulization TID  . latanoprost  1 drop Both Eyes QHS  . levothyroxine  175 mcg Oral QAC breakfast  . lisinopril  5 mg Oral QHS  . loratadine  10 mg Oral Daily  . mouth rinse  15 mL Mouth Rinse BID  . methylPREDNISolone (SOLU-MEDROL) injection  60 mg Intravenous Q6H  . PARoxetine  10 mg Oral Daily  . primaquine  30 mg Oral Daily  . sodium chloride flush  3 mL Intravenous Q12H    Social History   Tobacco Use  . Smoking  status: Former Smoker    Last attempt to quit: 12/22/2013    Years since quitting: 3.2  . Smokeless tobacco: Never Used  Substance Use Topics  . Alcohol use: Yes    Alcohol/week: 16.8 oz    Types: 28 Standard drinks or equivalent per week    Comment: drinks wine  . Drug use: No    Family History  Problem Relation Age of Onset  . Cancer Mother        breast  . Alzheimer's disease Mother   . Hypertension Father   . Heart disease Father   . Lymphoma Father   . Hypertension Brother   . Heart disease Brother   . Heart disease Brother   . Hypertension Brother   . Diabetes Brother     Review of Systems  Constitutional: Negative for fever, chills, diaphoresis, activity change, appetite change, fatigue and unexpected  weight change.  HENT: Negative for congestion, sore throat, rhinorrhea, sneezing, trouble swallowing and sinus pressure.  Eyes: Negative for photophobia and visual disturbance.  Respiratory: no cough but SOb, DOE. Negative for cough, chest tightness, wheezing and stridor.  Cardiovascular: Negative for chest pain, palpitations and leg swelling.  Gastrointestinal: + constipation. Negative for nausea, vomiting, abdominal pain, diarrhea,, blood in stool, abdominal distention and anal bleeding.  Genitourinary: Negative for dysuria, hematuria, flank pain and difficulty urinating.  Musculoskeletal: Negative for myalgias, back pain, joint swelling, arthralgias and gait problem.  Skin: Negative for color change, pallor, rash and wound.  Neurological: Negative for dizziness, tremors, weakness and light-headedness.  Hematological: Negative for adenopathy. Does not bruise/bleed easily.  Psychiatric/Behavioral: Negative for behavioral problems, confusion, sleep disturbance, dysphoric mood, decreased concentration and agitation.     OBJECTIVE: Temp:  [98.2 F (36.8 C)-99 F (37.2 C)] 99 F (37.2 C) (01/20 0551) Pulse Rate:  [99-106] 106 (01/20 0551) Resp:  [20] 20 (01/20 0551) BP: (111-131)/(71-84) 131/84 (01/20 0551) SpO2:  [74 %-97 %] 97 % (01/20 1356) Weight:  [180 lb 8.9 oz (81.9 kg)] 180 lb 8.9 oz (81.9 kg) (01/20 0551) Physical Exam  Constitutional: He is oriented to person, place, and time. He appears well-developed and well-nourished. No distress.  HENT: mild sore on right side of lower lip. Poor dentition Mouth/Throat: Oropharynx is clear and moist. No oropharyngeal exudate.  Cardiovascular: Normal rate, regular rhythm and normal heart sounds. Exam reveals no gallop and no friction rub.  No murmur heard.  Pulmonary/Chest: Effort normal and breath sounds normal. No respiratory distress. He has no wheezes.  Abdominal: distended.  Bowel sounds are decreased. He exhibits no distension. There  is no tenderness.  Lymphadenopathy: He has no cervical adenopathy.  Neurological: He is alert and oriented to person, place, and time.  Skin: Skin is warm and dry. No rash noted. No erythema.  Psychiatric: He has a normal mood and affect. His behavior is normal.     LABS: Results for orders placed or performed during the hospital encounter of 03/09/2017 (from the past 48 hour(s))  Comprehensive metabolic panel     Status: Abnormal   Collection Time: 02/28/2017  6:33 PM  Result Value Ref Range   Sodium 133 (L) 135 - 145 mmol/L   Potassium 4.7 3.5 - 5.1 mmol/L   Chloride 101 101 - 111 mmol/L   CO2 22 22 - 32 mmol/L   Glucose, Bld 140 (H) 65 - 99 mg/dL   BUN 24 (H) 6 - 20 mg/dL   Creatinine, Ser 1.07 0.61 - 1.24 mg/dL   Calcium 9.0  8.9 - 10.3 mg/dL   Total Protein 6.1 (L) 6.5 - 8.1 g/dL   Albumin 2.6 (L) 3.5 - 5.0 g/dL   AST 71 (H) 15 - 41 U/L   ALT 42 17 - 63 U/L   Alkaline Phosphatase 114 38 - 126 U/L   Total Bilirubin <0.1 (L) 0.3 - 1.2 mg/dL   GFR calc non Af Amer >60 >60 mL/min   GFR calc Af Amer >60 >60 mL/min    Comment: (NOTE) The eGFR has been calculated using the CKD EPI equation. This calculation has not been validated in all clinical situations. eGFR's persistently <60 mL/min signify possible Chronic Kidney Disease.    Anion gap 10 5 - 15  CBC with Differential     Status: Abnormal   Collection Time: 03/16/2017  6:33 PM  Result Value Ref Range   WBC 11.8 (H) 4.0 - 10.5 K/uL   RBC 4.08 (L) 4.22 - 5.81 MIL/uL   Hemoglobin 13.5 13.0 - 17.0 g/dL   HCT 38.7 (L) 39.0 - 52.0 %   MCV 94.9 78.0 - 100.0 fL   MCH 33.1 26.0 - 34.0 pg   MCHC 34.9 30.0 - 36.0 g/dL   RDW 14.1 11.5 - 15.5 %   Platelets 129 (L) 150 - 400 K/uL   Neutrophils Relative % 95 %   Neutro Abs 11.3 (H) 1.7 - 7.7 K/uL   Lymphocytes Relative 2 %   Lymphs Abs 0.2 (L) 0.7 - 4.0 K/uL   Monocytes Relative 3 %   Monocytes Absolute 0.3 0.1 - 1.0 K/uL   Eosinophils Relative 0 %   Eosinophils Absolute 0.0 0.0 -  0.7 K/uL   Basophils Relative 0 %   Basophils Absolute 0.0 0.0 - 0.1 K/uL  Protime-INR     Status: None   Collection Time: 02/21/2017  6:33 PM  Result Value Ref Range   Prothrombin Time 14.0 11.4 - 15.2 seconds   INR 1.08   Culture, blood (Routine x 2)     Status: None (Preliminary result)   Collection Time: 03/07/2017  6:38 PM  Result Value Ref Range   Specimen Description BLOOD RIGHT ANTECUBITAL    Special Requests      BOTTLES DRAWN AEROBIC AND ANAEROBIC Blood Culture adequate volume   Culture      NO GROWTH 2 DAYS Performed at [facility], [place] [address], [place], [place] [address]    Report Status PENDING   Culture, blood (Routine x 2)     Status: None (Preliminary result)   Collection Time: 02/15/2017  6:38 PM  Result Value Ref Range   Specimen Description BLOOD RIGHT FOREARM    Special Requests      BOTTLES DRAWN AEROBIC AND ANAEROBIC Blood Culture adequate volume   Culture      NO GROWTH 2 DAYS Performed at [facility], [place] [address], [place], [place] [address]    Report Status PENDING   I-Stat CG4 Lactic Acid, ED     Status: Abnormal   Collection Time: 02/15/2017  6:54 PM  Result Value Ref Range   Lactic Acid, Venous 3.39 (HH) 0.5 - 1.9 mmol/L   Comment NOTIFIED PHYSICIAN   I-Stat CG4 Lactic Acid, ED     Status: None   Collection Time: 02/26/2017  8:48 PM  Result Value Ref Range   Lactic Acid, Venous 1.48 0.5 - 1.9 mmol/L  CBC     Status: Abnormal   Collection Time: 03/04/17 12:43 AM  Result Value Ref  Range   WBC 8.9 4.0 - 10.5 K/uL   RBC 3.63 (L) 4.22 - 5.81 MIL/uL   Hemoglobin 11.6 (L) 13.0 - 17.0 g/dL   HCT 34.4 (L) 39.0 - 52.0 %   MCV 94.8 78.0 - 100.0 fL   MCH 32.0 26.0 - 34.0 pg   MCHC 33.7 30.0 - 36.0 g/dL   RDW 14.1 11.5 - 15.5 %   Platelets 98 (L) 150 - 400 K/uL    Comment: REPEATED TO VERIFY SPECIMEN CHECKED FOR CLOTS PLATELET COUNT CONFIRMED BY SMEAR   Creatinine, serum     Status: None   Collection Time: 03/04/17 12:43 AM  Result  Value Ref Range   Creatinine, Ser 0.94 0.61 - 1.24 mg/dL   GFR calc non Af Amer >60 >60 mL/min   GFR calc Af Amer >60 >60 mL/min    Comment: (NOTE) The eGFR has been calculated using the CKD EPI equation. This calculation has not been validated in all clinical situations. eGFR's persistently <60 mL/min signify possible Chronic Kidney Disease.   Urinalysis, Routine w reflex microscopic     Status: Abnormal   Collection Time: 03/04/17  1:24 AM  Result Value Ref Range   Color, Urine YELLOW YELLOW   APPearance CLEAR CLEAR   Specific Gravity, Urine 1.017 1.005 - 1.030   pH 6.0 5.0 - 8.0   Glucose, UA NEGATIVE NEGATIVE mg/dL   Hgb urine dipstick NEGATIVE NEGATIVE   Bilirubin Urine NEGATIVE NEGATIVE   Ketones, ur NEGATIVE NEGATIVE mg/dL   Protein, ur 100 (A) NEGATIVE mg/dL   Nitrite NEGATIVE NEGATIVE   Leukocytes, UA NEGATIVE NEGATIVE   RBC / HPF 0-5 0 - 5 RBC/hpf   WBC, UA 0-5 0 - 5 WBC/hpf   Bacteria, UA NONE SEEN NONE SEEN   Squamous Epithelial / LPF NONE SEEN NONE SEEN   Mucus PRESENT    Hyaline Casts, UA PRESENT   Basic metabolic panel     Status: Abnormal   Collection Time: 03/04/17  5:00 AM  Result Value Ref Range   Sodium 134 (L) 135 - 145 mmol/L   Potassium 4.4 3.5 - 5.1 mmol/L   Chloride 107 101 - 111 mmol/L   CO2 22 22 - 32 mmol/L   Glucose, Bld 152 (H) 65 - 99 mg/dL   BUN 21 (H) 6 - 20 mg/dL   Creatinine, Ser 0.90 0.61 - 1.24 mg/dL   Calcium 8.0 (L) 8.9 - 10.3 mg/dL   GFR calc non Af Amer >60 >60 mL/min   GFR calc Af Amer >60 >60 mL/min    Comment: (NOTE) The eGFR has been calculated using the CKD EPI equation. This calculation has not been validated in all clinical situations. eGFR's persistently <60 mL/min signify possible Chronic Kidney Disease.    Anion gap 5 5 - 15  CBC     Status: Abnormal   Collection Time: 03/04/17  5:00 AM  Result Value Ref Range   WBC 9.0 4.0 - 10.5 K/uL   RBC 3.62 (L) 4.22 - 5.81 MIL/uL   Hemoglobin 11.7 (L) 13.0 - 17.0 g/dL    HCT 34.0 (L) 39.0 - 52.0 %   MCV 93.9 78.0 - 100.0 fL   MCH 32.3 26.0 - 34.0 pg   MCHC 34.4 30.0 - 36.0 g/dL   RDW 14.2 11.5 - 15.5 %   Platelets 84 (L) 150 - 400 K/uL    Comment: CONSISTENT WITH PREVIOUS RESULT  CBG monitoring, ED     Status: Abnormal   Collection  Time: 03/04/17  8:14 AM  Result Value Ref Range   Glucose-Capillary 135 (H) 65 - 99 mg/dL  CBG monitoring, ED     Status: Abnormal   Collection Time: 03/04/17 12:31 PM  Result Value Ref Range   Glucose-Capillary 151 (H) 65 - 99 mg/dL  Glucose, capillary     Status: Abnormal   Collection Time: 03/04/17  5:18 PM  Result Value Ref Range   Glucose-Capillary 131 (H) 65 - 99 mg/dL   Comment 1 Notify RN   MRSA PCR Screening     Status: None   Collection Time: 03/04/17  6:13 PM  Result Value Ref Range   MRSA by PCR NEGATIVE NEGATIVE    Comment:        The GeneXpert MRSA Assay (FDA approved for NASAL specimens only), is one component of a comprehensive MRSA colonization surveillance program. It is not intended to diagnose MRSA infection nor to guide or monitor treatment for MRSA infections.   Glucose, capillary     Status: Abnormal   Collection Time: 03/04/17  9:06 PM  Result Value Ref Range   Glucose-Capillary 105 (H) 65 - 99 mg/dL  CBC     Status: Abnormal   Collection Time: 03/05/17  6:20 AM  Result Value Ref Range   WBC 9.7 4.0 - 10.5 K/uL   RBC 3.48 (L) 4.22 - 5.81 MIL/uL   Hemoglobin 11.1 (L) 13.0 - 17.0 g/dL   HCT 33.5 (L) 39.0 - 52.0 %   MCV 96.3 78.0 - 100.0 fL   MCH 31.9 26.0 - 34.0 pg   MCHC 33.1 30.0 - 36.0 g/dL   RDW 14.4 11.5 - 15.5 %   Platelets 72 (L) 150 - 400 K/uL    Comment: RESULT REPEATED AND VERIFIED SPECIMEN CHECKED FOR CLOTS CONSISTENT WITH PREVIOUS RESULT   Basic metabolic panel     Status: Abnormal   Collection Time: 03/05/17  6:20 AM  Result Value Ref Range   Sodium 135 135 - 145 mmol/L   Potassium 3.9 3.5 - 5.1 mmol/L   Chloride 107 101 - 111 mmol/L   CO2 24 22 - 32 mmol/L    Glucose, Bld 89 65 - 99 mg/dL   BUN 21 (H) 6 - 20 mg/dL   Creatinine, Ser 0.73 0.61 - 1.24 mg/dL   Calcium 8.1 (L) 8.9 - 10.3 mg/dL   GFR calc non Af Amer >60 >60 mL/min   GFR calc Af Amer >60 >60 mL/min    Comment: (NOTE) The eGFR has been calculated using the CKD EPI equation. This calculation has not been validated in all clinical situations. eGFR's persistently <60 mL/min signify possible Chronic Kidney Disease.    Anion gap 4 (L) 5 - 15  Glucose, capillary     Status: None   Collection Time: 03/05/17  7:24 AM  Result Value Ref Range   Glucose-Capillary 82 65 - 99 mg/dL  Blood gas, arterial     Status: Abnormal   Collection Time: 03/05/17  8:40 AM  Result Value Ref Range   FIO2 1.00    Delivery systems NON-REBREATHER OXYGEN MASK    pH, Arterial 7.465 (H) 7.350 - 7.450   pCO2 arterial 31.2 (L) 32.0 - 48.0 mmHg   pO2, Arterial 68.4 (L) 83.0 - 108.0 mmHg   Bicarbonate 22.1 20.0 - 28.0 mmol/L   Acid-base deficit 0.5 0.0 - 2.0 mmol/L   O2 Saturation 92.9 %   Patient temperature 98.6    Collection site LEFT RADIAL  Drawn by [id number]    Sample type ARTERIAL DRAW    Allens test (pass/fail) PASS PASS  Glucose, capillary     Status: Abnormal   Collection Time: 03/05/17 12:13 PM  Result Value Ref Range   Glucose-Capillary 100 (H) 65 - 99 mg/dL    MICRO: 1/18 blood cx ngtd IMAGING: Dg Chest 1 View  Result Date: 03/05/2017 CLINICAL DATA:  77 year old male with increased shortness of breath. Hypoxia. EXAM: CHEST 1 VIEW COMPARISON:  Chest x-ray 03/08/2017. FINDINGS: Worsening widespread but asymmetrically distributed interstitial and airspace disease throughout the lungs bilaterally, most confluent in the right mid lung, compatible with progressive multilobar bronchopneumonia. Probable small right pleural effusion. No left pleural effusion. Heart size appears normal. Upper mediastinal contours are within normal limits. IMPRESSION: 1. Progressively worsening multilobar  bronchopneumonia, as above. 2. Probable small right pleural effusion. Electronically Signed   By: Vinnie Langton M.D.   On: 03/05/2017 07:59   Dg Chest 2 View  Result Date: 03/16/2017 CLINICAL DATA:  Shortness of breath. EXAM: CHEST  2 VIEW COMPARISON:  03/05/2017 and prior radiographs FINDINGS: Bilateral interstitial and airspace opacities, right greater than left, has not significantly changed from today's radiograph but have increased since 02/20/2017. No pleural effusion or pneumothorax. The cardiomediastinal silhouette is unchanged. IMPRESSION: Bilateral interstitial and airspace opacities suspicious for pneumonia. These opacities have increased since 02/20/2017. Electronically Signed   By: Margarette Canada M.D.   On: 03/13/2017 19:17   Dg Chest 2 View  Result Date: 02/21/2017 CLINICAL DATA:  Shortness of breath EXAM: CHEST  2 VIEW COMPARISON:  02/20/2017, radiograph 02/02/2011 FINDINGS: Interim development of moderate diffuse right greater than left interstitial and alveolar opacity. No pleural effusion. Stable cardiomediastinal silhouette. No pneumothorax. IMPRESSION: 1. Moderate diffuse right greater than left interstitial and alveolar opacity, could reflect asymmetric edema or pneumonia. Electronically Signed   By: Donavan Foil M.D.   On: 03/13/2017 14:54   Ct Chest W Contrast  Result Date: 02/20/2017 CLINICAL DATA:  77 year old male with history of unresolved pneumonia. Shortness of breath and coughing for the past several weeks. EXAM: CT CHEST WITH CONTRAST TECHNIQUE: Multidetector CT imaging of the chest was performed during intravenous contrast administration. CONTRAST:  169m ISOVUE-300 IOPAMIDOL (ISOVUE-300) INJECTION 61% COMPARISON:  No prior chest CT.  Chest x-ray 02/26/2017. FINDINGS: Cardiovascular: Heart size is normal. There is no significant pericardial fluid, thickening or pericardial calcification. There is aortic atherosclerosis, as well as atherosclerosis of the great vessels of  the mediastinum and the coronary arteries, including calcified atherosclerotic plaque in the left anterior descending, left circumflex and right coronary arteries. Mediastinum/Nodes: No pathologically enlarged mediastinal or hilar lymph nodes. Small hiatal hernia. No axillary lymphadenopathy. Lungs/Pleura: Relatively diffuse but patchy areas of ground-glass attenuation, septal thickening and thickening of the peribronchovascular interstitium are noted scattered asymmetrically throughout the lungs bilaterally. Several areas demonstrate some immediate subpleural sparing, particularly in the periphery of the right lung. No pleural effusions. Upper Abdomen: Aortic atherosclerosis. Musculoskeletal: There are no aggressive appearing lytic or blastic lesions noted in the visualized portions of the skeleton. IMPRESSION: 1. The appearance of the lungs is most compatible with severe multilobar pneumonia. 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 2. Aortic atherosclerosis, in addition to 3 vessel coronary artery disease. Assessment for potential risk factor modification, dietary therapy or pharmacologic therapy may be warranted, if clinically indicated. 3. Small hiatal hernia. Aortic Atherosclerosis (ICD10-I70.0). Electronically Signed   By: DMauri BrooklynD.  On: 03/10/2017 20:30    Assessment/Plan: 77yo M with renal AL amyloidosis on velcade and dexamethasone but started to be treated for a respiratory infection on 1/7 that has progressed steadily over the last 2-3 weeks. His cxr do have the appears more so of interstitial process where I have a high suspicion this may be more lung involvement of amyloidosis that has been occurring for the past several weeks but now more significant hypoxia.   He had a short course of high dose steroid back in December for drug associated rash but it does not necessarily in the last 4 weeks per  epic notes/ clinical history.  Agree with plan to give high dose steroids to see if there is clinical improvement. Defer to pulmonology to whether dosing should be higher.  Please do sputum culture and pjp pcr testing, if by chance this is a concominant atypical infection such as pjp or nocardia.  He appears stable for the moment and would continue cefepime and vancomycin for now. If he clinically decompensates I would add bactrim to cover PCP- he reports a rash to bactrim (but in was in 1970s) we could challenge if needed   If he clinical decompensates,and requires intubation, then can send a BAL for the specimen.  Agree with getting RVP panel. Flu has been rule out.   Will check in tomorrow to evaluate his progress.  Remote abtx allergy to sulfa = maybe worth challenging if he needs bactrim.   Constipation = recommend oral bowel regimen plus bisacodyl suppository. miralax daily

## 2017-03-05 NOTE — Progress Notes (Signed)
[name]    HEMATOLOGY/ONCOLOGY INPATIENT NOTE  Date of Service: 03/05/17   Patient Care Team: Unk Pinto, MD as PCP - General (Internal Medicine) Inda Castle, MD (Inactive) as Consulting Physician (Gastroenterology) Rexene Agent, MD as Attending Physician (Nephrology) Pearson Grippe MD -nephrology   CHIEF COMPLAINTS/PURPOSE OF CONSULTATION:  Renal AL Amyloidosis  HISTORY OF PRESENTING ILLNESS:  Gregory Galvan is a wonderful 77 y.o. male who has been referred to [place] by Dr .Unk Pinto, MD /Dr Pearson Grippe MD for evaluation and management of newly diagnosed Renal AL Amyloidosis.  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   He recently developed lower extremity swelling and increasing proteinuria and was referred to nephrology and was seen by Dr. Pearson Grippe at [facility].   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  SPEP showed a small M spike of 0.2 g/dL IFE showing IgG lambda monoclonal paraproteinemia. Increase in serum lambda light chain with a decreased kappa/Lambda light chain ratio.  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 [place] red stain positive.  Patient was referred to [place] for further evaluation and management of his newly diagnosed AL Amyloidosis .  Patient notes his leg swelling is better with diuretics.  Notes no fever no chills no night sweats no unexpected weight loss. No focal bone pains. Labs have not shown any overt anemia or hypercalcemia. His creatinine is relatively normal at 1. Hypoalbuminemia with albumin level of 2.7 g/dL.  INTERVAL HISTORY   Gregory Galvan  Was admitted for increasing shortness of breath with multilobar infiltrates on  rpt CXR. Had been recently treated with Doxycycline for bronchitis /Atypical pneumonia but has worsening symptoms. CXR today with worsening on broad spectrum abx for HCAP. Still needing significant oxygenation and was started on solumedrol today. Concern for atypical infection including PJP in the setting of immunosuppression and mild hypogammaglobulinemia.   MEDICAL HISTORY:  Past Medical History:  Diagnosis Date  . BPH (benign prostatic hyperplasia)   . Hyperlipidemia   . Hypertension   . Hypogonadism, male   . Pre-diabetes   . Prostatitis   . 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 No surgical procedure performed. History of hypertension for 30 years on ACE inhibitor, beta blocker and Lasix Hypothyroidism   SURGICAL HISTORY: Past Surgical History:  Procedure Laterality Date  . APPENDECTOMY      SOCIAL HISTORY: Social History   Socioeconomic History  . Marital status: Divorced    Spouse name: Not on file  . Number of children: Not on file  . Years of education: Not on file  . Highest education level: Not on file  Social Needs  . Financial resource strain: Not on file  . Food insecurity - worry: Not on file  . Food insecurity - inability: Not on file  . Transportation needs - medical: Not on file  . Transportation needs - non-medical: Not on file  Occupational History  . Not on file  Tobacco Use  . Smoking status: Former Smoker    Last attempt to quit: 12/22/2013    Years since quitting: 3.2  . Smokeless tobacco: Never Used  Substance and Sexual Activity  . Alcohol use: Yes  Alcohol/week: 16.8 oz    Types: 28 Standard drinks or equivalent per week    Comment: drinks wine  . Drug use: No  . Sexual activity: Not on file  Other Topics Concern  . Not on file  Social History Narrative  . Not on file    FAMILY HISTORY: Family History  Problem Relation Age of Onset  . Cancer Mother        breast  .  Alzheimer's disease Mother   . Hypertension Father   . Heart disease Father   . Lymphoma Father   . Hypertension Brother   . Heart disease Brother   . Heart disease Brother   . Hypertension Brother   . Diabetes Brother     ALLERGIES:  is allergic to ppd [tuberculin purified protein derivative]; sulfonamide derivatives; and penicillins.  MEDICATIONS:  Current Facility-Administered Medications  Medication Dose Route Frequency Provider Last Rate Last Dose  . acetaminophen (TYLENOL) tablet 650 mg  650 mg Oral Q6H PRN Elwin Mocha, MD       Or  . acetaminophen (TYLENOL) suppository 650 mg  650 mg Rectal Q6H PRN Elwin Mocha, MD      . acyclovir (ZOVIRAX) 200 MG capsule 400 mg  400 mg Oral Daily Elwin Mocha, MD   400 mg at 03/05/17 1056  . albuterol (PROVENTIL) (2.5 MG/3ML) 0.083% nebulizer solution 2.5 mg  2.5 mg Nebulization Q2H PRN Elwin Mocha, MD      . ALPRAZolam Duanne Moron) tablet 0.5 mg  0.5 mg Oral QHS PRN Elwin Mocha, MD      . ceFEPIme (MAXIPIME) 1 g in dextrose 5 % 50 mL IVPB  1 g Intravenous Q8H Elwin Mocha, MD 100 mL/hr at 03/05/17 1304 1 g at 03/05/17 1304  . clindamycin (CLEOCIN) IVPB 600 mg  600 mg Intravenous Q6H Carlyle Basques, MD      . ezetimibe (ZETIA) tablet 10 mg  10 mg Oral Daily Elwin Mocha, MD   10 mg at 03/05/17 1056  . famotidine (PEPCID) tablet 10 mg  10 mg Oral BID Elwin Mocha, MD   10 mg at 03/05/17 1054  . guaiFENesin (MUCINEX) 12 hr tablet 600 mg  600 mg Oral BID Elwin Mocha, MD   600 mg at 03/05/17 1053  . insulin aspart (novoLOG) injection 0-9 Units  0-9 Units Subcutaneous TID WC Elwin Mocha, MD   1 Units at 03/04/17 1735  . ipratropium-albuterol (DUONEB) 0.5-2.5 (3) MG/3ML nebulizer solution 3 mL  3 mL Nebulization TID Georgette Shell, MD   3 mL at 03/05/17 1356  . latanoprost (XALATAN) 0.005 % ophthalmic solution 1 drop  1 drop Both Eyes QHS Elwin Mocha, MD   1 drop at 03/04/17 2125  . levothyroxine  (SYNTHROID, LEVOTHROID) tablet 175 mcg  175 mcg Oral QAC breakfast Elwin Mocha, MD   175 mcg at 03/05/17 4270  . lisinopril (PRINIVIL,ZESTRIL) tablet 5 mg  5 mg Oral QHS Elwin Mocha, MD   5 mg at 03/04/17 2125  . loratadine (CLARITIN) tablet 10 mg  10 mg Oral Daily Elwin Mocha, MD   10 mg at 03/05/17 1055  . MEDLINE mouth rinse  15 mL Mouth Rinse BID Unk Pinto, MD   15 mL at 03/05/17 1107  . methylPREDNISolone sodium succinate (SOLU-MEDROL) 125 mg/2 mL injection 60 mg  60 mg Intravenous Q6H Georgette Shell, MD   60 mg at 03/05/17 1100  . ondansetron (ZOFRAN)  tablet 4 mg  4 mg Oral Q6H PRN Elwin Mocha, MD       Or  . ondansetron [facility]) injection 4 mg  4 mg Intravenous Q6H PRN Elwin Mocha, MD      . PARoxetine (PAXIL) tablet 10 mg  10 mg Oral Daily Elwin Mocha, MD   10 mg at 03/05/17 1055  . primaquine tablet 30 mg  30 mg Oral Daily Carlyle Basques, MD      . sodium chloride flush (NS) 0.9 % injection 3 mL  3 mL Intravenous Q12H Elwin Mocha, MD   3 mL at 03/04/17 2126    REVIEW OF SYSTEMS:    10 Point review of Systems was done is negative except as noted above.  PHYSICAL EXAMINATION: ECOG PERFORMANCE STATUS: 1 - Symptomatic but completely ambulatory  . Vitals:   03/05/17 0736 03/05/17 1356  BP:    Pulse:    Resp:    Temp:    SpO2: 92% 97%   Filed Weights   03/15/2017 1748 03/04/17 1335 03/05/17 0551  Weight: 173 lb (78.5 kg) 179 lb 7.3 oz (81.4 kg) 180 lb 8.9 oz (81.9 kg)   .Body mass index is 27.45 kg/m.  GENERAL:alert, in no acute distress and comfortable SKIN:  Diffuse, scaly rash across the trunk, upper extremities, and face. No concerning lesions.  EYES: conjunctiva are pink and non-injected, sclera anicteric OROPHARYNX: MMM, no exudates, no oropharyngeal erythema or ulceration NECK: supple, no JVD LYMPH:  no palpable lymphadenopathy in the cervical, axillary or inguinal regions LUNGS: b/l scattered rales HEART: regular  rate & rhythm ABDOMEN:  normoactive bowel sounds , non tender, not distended. No palpable hepatosplenomegaly Extremity: 2+ b/l pitting pedal edema PSYCH: alert & oriented x 3 with fluent speech NEURO: no focal motor/sensory deficits  LABORATORY DATA:  I have reviewed the data as listed  . CBC Latest Ref Rng & Units 03/05/2017 03/04/2017 03/04/2017  WBC 4.0 - 10.5 K/uL 9.7 9.0 8.9  Hemoglobin 13.0 - 17.0 g/dL 11.1(L) 11.7(L) 11.6(L)  Hematocrit 39.0 - 52.0 % 33.5(L) 34.0(L) 34.4(L)  Platelets 150 - 400 K/uL 72(L) 84(L) 98(L)    . CMP Latest Ref Rng & Units 03/05/2017 03/04/2017 03/04/2017  Glucose 65 - 99 mg/dL 89 152(H) -  BUN 6 - 20 mg/dL 21(H) 21(H) -  Creatinine 0.61 - 1.24 mg/dL 0.73 0.90 0.94  Sodium 135 - 145 mmol/L 135 134(L) -  Potassium 3.5 - 5.1 mmol/L 3.9 4.4 -  Chloride 101 - 111 mmol/L 107 107 -  CO2 22 - 32 mmol/L 24 22 -  Calcium 8.9 - 10.3 mg/dL 8.1(L) 8.0(L) -  Total Protein 6.5 - 8.1 g/dL - - -  Total Bilirubin 0.3 - 1.2 mg/dL - - -  Alkaline Phos 38 - 126 U/L - - -  AST 15 - 41 U/L - - -  ALT 17 - 63 U/L - - -           Component     Latest Ref Rng & Units 08/22/2016 10/07/2016  C3 Complement     82 - 185 mg/dL 113   C4 Complement     15 - 53 mg/dL 32   Anit Nuclear Antibody(ANA)     NEGATIVE NEG   RPR     NON REAC NON REAC   Angiotensin-Converting Enzyme     9 - 67 U/L 17   ds DNA Ab     IU/mL 1   ANCA SCREEN  Negative   Hep C Virus Ab     0.0 - 0.9 s/co ratio  0.1   Component     Latest Ref Rng & Units 10/07/2016  Hep C Virus Ab     0.0 - 0.9 s/co ratio 0.1  Hepatitis B Surface Ag     Negative Negative  HIV Screen 4th Generation wRfx     Non Reactive Non Reactive                                   *[facility]*                  *[facility] [employer].                        [place],  [id number]                             [phone]  ------------------------------------------------------------------- Transthoracic Echocardiography  Patient:    Yosiel, Thieme MR #:       [id number] Study Date: 10/12/2016 Gender:     M Age:        25 Height:     172.7 cm Weight:     77.7 kg BSA:        1.94 m^2 Pt. Status: Room:   SONOGRAPHER  Dustin Flock, RCS  PERFORMING   [facility], Outpatient  ATTENDING    [place], [place], [place] Kishore  REFERRING    [place], [place] Kishore  cc:  ------------------------------------------------------------------- LV EF: 65% -   70%  ------------------------------------------------------------------- Indications:      Pre-op evaluation V728.1.  ------------------------------------------------------------------- History:   PMH:  Renal Amyloidosis, Hypertension, pre-chemo.  ------------------------------------------------------------------- Study Conclusions  - Left ventricle: The cavity size was normal. Wall thickness was   increased in a pattern of moderate LVH. Systolic function was   vigorous. The estimated ejection fraction was in the range of 65%   to 70%. Wall motion was normal; there were no regional wall   motion abnormalities. Doppler parameters are consistent with   abnormal left ventricular relaxation (grade 1 diastolic   dysfunction). - Mitral valve: There was trivial regurgitation. - Left atrium: The atrium was mildly dilated.    RADIOGRAPHIC STUDIES:  I have personally reviewed the radiological images as listed and agreed with the findings in the report. Dg Chest 1 View  Result Date: 03/05/2017 CLINICAL DATA:  77 year old male with increased shortness of breath. Hypoxia. EXAM: CHEST 1 VIEW COMPARISON:  Chest x-ray 02/28/2017. FINDINGS: Worsening widespread but asymmetrically distributed interstitial and airspace disease throughout the lungs bilaterally, most confluent in the right mid lung, compatible with  progressive multilobar bronchopneumonia. Probable small right pleural effusion. No left pleural effusion. Heart size appears normal. Upper mediastinal contours are within normal limits. IMPRESSION: 1. Progressively worsening multilobar bronchopneumonia, as above. 2. Probable small right pleural effusion. Electronically Signed   By: Vinnie Langton M.D.   On: 03/05/2017 07:59   Dg Chest 2 View  Result Date: 03/14/2017 CLINICAL DATA:  Shortness of breath. EXAM: CHEST  2 VIEW COMPARISON:  02/25/2017 and prior radiographs FINDINGS: Bilateral interstitial and airspace opacities, right greater  than left, has not significantly changed from today's radiograph but have increased since 02/20/2017. No pleural effusion or pneumothorax. The cardiomediastinal silhouette is unchanged. IMPRESSION: Bilateral interstitial and airspace opacities suspicious for pneumonia. These opacities have increased since 02/20/2017. Electronically Signed   By: Margarette Canada M.D.   On: 02/18/2017 19:17   Dg Chest 2 View  Result Date: 02/28/2017 CLINICAL DATA:  Shortness of breath EXAM: CHEST  2 VIEW COMPARISON:  02/20/2017, radiograph 02/02/2011 FINDINGS: Interim development of moderate diffuse right greater than left interstitial and alveolar opacity. No pleural effusion. Stable cardiomediastinal silhouette. No pneumothorax. IMPRESSION: 1. Moderate diffuse right greater than left interstitial and alveolar opacity, could reflect asymmetric edema or pneumonia. Electronically Signed   By: Donavan Foil M.D.   On: 03/14/2017 14:54   Dg Chest 2 View  Result Date: 02/21/2017 CLINICAL DATA:  Upper respiratory tract infection. EXAM: CHEST  2 VIEW COMPARISON:  Chest x-ray 02/02/2011. FINDINGS: Mediastinum and hilar structures are normal. Heart size normal. Diffuse bilateral mild interstitial prominence noted. Mild pneumonitis cannot be excluded. No pleural effusion or pneumothorax. Degenerative changes thoracic spine. IMPRESSION: Diffuse mild  bilateral interstitial prominence. Mild pneumonitis cannot be excluded. Electronically Signed   By: Marcello Moores  Register   On: 02/21/2017 06:15   Ct Chest W Contrast  Result Date: 03/02/2017 CLINICAL DATA:  77 year old male with history of unresolved pneumonia. Shortness of breath and coughing for the past several weeks. EXAM: CT CHEST WITH CONTRAST TECHNIQUE: Multidetector CT imaging of the chest was performed during intravenous contrast administration. CONTRAST:  133m ISOVUE-300 IOPAMIDOL (ISOVUE-300) INJECTION 61% COMPARISON:  No prior chest CT.  Chest x-ray 02/23/2017. FINDINGS: Cardiovascular: Heart size is normal. There is no significant pericardial fluid, thickening or pericardial calcification. There is aortic atherosclerosis, as well as atherosclerosis of the great vessels of the mediastinum and the coronary arteries, including calcified atherosclerotic plaque in the left anterior descending, left circumflex and right coronary arteries. Mediastinum/Nodes: No pathologically enlarged mediastinal or hilar lymph nodes. Small hiatal hernia. No axillary lymphadenopathy. Lungs/Pleura: Relatively diffuse but patchy areas of ground-glass attenuation, septal thickening and thickening of the peribronchovascular interstitium are noted scattered asymmetrically throughout the lungs bilaterally. Several areas demonstrate some immediate subpleural sparing, particularly in the periphery of the right lung. No pleural effusions. Upper Abdomen: Aortic atherosclerosis. Musculoskeletal: There are no aggressive appearing lytic or blastic lesions noted in the visualized portions of the skeleton. IMPRESSION: 1. The appearance of the lungs is most compatible with severe multilobar pneumonia. 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 2. Aortic atherosclerosis, in addition to 3 vessel coronary artery disease. Assessment for potential risk factor  modification, dietary therapy or pharmacologic therapy may be warranted, if clinically indicated. 3. Small hiatal hernia. Aortic Atherosclerosis (ICD10-I70.0). Electronically Signed   By: DVinnie LangtonM.D.   On: 03/04/2017 20:30    ASSESSMENT & PLAN:   77year old Caucasian male with  #1 B/L Multilobar Pulmonary infiltrates - likely pneumonia ?HCAP vs atypical infection (PJP) vs COP vs viral process. Plan -on broad spectrum abx for HCAP -would recommend ID consultation to determine need/choice of empiric coverage for PJP which is high on the differential -Induced sputum for PJP smear and PCR -urine legionella and pneumococus -viral respiratory panel -pulmonary consultation to evaluate for possible Bronch/BAL for diagnosis -Steroids per hospitalist/Pulmonary -IgG levels. -- would consider IVIG 0.5g/kg for hypogammaglobulinemia in setting of significant infection. -appreciate hospitalist/pulmonary/ID input.   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  -No hypercalcemia, normal creatinine level, no anemia. -No evidence of bone lesions on whole body skeletal survey. BM Bx- no evidence of myeloma  Echo shows normal ejection fraction with grade 1 diastolic dysfunction.  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  Hepatitis C, HIV and hepatitis B negative. Other workup as per nephrologist negative. Patient follows with Dr. Pearson Grippe  [facility].  #3 Grade 2 macular/desquamative rash over trunk, extremities, and face with no evidence of mucosal involvement. Now worsened since beginning cyclophosphamide after holding. Discontinued cyclophosphamide, and restarted velcade treatment with rash improvement. Rash has now resolved  PLAN  -holding all AL amyloidosis treatment -continue Prophylactic Acyclovir  #4 Leg swelling - due to  hypoalbuminemia from his nephrotic syndrome + Steroids. Improved some. Plan  -continue diuretics as per hospitalist All of the patients questions were answered with apparent satisfaction. The patient knows to call the clinic with any problems, questions or concerns.  I spent 30 minutes counseling the patient face to face. The total time spent in the appointment was 35 minutes and more than 50% was on counseling and direct patient cares.  Sullivan Lone MD [place] AAHIVMS [facility] Hematology/Oncology Physician [facility]  (Office):       [phone] (Work cell):  [phone] (Fax):           [phone]

## 2017-03-05 NOTE — Progress Notes (Signed)
Report called and given to Amy, rn in stepdown. Pt transferred to stepdown. Left unit in bed pushed by this writer and nurse tech accompanied by nephew and male family member. Arrived in good condition. Hale Bogus.

## 2017-03-06 ENCOUNTER — Inpatient Hospital Stay (HOSPITAL_COMMUNITY): Payer: Medicare Other

## 2017-03-06 DIAGNOSIS — E8581 Light chain (AL) amyloidosis: Secondary | ICD-10-CM

## 2017-03-06 DIAGNOSIS — J9601 Acute respiratory failure with hypoxia: Secondary | ICD-10-CM

## 2017-03-06 DIAGNOSIS — R0902 Hypoxemia: Secondary | ICD-10-CM

## 2017-03-06 DIAGNOSIS — D849 Immunodeficiency, unspecified: Secondary | ICD-10-CM

## 2017-03-06 DIAGNOSIS — J189 Pneumonia, unspecified organism: Secondary | ICD-10-CM

## 2017-03-06 DIAGNOSIS — A419 Sepsis, unspecified organism: Principal | ICD-10-CM

## 2017-03-06 LAB — RESPIRATORY PANEL BY PCR
ADENOVIRUS-RVPPCR: NOT DETECTED
Bordetella pertussis: NOT DETECTED
CORONAVIRUS HKU1-RVPPCR: NOT DETECTED
CORONAVIRUS NL63-RVPPCR: NOT DETECTED
CORONAVIRUS OC43-RVPPCR: NOT DETECTED
Chlamydophila pneumoniae: NOT DETECTED
Coronavirus 229E: NOT DETECTED
Influenza A: NOT DETECTED
Influenza B: NOT DETECTED
Metapneumovirus: NOT DETECTED
Mycoplasma pneumoniae: NOT DETECTED
PARAINFLUENZA VIRUS 1-RVPPCR: NOT DETECTED
PARAINFLUENZA VIRUS 2-RVPPCR: NOT DETECTED
PARAINFLUENZA VIRUS 3-RVPPCR: NOT DETECTED
Parainfluenza Virus 4: NOT DETECTED
RHINOVIRUS / ENTEROVIRUS - RVPPCR: NOT DETECTED
Respiratory Syncytial Virus: NOT DETECTED

## 2017-03-06 LAB — COMPREHENSIVE METABOLIC PANEL
ALK PHOS: 125 U/L (ref 38–126)
ALT: 32 U/L (ref 17–63)
AST: 61 U/L — AB (ref 15–41)
Albumin: 2.1 g/dL — ABNORMAL LOW (ref 3.5–5.0)
Anion gap: 6 (ref 5–15)
BUN: 19 mg/dL (ref 6–20)
CALCIUM: 8.2 mg/dL — AB (ref 8.9–10.3)
CO2: 23 mmol/L (ref 22–32)
CREATININE: 0.84 mg/dL (ref 0.61–1.24)
Chloride: 106 mmol/L (ref 101–111)
GFR calc Af Amer: >60 mL/min (ref >60)
GFR calc non Af Amer: >60 mL/min (ref >60)
Glucose, Bld: 134 mg/dL — ABNORMAL HIGH (ref 65–99)
Potassium: 4.2 mmol/L (ref 3.5–5.1)
SODIUM: 135 mmol/L (ref 135–145)
Total Bilirubin: 0.6 mg/dL (ref 0.3–1.2)
Total Protein: 4.7 g/dL — ABNORMAL LOW (ref 6.5–8.1)

## 2017-03-06 LAB — CBC
HCT: 33.2 % — ABNORMAL LOW (ref 39.0–52.0)
HEMOGLOBIN: 11.1 g/dL — AB (ref 13.0–17.0)
MCH: 32.1 pg (ref 26.0–34.0)
MCHC: 33.4 g/dL (ref 30.0–36.0)
MCV: 96 fL (ref 78.0–100.0)
Platelets: 46 10*3/uL — ABNORMAL LOW (ref 150–400)
RBC: 3.46 MIL/uL — AB (ref 4.22–5.81)
RDW: 14.4 % (ref 11.5–15.5)
WBC: 14.6 10*3/uL — AB (ref 4.0–10.5)

## 2017-03-06 LAB — PNEUMOCYSTIS JIROVECI SMEAR BY DFA: Pneumocystis jiroveci Ag: NEGATIVE

## 2017-03-06 LAB — GLUCOSE, CAPILLARY
GLUCOSE-CAPILLARY: 143 mg/dL — AB (ref 65–99)
GLUCOSE-CAPILLARY: 149 mg/dL — AB (ref 65–99)
Glucose-Capillary: 120 mg/dL — ABNORMAL HIGH (ref 65–99)
Glucose-Capillary: 123 mg/dL — ABNORMAL HIGH (ref 65–99)
Glucose-Capillary: 133 mg/dL — ABNORMAL HIGH (ref 65–99)

## 2017-03-06 LAB — POCT I-STAT 3, ART BLOOD GAS (G3+)
ACID-BASE DEFICIT: 2 mmol/L (ref 0.0–2.0)
BICARBONATE: 23.2 mmol/L (ref 20.0–28.0)
O2 Saturation: 100 %
Patient temperature: 98
TCO2: 24 mmol/L (ref 22–32)
pCO2 arterial: 39.8 mmHg (ref 32.0–48.0)
pH, Arterial: 7.371 (ref 7.350–7.450)
pO2, Arterial: 202 mmHg — ABNORMAL HIGH (ref 83.0–108.0)

## 2017-03-06 LAB — BLOOD GAS, ARTERIAL
Acid-base deficit: 0.1 mmol/L (ref 0.0–2.0)
Bicarbonate: 22.7 mmol/L (ref 20.0–28.0)
Drawn by: 422461
O2 Content: 15 L/min
O2 Saturation: 91.8 %
PCO2 ART: 32.2 mmHg (ref 32.0–48.0)
PH ART: 7.463 — AB (ref 7.350–7.450)
Patient temperature: 98.6
pO2, Arterial: 64.6 mmHg — ABNORMAL LOW (ref 83.0–108.0)

## 2017-03-06 LAB — C-REACTIVE PROTEIN: CRP: 3.9 mg/dL — ABNORMAL HIGH (ref <1.0)

## 2017-03-06 LAB — STREP PNEUMONIAE URINARY ANTIGEN: Strep Pneumo Urinary Antigen: NEGATIVE

## 2017-03-06 LAB — CRYPTOCOCCAL ANTIGEN
CRYPTOCOCCAL AG TITER: 160 — AB
Crypto Ag: POSITIVE — AB

## 2017-03-06 MED ORDER — SODIUM CHLORIDE 0.9 % IV SOLN
25.0000 ug/h | INTRAVENOUS | Status: DC
  Administered 2017-03-06: 50 ug/h via INTRAVENOUS
  Filled 2017-03-06: qty 50

## 2017-03-06 MED ORDER — FENTANYL CITRATE (PF) 100 MCG/2ML IJ SOLN
INTRAMUSCULAR | Status: AC
  Administered 2017-03-06: 100 ug
  Filled 2017-03-06: qty 2

## 2017-03-06 MED ORDER — MIDAZOLAM HCL 2 MG/2ML IJ SOLN
INTRAMUSCULAR | Status: AC
  Filled 2017-03-06: qty 2

## 2017-03-06 MED ORDER — FLUCONAZOLE IN SODIUM CHLORIDE 400-0.9 MG/200ML-% IV SOLN
400.0000 mg | INTRAVENOUS | Status: DC
  Administered 2017-03-06 – 2017-03-09 (×4): 400 mg via INTRAVENOUS
  Filled 2017-03-06 (×5): qty 200

## 2017-03-06 MED ORDER — ORAL CARE MOUTH RINSE
15.0000 mL | OROMUCOSAL | Status: DC
  Administered 2017-03-06 – 2017-03-11 (×47): 15 mL via OROMUCOSAL

## 2017-03-06 MED ORDER — DEXMEDETOMIDINE HCL IN NACL 200 MCG/50ML IV SOLN
0.4000 ug/kg/h | INTRAVENOUS | Status: DC
  Administered 2017-03-06: 0.5 ug/kg/h via INTRAVENOUS
  Filled 2017-03-06 (×2): qty 50

## 2017-03-06 MED ORDER — ASPIRIN 325 MG PO TABS: 325.0000 mg | ORAL_TABLET | Freq: Every day | ORAL | Status: DC

## 2017-03-06 MED ORDER — FENTANYL CITRATE (PF) 100 MCG/2ML IJ SOLN
INTRAMUSCULAR | Status: AC
  Administered 2017-03-06: 15:00:00
  Filled 2017-03-06: qty 2

## 2017-03-06 MED ORDER — LORAZEPAM 2 MG/ML IJ SOLN
0.5000 mg | Freq: Four times a day (QID) | INTRAMUSCULAR | Status: DC | PRN
Start: 2017-03-06 — End: 2017-03-06
  Administered 2017-03-06: 0.5 mg via INTRAVENOUS
  Filled 2017-03-06: qty 1

## 2017-03-06 MED ORDER — MIDAZOLAM HCL 2 MG/2ML IJ SOLN
INTRAMUSCULAR | Status: AC
  Administered 2017-03-06: 15:00:00
  Filled 2017-03-06: qty 2

## 2017-03-06 MED ORDER — STROKE: EARLY STAGES OF RECOVERY BOOK
Freq: Once | Status: DC
  Filled 2017-03-06: qty 1

## 2017-03-06 MED ORDER — ALPRAZOLAM 0.5 MG PO TABS: 0.5000 mg | ORAL_TABLET | Freq: Every evening | ORAL | Status: DC | PRN

## 2017-03-06 MED ORDER — SODIUM CHLORIDE 0.9 % IV SOLN
0.0000 ug/h | INTRAVENOUS | Status: DC
  Filled 2017-03-06: qty 50

## 2017-03-06 MED ORDER — ASPIRIN 300 MG RE SUPP: 300.0000 mg | Freq: Every day | RECTAL | Status: DC

## 2017-03-06 MED ORDER — CHLORHEXIDINE GLUCONATE 0.12% ORAL RINSE (MEDLINE KIT)
15.0000 mL | Freq: Two times a day (BID) | OROMUCOSAL | Status: DC
  Administered 2017-03-06 – 2017-03-10 (×9): 15 mL via OROMUCOSAL

## 2017-03-06 MED ORDER — FENTANYL CITRATE (PF) 100 MCG/2ML IJ SOLN: 50.0000 ug | Freq: Once | INTRAMUSCULAR | Status: DC

## 2017-03-06 MED ORDER — FENTANYL BOLUS VIA INFUSION
25.0000 ug | INTRAVENOUS | Status: DC | PRN
  Filled 2017-03-06: qty 25

## 2017-03-06 NOTE — Procedures (Addendum)
Intubation Procedure Note Gregory Galvan [id number] 1940-05-30  Procedure: Intubation Indications: Respiratory insufficiency  Procedure Details Consent: Risks of procedure as well as the alternatives and risks of each were explained to the (patient/caregiver).  Consent for procedure obtained. Time Out: Verified patient identification, verified procedure, site/side was marked, verified correct patient position, special equipment/implants available, medications/allergies/relevent history reviewed, required imaging and test results available.  Performed  Maximum sterile technique was used including gloves, gown, hand hygiene and mask.  MAC and 4  Versed 2  fent 100 Etomidate 30 in divided doses Rocuronium 50 (after intubation) for biting down on ETT  Evaluation Hemodynamic Status: BP stable throughout; O2 sats: transiently fell during during procedure Patient's Current Condition: stable Complications: No apparent complications Patient did tolerate procedure well. Chest X-ray ordered to verify placement.  CXR: pending.   [name] Elsworth Soho 03/06/2017

## 2017-03-06 NOTE — Progress Notes (Signed)
  He continued to have altered mental status with intermittent agitation He continued to require high flow oxygen Due to unequal pupils noted by RN, head CT was repeated which shows right posterior frontoparietal MCA stroke which is new compared to CT from 10 PM last night.  Neurology was consulted and recommended transfer to [facility] Detailed discussion and updated family including partner and [place] He was emergently intubated , bedside bronchoscopy performed and specimen sent for bacterial, fungal culture and PJP besides cytology. Chest x-ray was reviewed, ET tube position confirmed. Sedation initiated with Precedex and fentanyl drip  Additional critical care time independent of proceduresx 40 m  Whitney Bingaman V. Elsworth Soho MD

## 2017-03-06 NOTE — Consult Note (Addendum)
NEUROLOGY CONSULT NOTE  Referring Physician:Alva, [name], MD    Chief Complaint: Acute Stroke  HPI: QUINTARIUS FERNS is an 77 y.o. male, past medical history of hyperlipidemia, hypertension, BPH, hypothyroidism and renal amyloidosis receiving Velcade and dexamethasone who presented to [facility] hospital with progressive shortness of breath and interstitial lung changes on chest x-ray on 03/08/2017   Patient currently intubated and unable to give history of present illness or review of systems.  History obtained from chart, patient had a cold and was treated outpatient with doxycycline which did not improve his symptoms, he was evaluated by his oncologist Dr. Irene Limbo and sent to the ER.  On admission at [facility],  patient was diagnosed with bilateral pneumonia and treated with BiPAP, overnight post ed and was placed on nonrebreather mask.  Per his  family at bedside, since January of this year, patient started experiencing hearing loss which progressively got worse this week.  They state the patient was not confused but was very hard and of hearing.  Patient experienced a fall overnight on 1/21, CT of the head and x-ray of the shoulders at that time was negative.  Patient progressively continues to be somnolent and not easily arousable unresponsive to stimulus.  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    Patient continued to be somnolent with progressive worsening respiratory failure and has since been intubated and transferred to [facility] hospital.  Family bedside patient did not have a history of strokes or neurological problems.  He was ambulatory and independent.  Currently we are unable to assess his deficits secondary to being intubed and sedated.  LSN: unknown tPA Given: No: Did not meet time criteria.  Past Medical History:  Diagnosis Date  . BPH (benign prostatic  hyperplasia)   . Hyperlipidemia   . Hypertension   . Hypogonadism, male   . Pre-diabetes   . Prostatitis   . Thyroid disease     Past Surgical History:  Procedure Laterality Date  . APPENDECTOMY      Family History  Problem Relation Age of Onset  . Cancer Mother        breast  . Alzheimer's disease Mother   . Hypertension Father   . Heart disease Father   . Lymphoma Father   . Hypertension Brother   . Heart disease Brother   . Heart disease Brother   . Hypertension Brother   . Diabetes Brother    Social History:  reports that he quit smoking about 3 years ago. he has never used smokeless tobacco. He reports that he drinks about 16.8 oz of alcohol per week. He reports that he does not use drugs.  Allergies:  Allergies  Allergen Reactions  . 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 Sulfonamide Derivatives Itching and Swelling  . Penicillins Swelling and Rash    Has patient had a PCN reaction causing immediate rash, facial/tongue/throat swelling, SOB or lightheadedness with hypotension: YES Has patient had a PCN reaction causing severe rash involving mucus membranes or skin necrosis:YES (PENILE SWELLING AND RASH) Has patient had a PCN reaction that required hospitalization:YES Has patient had a PCN reaction occurring within the last 10 years: nO If all of the above answers are "NO", then may proceed with Cephalosporin use.     Medications:  Current Facility-Administered Medications:  .   stroke: mapping our early stages  of recovery book, , Does not apply, Once, Jacob Moores, NP .  acetaminophen (TYLENOL) tablet 650 mg, 650 mg, Oral, Q6H PRN **OR** acetaminophen (TYLENOL) suppository 650 mg, 650 mg, Rectal, Q6H PRN, Elwin Mocha, MD .  acyclovir (ZOVIRAX) 200 MG capsule 400 mg, 400 mg, Oral, Daily, Elwin Mocha, MD, 400 mg at 03/05/17 1056 .  albuterol (PROVENTIL) (2.5 MG/3ML) 0.083% nebulizer solution 2.5 mg, 2.5 mg,  Nebulization, Q2H PRN, Elwin Mocha, MD .  aspirin suppository 300 mg, 300 mg, Rectal, Daily **OR** aspirin tablet 325 mg, 325 mg, Oral, Daily, [place], Pearl, NP .  ceFEPIme (MAXIPIME) 1 g in dextrose 5 % 50 mL IVPB, 1 g, Intravenous, Q8H, Elwin Mocha, MD, Stopped at 03/06/17 1507 .  chlorhexidine gluconate (MEDLINE KIT) (PERIDEX) 0.12 % solution 15 mL, 15 mL, Mouth Rinse, BID, Sampson Goon, MD .  dexmedetomidine (PRECEDEX) 200 MCG/50ML (4 mcg/mL) infusion, 0.4-0.8 mcg/kg/hr, Intravenous, Titrated, Rigoberto Noel, MD, Stopped at 03/06/17 1800 .  famotidine (PEPCID) tablet 10 mg, 10 mg, Oral, BID, Elwin Mocha, MD, 10 mg at 03/05/17 2238 .  fentaNYL (SUBLIMAZE) 2,500 mcg in sodium chloride 0.9 % 250 mL (10 mcg/mL) infusion, 25-400 mcg/hr, Intravenous, Continuous, Rigoberto Noel, MD, Stopped at 03/06/17 1800 .  fentaNYL (SUBLIMAZE) bolus via infusion 25 mcg, 25 mcg, Intravenous, Q1H PRN, Rigoberto Noel, MD .  fentaNYL (SUBLIMAZE) injection 50 mcg, 50 mcg, Intravenous, Once, Kara Mead V, MD .  fluconazole (DIFLUCAN) IVPB 400 mg, 400 mg, Intravenous, Q24H, Carlyle Basques, MD, Stopped at 03/06/17 1411 .  guaiFENesin (MUCINEX) 12 hr tablet 600 mg, 600 mg, Oral, BID, Elwin Mocha, MD, 600 mg at 03/05/17 2238 .  insulin aspart (novoLOG) injection 0-9 Units, 0-9 Units, Subcutaneous, TID WC, Elwin Mocha, MD, 1 Units at 03/06/17 1213 .  ipratropium-albuterol (DUONEB) 0.5-2.5 (3) MG/3ML nebulizer solution 3 mL, 3 mL, Nebulization, TID, Georgette Shell, MD, 3 mL at 03/06/17 1412 .  latanoprost (XALATAN) 0.005 % ophthalmic solution 1 drop, 1 drop, Both Eyes, QHS, Elwin Mocha, MD, 1 drop at 03/05/17 2239 .  levothyroxine (SYNTHROID, LEVOTHROID) tablet 175 mcg, 175 mcg, Oral, QAC breakfast, Elwin Mocha, MD, 175 mcg at 03/05/17 7564 .  MEDLINE mouth rinse, 15 mL, Mouth Rinse, 10 times per day, Sampson Goon, MD .  methylPREDNISolone sodium succinate (SOLU-MEDROL) 125 mg/2  mL injection 60 mg, 60 mg, Intravenous, Q6H, Georgette Shell, MD, 60 mg at 03/06/17 1211 .  midazolam (VERSED) 2 MG/2ML injection, , , ,  .  ondansetron (ZOFRAN) tablet 4 mg, 4 mg, Oral, Q6H PRN **OR** ondansetron (ZOFRAN) injection 4 mg, 4 mg, Intravenous, Q6H PRN, Elwin Mocha, MD .  PARoxetine (PAXIL) tablet 10 mg, 10 mg, Oral, Daily, Elwin Mocha, MD, 10 mg at 03/05/17 1055 .  sodium chloride flush (NS) 0.9 % injection 3 mL, 3 mL, Intravenous, Q12H, Elwin Mocha, MD, 3 mL at 03/05/17 1513 .  sodium chloride HYPERTONIC 3 % nebulizer solution 4 mL, 4 mL, Nebulization, QID PRN, Renee Pain, MD .  vancomycin (VANCOCIN) 1,500 mg in sodium chloride 0.9 % 500 mL IVPB, 1,500 mg, Intravenous, Q24H, Adrian Saran, [facility], Stopped at 03/05/17 1945  ROS: Unable to accurately assess, patient currently sedated and intubated on vent.  Physical Examination: Blood pressure 106/79, pulse 70, temperature 98.8 F (37.1 C), temperature source Axillary, resp. rate 16, height _0  (1.753 m), weight 79.8 kg (175 lb 14.8 oz), SpO2 99 %.  HEENT-multiple bruises on head post fall, conjunctival hemorrhage Cardiovascular- S1-S2 audible, pulses palpable throughout   Lungs-currently intubated on mechanical vent. Patient breathing above the ventilator. Abdomen- All 4 quadrants palpated and nontender Extremities- Warm, dry and intact Musculoskeletal-no  edema Skin-warm and dry, with multiple petechiae lesions on forehead upper arms and a large cluster on back.  Neurological Examination Mental Status: Sedated on Precedex and intubated on mechanical vent, prior to assessment sedation was held. Cranial Nerves: II: Discs flat bilaterally; Visual fields grossly normal,  III,IV, VI: Pupils equal and reactive to light and accommodation but sluggish, negative doll's eyes minimal corneal reflex on the left and none on the right. V,VII: Face appears symmetric but unable to accurately assess secondary to  intubation tubes  Motor: Patient currently intubated and sedated and without any voluntary motor movements even with strong nox stimulus Tone and bulk:normal tone throughout; no atrophy noted Sensory: No reaction to harsh even while off sedation.   Deep Tendon Reflexes and Plantars: Mute Cerebellar: Unable to assess due to sedation Gait: Unable to assess  Results for orders placed or performed during the hospital encounter of 03/06/2017 (from the past 48 hour(s))  Glucose, capillary     Status: Abnormal   Collection Time: 03/04/17  9:06 PM  Result Value Ref Range   Glucose-Capillary 105 (H) 65 - 99 mg/dL  CBC     Status: Abnormal   Collection Time: 03/05/17  6:20 AM  Result Value Ref Range   WBC 9.7 4.0 - 10.5 K/uL   RBC 3.48 (L) 4.22 - 5.81 MIL/uL   Hemoglobin 11.1 (L) 13.0 - 17.0 g/dL   HCT 33.5 (L) 39.0 - 52.0 %   MCV 96.3 78.0 - 100.0 fL   MCH 31.9 26.0 - 34.0 pg   MCHC 33.1 30.0 - 36.0 g/dL   RDW 14.4 11.5 - 15.5 %   Platelets 72 (L) 150 - 400 K/uL    Comment: RESULT REPEATED AND VERIFIED SPECIMEN CHECKED FOR CLOTS CONSISTENT WITH PREVIOUS RESULT   Basic metabolic panel     Status: Abnormal   Collection Time: 03/05/17  6:20 AM  Result Value Ref Range   Sodium 135 135 - 145 mmol/L   Potassium 3.9 3.5 - 5.1 mmol/L   Chloride 107 101 - 111 mmol/L   CO2 24 22 - 32 mmol/L   Glucose, Bld 89 65 - 99 mg/dL   BUN 21 (H) 6 - 20 mg/dL   Creatinine, Ser 0.73 0.61 - 1.24 mg/dL   Calcium 8.1 (L) 8.9 - 10.3 mg/dL   GFR calc non Af Amer >60 >60 mL/min   GFR calc Af Amer >60 >60 mL/min    Comment: (NOTE) The eGFR has been calculated using the CKD EPI equation. This calculation has not been validated in all clinical situations. eGFR's persistently <60 mL/min signify possible Chronic Kidney Disease.    Anion gap 4 (L) 5 - 15  Glucose, capillary     Status: None   Collection Time: 03/05/17  7:24 AM  Result Value Ref Range   Glucose-Capillary 82 65 - 99 mg/dL  Blood gas, arterial      Status: Abnormal   Collection Time: 03/05/17  8:40 AM  Result Value Ref Range   FIO2 1.00    Delivery systems NON-REBREATHER OXYGEN MASK    pH, Arterial 7.465 (H) 7.350 - 7.450   pCO2 arterial 31.2 (L) 32.0 - 48.0 mmHg   pO2, Arterial 68.4 (L) 83.0 - 108.0 mmHg   Bicarbonate 22.1 20.0 -  28.0 mmol/L   Acid-base deficit 0.5 0.0 - 2.0 mmol/L   O2 Saturation 92.9 %   Patient temperature 98.6    Collection site LEFT RADIAL    Drawn by [id number]    Sample type ARTERIAL DRAW    Allens test (pass/fail) PASS PASS  Glucose, capillary     Status: Abnormal   Collection Time: 03/05/17 12:13 PM  Result Value Ref Range   Glucose-Capillary 100 (H) 65 - 99 mg/dL  Pneumocystis smear by DFA     Status: None   Collection Time: 03/05/17 12:47 PM  Result Value Ref Range   Specimen Source-PJSRC EXPECTORATED SPUTUM    Pneumocystis jiroveci Ag NEGATIVE     Comment: Performed at [facility]  Respiratory Panel by PCR     Status: None   Collection Time: 03/05/17  2:10 PM  Result Value Ref Range   Adenovirus NOT DETECTED NOT DETECTED   Coronavirus 229E NOT DETECTED NOT DETECTED   Coronavirus HKU1 NOT DETECTED NOT DETECTED   Coronavirus NL63 NOT DETECTED NOT DETECTED   Coronavirus OC43 NOT DETECTED NOT DETECTED   Metapneumovirus NOT DETECTED NOT DETECTED   Rhinovirus / Enterovirus NOT DETECTED NOT DETECTED   Influenza A NOT DETECTED NOT DETECTED   Influenza B NOT DETECTED NOT DETECTED   Parainfluenza Virus 1 NOT DETECTED NOT DETECTED   Parainfluenza Virus 2 NOT DETECTED NOT DETECTED   Parainfluenza Virus 3 NOT DETECTED NOT DETECTED   Parainfluenza Virus 4 NOT DETECTED NOT DETECTED   Respiratory Syncytial Virus NOT DETECTED NOT DETECTED   Bordetella pertussis NOT DETECTED NOT DETECTED   Chlamydophila pneumoniae NOT DETECTED NOT DETECTED   Mycoplasma pneumoniae NOT DETECTED NOT DETECTED  Influenza panel by PCR (type A & B)     Status: None   Collection Time: 03/05/17  2:10 PM  Result  Value Ref Range   Influenza A By PCR NEGATIVE NEGATIVE   Influenza B By PCR NEGATIVE NEGATIVE    Comment: (NOTE) The Xpert Xpress Flu assay is intended as an aid in the diagnosis of  influenza and should not be used as a sole basis for treatment.  This  assay is FDA approved for nasopharyngeal swab specimens only. Nasal  washings and aspirates are unacceptable for Xpert Xpress Flu testing.   Sedimentation rate     Status: Abnormal   Collection Time: 03/05/17  3:14 PM  Result Value Ref Range   Sed Rate 54 (H) 0 - 16 mm/hr  C-reactive protein     Status: Abnormal   Collection Time: 03/05/17  3:14 PM  Result Value Ref Range   CRP 3.9 (H) <1.0 mg/dL    Comment: Performed at [facility] [address], [place], [place] [address]  Cryptococcal antigen     Status: Abnormal   Collection Time: 03/05/17  3:14 PM  Result Value Ref Range   Crypto Ag POSITIVE (A) NEGATIVE   Cryptococcal Ag Titer 160 (A) NOT INDICATED    Comment: CRITICAL RESULT CALLED TO, READ BACK BY AND VERIFIED WITH: RN Elveria Rising [phone] MLM Performed at [facility], [address] [place], [place] [address]   Procalcitonin - Baseline     Status: None   Collection Time: 03/05/17  3:14 PM  Result Value Ref Range   Procalcitonin 0.18 ng/mL    Comment:        Interpretation: PCT (Procalcitonin) <= 0.5 ng/mL: Systemic infection (sepsis) is not likely. Local bacterial infection is  possible. (NOTE)       Sepsis PCT Algorithm           Lower Respiratory Tract                                      Infection PCT Algorithm    ----------------------------     ----------------------------         PCT < 0.25 ng/mL                PCT < 0.10 ng/mL         Strongly encourage             Strongly discourage   discontinuation of antibiotics    initiation of antibiotics    ----------------------------     -----------------------------       PCT 0.25 - 0.50 ng/mL            PCT 0.10 - 0.25 ng/mL               OR        >80% decrease in PCT            Discourage initiation of                                            antibiotics      Encourage discontinuation           of antibiotics    ----------------------------     -----------------------------         PCT >= 0.50 ng/mL              PCT 0.26 - 0.50 ng/mL               AND        <80% decrease in PCT             Encourage initiation of                                             antibiotics       Encourage continuation           of antibiotics    ----------------------------     -----------------------------        PCT >= 0.50 ng/mL                  PCT > 0.50 ng/mL               AND         increase in PCT                  Strongly encourage                                      initiation of antibiotics    Strongly encourage escalation           of antibiotics                                     -----------------------------  PCT <= 0.25 ng/mL                                                 OR                                        > 80% decrease in PCT                                     Discontinue / Do not initiate                                             antibiotics   Lactate dehydrogenase     Status: Abnormal   Collection Time: 03/05/17  3:14 PM  Result Value Ref Range   LDH 910 (H) 98 - 192 U/L  Glucose, capillary     Status: Abnormal   Collection Time: 03/05/17  6:57 PM  Result Value Ref Range   Glucose-Capillary 144 (H) 65 - 99 mg/dL  Glucose, capillary     Status: Abnormal   Collection Time: 03/05/17  9:52 PM  Result Value Ref Range   Glucose-Capillary 144 (H) 65 - 99 mg/dL  Strep pneumoniae urinary antigen     Status: None   Collection Time: 03/06/17  2:42 AM  Result Value Ref Range   Strep Pneumo Urinary Antigen NEGATIVE NEGATIVE    Comment:        Infection due to S. pneumoniae cannot be absolutely ruled out since the antigen present may be below the detection limit of the  test. Performed at [facility], [address] [place], [place] [address]   Blood gas, arterial     Status: Abnormal   Collection Time: 03/06/17  3:34 AM  Result Value Ref Range   O2 Content 15.0 L/min   Delivery systems HIGH FLOW NASAL CANNULA    pH, Arterial 7.463 (H) 7.350 - 7.450   pCO2 arterial 32.2 32.0 - 48.0 mmHg   pO2, Arterial 64.6 (L) 83.0 - 108.0 mmHg   Bicarbonate 22.7 20.0 - 28.0 mmol/L   Acid-base deficit 0.1 0.0 - 2.0 mmol/L   O2 Saturation 91.8 %   Patient temperature 98.6    Collection site LEFT RADIAL    Drawn by [id number]    Sample type ARTERIAL DRAW    Allens test (pass/fail) PASS PASS  Glucose, capillary     Status: Abnormal   Collection Time: 03/06/17  7:50 AM  Result Value Ref Range   Glucose-Capillary 120 (H) 65 - 99 mg/dL  CBC     Status: Abnormal   Collection Time: 03/06/17 11:42 AM  Result Value Ref Range   WBC 14.6 (H) 4.0 - 10.5 K/uL   RBC 3.46 (L) 4.22 - 5.81 MIL/uL   Hemoglobin 11.1 (L) 13.0 - 17.0 g/dL   HCT 33.2 (L) 39.0 - 52.0 %   MCV 96.0 78.0 - 100.0 fL   MCH 32.1 26.0 - 34.0 pg   MCHC 33.4 30.0 - 36.0 g/dL   RDW 14.4 11.5 - 15.5 %  Platelets 46 (L) 150 - 400 K/uL    Comment: REPEATED TO VERIFY SPECIMEN CHECKED FOR CLOTS CONSISTENT WITH PREVIOUS RESULT   Comprehensive metabolic panel     Status: Abnormal   Collection Time: 03/06/17 11:42 AM  Result Value Ref Range   Sodium 135 135 - 145 mmol/L   Potassium 4.2 3.5 - 5.1 mmol/L   Chloride 106 101 - 111 mmol/L   CO2 23 22 - 32 mmol/L   Glucose, Bld 134 (H) 65 - 99 mg/dL   BUN 19 6 - 20 mg/dL   Creatinine, Ser 0.84 0.61 - 1.24 mg/dL   Calcium 8.2 (L) 8.9 - 10.3 mg/dL   Total Protein 4.7 (L) 6.5 - 8.1 g/dL   Albumin 2.1 (L) 3.5 - 5.0 g/dL   AST 61 (H) 15 - 41 U/L   ALT 32 17 - 63 U/L   Alkaline Phosphatase 125 38 - 126 U/L   Total Bilirubin 0.6 0.3 - 1.2 mg/dL   GFR calc non Af Amer >60 >60 mL/min   GFR calc Af Amer >60 >60 mL/min    Comment: (NOTE) The eGFR has been  calculated using the CKD EPI equation. This calculation has not been validated in all clinical situations. eGFR's persistently <60 mL/min signify possible Chronic Kidney Disease.    Anion gap 6 5 - 15  Glucose, capillary     Status: Abnormal   Collection Time: 03/06/17 11:55 AM  Result Value Ref Range   Glucose-Capillary 123 (H) 65 - 99 mg/dL  I-STAT 3, arterial blood gas (G3+)     Status: Abnormal   Collection Time: 03/06/17  5:51 PM  Result Value Ref Range   pH, Arterial 7.371 7.350 - 7.450   pCO2 arterial 39.8 32.0 - 48.0 mmHg   pO2, Arterial 202.0 (H) 83.0 - 108.0 mmHg   Bicarbonate 23.2 20.0 - 28.0 mmol/L   TCO2 24 22 - 32 mmol/L   O2 Saturation 100.0 %   Acid-base deficit 2.0 0.0 - 2.0 mmol/L   Patient temperature 98.0 F    Collection site RADIAL, ALLEN'S TEST ACCEPTABLE    Drawn by RT    Sample type ARTERIAL    CT head - non contrast 03/06/17 IMPRESSION: Acute right posterior frontal-parietal nonhemorrhagic MCA distribution infarct superimposed on chronic microvascular ischemia and chronic bilateral basal ganglial lacunar infarcts.  Assessment: Yoel Kaufhold 77 y.o. male who was admitted to [facility] on 02/28/2017, bilateral pneumonia.  Patient explains a fall while in hospital, initial CT post fall was normal.  Patient progressively became somnolent and not easily arousable, repeat CT of the head revealed acute right posterior frontoparietal MCA stroke.  Patient was transferred at [facility]. due to persistent somnolence, altered mental status and high risk for respiratory failure patient was intubated.  1. Acute right posterior frontoparietal MCA CVA-in patient status post fall in the hospital.  CT also reports infarct superimposed on chronic microvascular ischemic and lacunar infarct.  Patient with immune suppression secondary to renal AL amyloidosis and being currently on chemo, making him a high risk for PRES. we will continue further stroke workup with imaging  studies MRI brain as well as MRA of the head and neck. 2. Acute hypoxemic respiratory failure in patient with healthcare associated pneumonia patient currently sedated and-intubated on mechanical vent.  Noted to be breathing above his vent. 3.  Renal amyloidosis  Patient has had about 12 treatments of chemotherapy -last dose was Wednesday. 4. Immunocompromised state. 5. Sepsis secondary to pneumonia. 6.  Severe thrombocytopenia with platelets at 46-likely secondary to being on high doses of chemo, patient alsonoted to have a hemorrhagic petechiae on back and arms.  Plan: 1. HgbA1c, fasting lipid panel 2. MRI, MRA  of the brain without contrast 3. PT consult, OT consult, Speech consult 4. Echocardiogram pending 6.  Unable to administer prophylactic therapy -Antiplatelet med due to severe thrombocytopenia  7. Risk factor modification 8. Telemetry monitoring 9. Frequent neuro checks 10.  Aggressive ICU management for the metabolic comorbidities.  We will continue to monitor patient but likely will be able to accurately assess deficits when off sedation.  Stroke team to follow in the AM unless MRI negative for stroke, in which case Neurology team will follow.  Jacob Moores DNP Neuro-hospitalist Team [phone] 03/06/2017, 6:32 PM  Attending Neurohospitalist Addendum Patient seen and examined with APP/Resident. Agree with the history and physical as documented above. Agree with the plan as documented, which I helped formulate. I have independently reviewed the chart, obtained history, review of systems and examined the patient.I have personally reviewed pertinent head/neck/spine imaging (CT/MRI).  CTH last night - no acute change. CTH this afternoon - evolving RMCA stroke. Not a candidate for tPA due to out of window. Needs stroke w/u.  Please feel free to call with any questions. --- Amie Portland, MD [facility] Pager: [phone]  If 7pm to 7am, please call on  call as listed on AMION.  CRITICAL CARE ATTESTATION This patient is critically ill and at significant risk of neurological worsening, death and care requires constant monitoring of vital signs, hemodynamics,respiratory and cardiac monitoring. I spent 35  minutes of neurocritical care time performing neurological assessment, discussion with family, other specialists and medical decision making of high complexityin the care of  this patient.

## 2017-03-06 NOTE — Progress Notes (Signed)
CRITICAL VALUE ALERT  Critical Value:  Cryptococcal Ag Titer Positive 160  Date & Time Notied:  03/06/17 @ 0820  Provider Notified: Dr. Elsworth Soho & Dr. Rodena Piety  Orders Received/Actions taken:

## 2017-03-06 NOTE — Progress Notes (Signed)
Pharmacy Antibiotic Note  Gregory Galvan is a 77 y.o. male admitted on 02/20/2017 with progressive pneumonia.  PCN allergy noted but has been tolerating Cefepime.  ID following and wants to continue HCAP coverage.  Pharmacy has been consulted for Vancomycin & Cefepime dosing.  Day #4 Cefepime and Vancomycin.  Day #1 fluconazole.   Plan: Continue Cefepime 1gm IV q8h Vancomycin 1500 mg IV q24h. Will check peak and trough levels following this 4th dose tonight to obtain AUC (goal 400-500).  Height: 5\' 8"  (172.7 cm) Weight: 172 lb 13.5 oz (78.4 kg) IBW/kg (Calculated) : 68.4  Temp (24hrs), Avg:98.1 F (36.7 C), Min:97.3 F (36.3 C), Max:98.7 F (37.1 C)  Recent Labs  Lab 03/14/2017 1833 03/08/2017 1854 02/14/2017 2048 03/04/17 0043 03/04/17 0500 03/05/17 0620 03/06/17 1142  WBC 11.8*  --   --  8.9 9.0 9.7 14.6*  CREATININE 1.07  --   --  0.94 0.90 0.73 0.84  LATICACIDVEN  --  3.39* 1.48  --   --   --   --     Estimated Creatinine Clearance: 74.6 mL/min (by C-G formula based on SCr of 0.84 mg/dL).    Allergies  Allergen Reactions  . 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 Sulfonamide Derivatives Itching and Swelling  . Penicillins Swelling and Rash    Has patient had a PCN reaction causing immediate rash, facial/tongue/throat swelling, SOB or lightheadedness with hypotension: YES Has patient had a PCN reaction causing severe rash involving mucus membranes or skin necrosis:YES (PENILE SWELLING AND RASH) Has patient had a PCN reaction that required hospitalization:YES Has patient had a PCN reaction occurring within the last 10 years: nO If all of the above answers are "NO", then may proceed with Cephalosporin use.     Antimicrobials this admission:  1/18 Vanc>> 1/18 Cefepime>> 1/21 Fluconazole >>  Dose adjustments this admission:   Microbiology results:  1/18 BCx: ngtd 1/18 MRSA PCR: negative 1/20 resp virus panel: neg,  influenza panel: neg 1/20 sputum Pneumocytis smear: neg, PCR: IP 1/20 Cryptococcal Ag: POSITIVE (per ID note, in an immunocompromised host this could represent cryptococcal pneumonia/pneumonitis)  Thank you for allowing pharmacy to be a part of this patient's care.   Hershal Coria, PharmD, BCPS Pager: [phone] 03/06/2017 2:27 PM

## 2017-03-06 NOTE — Progress Notes (Addendum)
[facility] for Infectious Disease    Date of Admission:  03/05/2017   Total days of antibiotics 4        Day 3 cefepime        Day 3 vancomycin           ID: Gregory Galvan is a 76 y.o. male with renal amyloidosis receiving velcade and dexamethasone with progressive shortness of breath, interstitial lung changes on cxr Active Problems:   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CC)   Sepsis due to pneumonia ([place])   HCAP (healthcare-associated pneumonia)   Immunocompromised state ([place])   Acute hypoxemic respiratory failure (HCC)    Subjective: Afebrile but confused overnight. He was fine when his family left at 8:30 ,fell out of bed/yelled for help around 9:30 pm and has been progressively confused. Found to have landed on left arm/unclear if hit his head He is not alert for interview. Appears to have progressive tachypnea and accessory muscle use   Labs revealed - SCrAg+ of 1:160 --- family reports that he used to garden  Medications:  . acyclovir  400 mg Oral Daily  . ezetimibe  10 mg Oral Daily  . famotidine  10 mg Oral BID  . guaiFENesin  600 mg Oral BID  . insulin aspart  0-9 Units Subcutaneous TID WC  . ipratropium-albuterol  3 mL Nebulization TID  . latanoprost  1 drop Both Eyes QHS  . levothyroxine  175 mcg Oral QAC breakfast  . loratadine  10 mg Oral Daily  . mouth rinse  15 mL Mouth Rinse BID  . methylPREDNISolone (SOLU-MEDROL) injection  60 mg Intravenous Q6H  . PARoxetine  10 mg Oral Daily  . sodium chloride flush  3 mL Intravenous Q12H    Objective: Vital signs in last 24 hours: Temp:  [97.3 F (36.3 C)-98.7 F (37.1 C)] 97.3 F (36.3 C) (01/21 0750) Pulse Rate:  [85-121] 93 (01/21 0900) Resp:  [17-33] 22 (01/21 0900) BP: (106-164)/(76-109) 119/76 (01/21 0900) SpO2:  [74 %-100 %] 97 % (01/21 0900) Weight:  [172 lb 13.5 oz (78.4 kg)-185 lb 3 oz (84 kg)] 172 lb 13.5 oz (78.4 kg) (01/21 6962)  Physical Exam  Constitutional: He is sleeping. He appears well-developed  and well-nourished. No distress.  HENT: left eye inferior subconjunctival hemorrhage (new) Mouth/Throat: Oropharynx is clear and moist. No oropharyngeal exudate.  Cardiovascular: Normal rate, regular rhythm and normal heart sounds. Exam reveals no gallop and no friction rub.  No murmur heard.  Neck: acc m. Use, no nuchal rigidity Pulmonary/Chest: tachy. With mild insp wheezing Abdominal: Soft. Bowel sounds are decreased. He exhibits no distension. There is no tenderness.  Lymphadenopathy:  no cervical adenopathy.  Neurological: moved right arm and legs independently during exam Skin: Skin is warm and dry. No rash noted. No erythema.  Ext: no LE edema  Lab Results Recent Labs    03/04/17 0500 03/05/17 0620  WBC 9.0 9.7  HGB 11.7* 11.1*  HCT 34.0* 33.5*  NA 134* 135  K 4.4 3.9  CL 107 107  CO2 22 24  BUN 21* 21*  CREATININE 0.90 0.73   Liver Panel Recent Labs    02/22/2017 1833  PROT 6.1*  ALBUMIN 2.6*  AST 71*  ALT 42  ALKPHOS 114  BILITOT <0.1*   Sedimentation Rate Recent Labs    03/05/17 1514  ESRSEDRATE 54*   C-Reactive Protein Recent Labs    03/05/17 1514  CRP 3.9*   SCrAg 1:160 Microbiology: Sputum 1/20 pendnig Studies/Results:  Dg Chest 1 View  Result Date: 03/05/2017 CLINICAL DATA:  77 year old male with increased shortness of breath. Hypoxia. EXAM: CHEST 1 VIEW COMPARISON:  Chest x-ray 03/16/2017. FINDINGS: Worsening widespread but asymmetrically distributed interstitial and airspace disease throughout the lungs bilaterally, most confluent in the right mid lung, compatible with progressive multilobar bronchopneumonia. Probable small right pleural effusion. No left pleural effusion. Heart size appears normal. Upper mediastinal contours are within normal limits. IMPRESSION: 1. Progressively worsening multilobar bronchopneumonia, as above. 2. Probable small right pleural effusion. Electronically Signed   By: Vinnie Langton M.D.   On: 03/05/2017 07:59   Ct  Head Wo Contrast  Result Date: 03/05/2017 CLINICAL DATA:  Fall EXAM: CT HEAD WITHOUT CONTRAST TECHNIQUE: Contiguous axial images were obtained from the base of the skull through the vertex without intravenous contrast. COMPARISON:  None. FINDINGS: Brain: No mass lesion, intraparenchymal hemorrhage or extra-axial collection. No evidence of acute cortical infarct. There is periventricular hypoattenuation compatible with chronic microvascular disease. Old bilateral basal ganglia lacunar infarcts. Vascular: No hyperdense vessel or unexpected calcification. Skull: Normal visualized skull base, calvarium and extracranial soft tissues. Sinuses/Orbits: No sinus fluid levels or advanced mucosal thickening. No mastoid effusion. Normal orbits. 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 Electronically Signed   By: Ulyses Jarred M.D.   On: 03/05/2017 22:35   Dg Shoulder Left Port  Result Date: 03/05/2017 CLINICAL DATA:  Pain after fall. EXAM: LEFT SHOULDER - 1 VIEW COMPARISON:  August 01, 2011. FINDINGS: Degenerative changes are seen at the [facility] joint. The humeral head is high riding consistent with a chronic rotator cuff tear. No acute fractures are identified. No dislocation. IMPRESSION: No fracture or dislocation. High riding humeral head consistent with a chronic rotator cuff tear. Electronically Signed   By: Dorise Bullion III M.D   On: 03/05/2017 23:04     Assessment/Plan: 77yo M with progressive interstitial pneumonia, hypoxia of unclear etiology, possibly atypical infection, vs amyloidosis of lung vs. penumonitis from velcade/bortezomib.  Respiratory distress - he was started on high dose steroids yesterda but not seeing significantly much improvement. He appears more tachypnic with acc muscle use. - continued on his HCAP regimen of vancomycin plus cefepime for now - if he gets intubated from fatigue, please do a bronchoscopy so  that specimen can be sent for PJP stain (from lower secretions) as well as aerobic and fungal culture. - upper resp specimen pneumocystis stain is pending  - recent infectious work up showed serum cr Ag of 1:160 which clouds the picture since in an immunocompromised host this could represent cryptococcal pneumonia/pneumonitis. Cryptococcal pneumonia is thought to be parenchymal disease but can be disseminated, not sure if it would have diffuse interstitial pattern. Ideally would like to have a pulmonary specimen also isolating cryptococcus on culture.  - since he is clinically worsening, will start fluconazole 400mg  Iv daily temporarily until further cultures can be collected - will check HIV antibody test   Confusion overnight, continued solomnence this arm = agree with repeating head CT to see if any changes from last nights stat CT. Unable to do full neuro exam- he moved right arm, and legs during exam but did not follow commands.  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 Spoke with dr Rodena Piety who is arranging transfer to [facility].  [facility] for Infectious Diseases Cell: [phone] Pager: [phone]  03/06/2017, 10:56 AM

## 2017-03-06 NOTE — Care Management Note (Signed)
Case Management Note  Patient Details  Name: KETIH GOODIE MRN: [id number] Date of Birth: [birth year]  Subjective/Objective:                  Dyspnea requiring hfnc at 7l/min  Action/Plan: Date:  March 06, 2017 Chart reviewed for concurrent status and case management needs.  Will continue to follow patient progress.  Discharge Planning: following for needs.  None present at this time of review. Expected discharge date: March 09, 2017 Velva Harman, BSN, [place], [place]   Expected Discharge Date:  03/08/17               Expected Discharge Plan:  Home/Self Care  In-House Referral:     Discharge planning Services  CM Consult  Post Acute Care Choice:    Choice offered to:     DME Arranged:    DME Agency:     HH Arranged:    HH Agency:     Status of Service:  In process, will continue to follow  If discussed at Long Length of Stay Meetings, dates discussed:    Additional Comments:  Leeroy Cha, RN 03/06/2017, 9:56 AM

## 2017-03-06 NOTE — Procedures (Signed)
Pt arrived from [facility] via care link at this time with #7.5 ETT in place secured 25@lip .  Placed pt on vent, settings per MD order. Pt tolerating well. No follow up ABG post intubation has been done, will do at this time.  RT will monitor.

## 2017-03-06 NOTE — Progress Notes (Signed)
PROGRESS NOTE    Gregory Galvan  RCV:[id number] DOB: [birth year] DOA: 03/05/2017 PCP: Unk Pinto, MD Brief Narrative: 77 year old male with a history of AL amyloidosis admitted with bilateral pneumonia.  He was treated as an outpatient with doxycycline which did not improve his symptoms.  He was seen by his oncologist  on the day of admission and was told to come to the ER.  In addition to that he has a history of hypertension hyperlipidemia BPH and hypothyroidism.  Patient was initially treated with BiPAP and was titrated to nasal cannula.  But overnight again patient desaturated and was placed on a nonrebreather mask. 1/20 patient on nonrebreather mask satting at 90%.  CT scan of the chest done which shows1. The appearance of the lungs is most compatible with severe multilobar pneumonia. 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 2. Aortic atherosclerosis, in addition to 3 vessel coronary artery disease. Assessment for potential risk factor modification, dietary therapy or pharmacologic therapy may be warranted, if clinically indicated. 3. Small hiatal hernia.  1/21-patient fell from bed last night and was confused.  CT head x-ray of the shoulders negative.  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  He was moved to the ICU last last evening for BiPAP.  But patient was more agitated and confused and would not keep the BiPAP on.  Appreciate P CCM and Dr. Crissie Figures input.  At this time he is on cefepime and vancomycin.  Solu-Medrol 60 mg IV every 6 was added yesterday.  We are not exactly sure sure what is causing  his respiratory failure at this time.  He is at risk for opportunistic infections with a history of amyloidosis and  and has received chemotherapy as well as high-dose steroids.  There is also concern about whether this is due to a mild load of the lung patient started on Diflucan.  Or nocardia.  But at this time he is not in that shape to get a lung biopsy due to increased risk of being vent dependent.  Positive cryptococcal antigen titer 160.  Follow-up ID recommendations and P CCM recommendations.   2] type 2 diabetes diet-controlled  3] amyloidosis of the kidney diagnosed by kidney biopsy.  4] hypothyroidism patient on Synthroid  5] thrombocytopenia platelets have been dropping Lovenox has been stopped yesterday.   DVT prophylaxis: SCD Code Status full code Family Communication: Discussed with nephew Disposition Plan: TBD Consultants:   P CCM, ID  Procedures: None Antimicrobials cefepime, clindamycin, Diflucan  Subjective: Patient appears tachypneic tachycardic in respiratory distress.   Objective: Vitals:   03/06/17 0754 03/06/17 0800 03/06/17 0810 03/06/17 0900  BP:  (!) 164/109 (!) 146/96 119/76  Pulse:  (!) 121 (!) 103 93  Resp:  (!) 30 (!) 21 (!) 22  Temp:      TempSrc:      SpO2: 100% (!) 81% 91% 97%  Weight:      Height:        Intake/Output Summary (Last 24 hours) at 03/06/2017 1053 Last data filed at 03/06/2017 0622 Gross per 24 hour  Intake 1010 ml  Output 1100 ml  Net -90 ml   Filed Weights   03/05/17 0551 03/05/17  1450 03/06/17 0632  Weight: 81.9 kg (180 lb 8.9 oz) 84 kg (185 lb 3 oz) 78.4 kg (172 lb 13.5 oz)    Examination:  General exam: Appears calm and comfortable  Respiratory system: Course  to auscultation. Respiratory effort normal. Cardiovascular system: S1 & S2 heard, RRR. No JVD, murmurs, rubs, gallops or clicks. No pedal edema. Gastrointestinal system: Abdomen is nondistended, soft and nontender. No organomegaly or masses felt. Normal  bowel sounds heard. Central nervous system: Alert and oriented. No focal neurological deficits. Extremities: Symmetric 5 x 5 power. Skin: No rashes, lesions or ulcers Psychiatry: Judgement and insight appear normal. Mood & affect appropriate.     Data Reviewed: I have personally reviewed following labs and imaging studies  CBC: Recent Labs  Lab 02/28/17 1445 03/01/17 1325 02/17/2017 1833 03/04/17 0043 03/04/17 0500 03/05/17 0620  WBC 11.0* 7.1 11.8* 8.9 9.0 9.7  NEUTROABS 9,999* 6.2 11.3*  --   --   --   HGB 13.3  --  13.5 11.6* 11.7* 11.1*  HCT 37.6* 34.4* 38.7* 34.4* 34.0* 33.5*  MCV 93.3 95.0 94.9 94.8 93.9 96.3  PLT 166 150 129* 98* 84* 72*   Basic Metabolic Panel: Recent Labs  Lab 02/28/17 1445 03/01/17 1325 03/04/2017 1833 03/04/17 0043 03/04/17 0500 03/05/17 0620  NA 131* 132* 133*  --  134* 135  K 4.3 4.0 4.7  --  4.4 3.9  CL 96* 102 101  --  107 107  CO2 26 22 22   --  22 24  GLUCOSE 104* 94 140*  --  152* 89  BUN 20 17 24*  --  21* 21*  CREATININE 0.90  --  1.07 0.94 0.90 0.73  CALCIUM 8.2* 8.1* 9.0  --  8.0* 8.1*   GFR: Estimated Creatinine Clearance: 78.4 mL/min (by C-G formula based on SCr of 0.73 mg/dL). Liver Function Tests: Recent Labs  Lab 02/28/17 1445 03/01/17 1325 02/20/2017 1833  AST 42* 48* 71*  ALT 27 29 42  ALKPHOS  --  88 114  BILITOT 0.6 0.4 <0.1*  PROT 4.7* 4.9* 6.1*  ALBUMIN  --  2.0* 2.6*   No results for input(s): LIPASE, AMYLASE in the last 168 hours. No results for input(s): AMMONIA in the last 168 hours. Coagulation Profile: Recent Labs  Lab 02/17/2017 1833  INR 1.08   Cardiac Enzymes: No results for input(s): CKTOTAL, CKMB, CKMBINDEX, TROPONINI in the last 168 hours. BNP (last 3 results) No results for input(s): PROBNP in the last 8760 hours. HbA1C: No results for input(s): HGBA1C in the last 72 hours. CBG: Recent Labs  Lab 03/05/17 0724 03/05/17 1213 03/05/17 1857 03/05/17 2152 03/06/17 0750  GLUCAP 82 100*  144* 144* 120*   Lipid Profile: No results for input(s): CHOL, HDL, LDLCALC, TRIG, CHOLHDL, LDLDIRECT in the last 72 hours. Thyroid Function Tests: No results for input(s): TSH, T4TOTAL, FREET4, T3FREE, THYROIDAB in the last 72 hours. Anemia Panel: No results for input(s): VITAMINB12, FOLATE, FERRITIN, TIBC, IRON, RETICCTPCT in the last 72 hours. Sepsis Labs: Recent Labs  Lab 02/19/2017 1854 03/09/2017 2048 03/05/17 1514  PROCALCITON  --   --  0.18  LATICACIDVEN 3.39* 1.48  --     Recent Results (from the past 240 hour(s))  Culture, blood (Routine x 2)     Status: None (Preliminary result)   Collection Time: 03/07/2017  6:38 PM  Result Value Ref Range Status   Specimen Description BLOOD RIGHT ANTECUBITAL  Final   Special Requests   Final  BOTTLES DRAWN AEROBIC AND ANAEROBIC Blood Culture adequate volume   Culture   Final    NO GROWTH 2 DAYS Performed at [facility], [place] [address], [place], [place] [address]    Report Status PENDING  Incomplete  Culture, blood (Routine x 2)     Status: None (Preliminary result)   Collection Time: 03/12/2017  6:38 PM  Result Value Ref Range Status   Specimen Description BLOOD RIGHT FOREARM  Final   Special Requests   Final    BOTTLES DRAWN AEROBIC AND ANAEROBIC Blood Culture adequate volume   Culture   Final    NO GROWTH 2 DAYS Performed at [facility], [place] [address], [place], [place] [address]    Report Status PENDING  Incomplete  MRSA PCR Screening     Status: None   Collection Time: 03/04/17  6:13 PM  Result Value Ref Range Status   MRSA by PCR NEGATIVE NEGATIVE Final    Comment:        The GeneXpert MRSA Assay (FDA approved for NASAL specimens only), is one component of a comprehensive MRSA colonization surveillance program. It is not intended to diagnose MRSA infection nor to guide or monitor treatment for MRSA infections.   Pneumocystis smear by DFA     Status: None   Collection Time: 03/05/17 12:47 PM  Result  Value Ref Range Status   Specimen Source-PJSRC EXPECTORATED SPUTUM  Final   Pneumocystis jiroveci Ag NEGATIVE  Final    Comment: Performed at [facility]  Respiratory Panel by PCR     Status: None   Collection Time: 03/05/17  2:10 PM  Result Value Ref Range Status   Adenovirus NOT DETECTED NOT DETECTED Final   Coronavirus 229E NOT DETECTED NOT DETECTED Final   Coronavirus HKU1 NOT DETECTED NOT DETECTED Final   Coronavirus NL63 NOT DETECTED NOT DETECTED Final   Coronavirus OC43 NOT DETECTED NOT DETECTED Final   Metapneumovirus NOT DETECTED NOT DETECTED Final   Rhinovirus / Enterovirus NOT DETECTED NOT DETECTED Final   Influenza A NOT DETECTED NOT DETECTED Final   Influenza B NOT DETECTED NOT DETECTED Final   Parainfluenza Virus 1 NOT DETECTED NOT DETECTED Final   Parainfluenza Virus 2 NOT DETECTED NOT DETECTED Final   Parainfluenza Virus 3 NOT DETECTED NOT DETECTED Final   Parainfluenza Virus 4 NOT DETECTED NOT DETECTED Final   Respiratory Syncytial Virus NOT DETECTED NOT DETECTED Final   Bordetella pertussis NOT DETECTED NOT DETECTED Final   Chlamydophila pneumoniae NOT DETECTED NOT DETECTED Final   Mycoplasma pneumoniae NOT DETECTED NOT DETECTED Final         Radiology Studies: Dg Chest 1 View  Result Date: 03/05/2017 CLINICAL DATA:  77 year old male with increased shortness of breath. Hypoxia. EXAM: CHEST 1 VIEW COMPARISON:  Chest x-ray 02/21/2017. FINDINGS: Worsening widespread but asymmetrically distributed interstitial and airspace disease throughout the lungs bilaterally, most confluent in the right mid lung, compatible with progressive multilobar bronchopneumonia. Probable small right pleural effusion. No left pleural effusion. Heart size appears normal. Upper mediastinal contours are within normal limits. IMPRESSION: 1. Progressively worsening multilobar bronchopneumonia, as above. 2. Probable small right pleural effusion. Electronically Signed   By: Vinnie Langton M.D.   On: 03/05/2017 07:59   Ct Head Wo Contrast  Result Date: 03/05/2017 CLINICAL DATA:  Fall EXAM: CT HEAD WITHOUT CONTRAST TECHNIQUE: Contiguous axial images were obtained from the base of the skull through the vertex without intravenous contrast. COMPARISON:  None. FINDINGS: Brain: No mass lesion, intraparenchymal hemorrhage or extra-axial collection. No evidence of acute cortical infarct. There is periventricular hypoattenuation compatible with chronic microvascular disease. Old bilateral basal ganglia lacunar infarcts. Vascular: No hyperdense vessel or unexpected calcification. Skull: Normal visualized skull base, calvarium and extracranial soft tissues. Sinuses/Orbits: No sinus fluid levels or advanced mucosal thickening. No mastoid effusion. Normal orbits. 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 Electronically Signed   By: Ulyses Jarred M.D.   On: 03/05/2017 22:35   Dg Shoulder Left Port  Result Date: 03/05/2017 CLINICAL DATA:  Pain after fall. EXAM: LEFT SHOULDER - 1 VIEW COMPARISON:  August 01, 2011. FINDINGS: Degenerative changes are seen at the [facility] joint. The humeral head is high riding consistent with a chronic rotator cuff tear. No acute fractures are identified. No dislocation. IMPRESSION: No fracture or dislocation. High riding humeral head consistent with a chronic rotator cuff tear. Electronically Signed   By: Dorise Bullion III M.D   On: 03/05/2017 23:04        Scheduled Meds: . acyclovir  400 mg Oral Daily  . ezetimibe  10 mg Oral Daily  . famotidine  10 mg Oral BID  . guaiFENesin  600 mg Oral BID  . insulin aspart  0-9 Units Subcutaneous TID WC  . ipratropium-albuterol  3 mL Nebulization TID  . latanoprost  1 drop Both Eyes QHS  . levothyroxine  175 mcg Oral QAC breakfast  . loratadine  10 mg Oral Daily  . mouth rinse  15 mL Mouth Rinse BID  . methylPREDNISolone (SOLU-MEDROL)  injection  60 mg Intravenous Q6H  . PARoxetine  10 mg Oral Daily  . sodium chloride flush  3 mL Intravenous Q12H   Continuous Infusions: . ceFEPime (MAXIPIME) IV Stopped (03/06/17 0622)  . dexmedetomidine (PRECEDEX) IV infusion    . vancomycin Stopped (03/05/17 1945)     LOS: 3 days     Georgette Shell, MD [facility] If 7PM-7AM, please contact night-coverage [email or web address] Password [facility] 03/06/2017, 10:53 AM

## 2017-03-06 NOTE — Progress Notes (Signed)
eLink Physician-Brief Progress Note Patient Name: Gregory Galvan DOB: [birth year] MRN: [id number]   Date of Service  03/06/2017  HPI/Events of Note  Oliguria - Bladder scan with 1 liter residual. I/O cathed --> 2 liters of urine obtained.   eICU Interventions  Will order: 1. Will place Foley catheter.      Intervention Category Intermediate Interventions: Oliguria - evaluation and management  Sommer,Steven Eugene 03/06/2017, 11:37 PM

## 2017-03-06 NOTE — Progress Notes (Signed)
Family updated at beside by Dr. Elsworth Soho on CT head findings.

## 2017-03-06 NOTE — Progress Notes (Signed)
[facility] Progress Note Patient Name: Gregory Galvan DOB: [birth year] MRN: [id number]   Date of Service  03/06/2017  HPI/Events of Note  Bedside nurse requests Abd film to confirm OGT placement.  eICU Interventions  Will order portable Abdominal film 1v.     Intervention Category Major Interventions: Other:  Lysle Dingwall 03/06/2017, 8:35 PM

## 2017-03-06 NOTE — Procedures (Signed)
Bronchoscopy Procedure Note Gregory Galvan [id number] Jul 10, 1940  Procedure: Bronchoscopy Indications: Obtain specimens for culture and/or other diagnostic studies  Procedure Details Consent: Risks of procedure as well as the alternatives and risks of each were explained to the (patient/caregiver).  Consent for procedure obtained. Time Out: Verified patient identification, verified procedure, site/side was marked, verified correct patient position, special equipment/implants available, medications/allergies/relevent history reviewed, required imaging and test results available.  Performed  In preparation for procedure, patient was given 100% FiO2, bronchoscope lubricated and inhaled beta agonist administered. Sedation: Etomidate versed/ fent  Airway entered and the following bronchi were examined: Bronchi.   Procedures performed: BAL performed Bronchoscope removed.  , Patient placed back on 100% FiO2 at conclusion of procedure.    Evaluation Hemodynamic Status: BP stable throughout; O2 sats: stable throughout Patient's Current Condition: stable Specimens:  Sent serosanguinous fluid Complications: No apparent complications Patient did tolerate procedure well.   Gregory Galvan Gregory Galvan 03/06/2017

## 2017-03-06 NOTE — Progress Notes (Signed)
[facility] Pulmonary Diseases & Critical Care Medicine Initial Pulmonary/Critical Care Consultation  Patient Name: Gregory Galvan MRN: [id number] DOB: [birth year]    ADMISSION DATE:  03/12/2017 CONSULTATION DATE:  03/05/2017  REFERRING MD:  Dr. Landis Gandy  REASON FOR CONSULTATION:  hypoxia   HISTORY OF PRESENT ILLNESS  This 77 y.o. Caucasian male is seen in consultation at the request of Dr. Landis Gandy for recommendations on further evaluation and management of acute hypoxic respiratory failure. The patient was admitted after failed outpatient treatment for "pneumonia". He has BL infx & rapidly increased in supplemental oxygen requirement and is now needing 7 LPM. He has no baseline supplemental oxygen requirement. 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  on velcade x 2 cycles  ANTIBIOTICS: Cefepime clindamycin  SIGNIFICANT EVENTS: 1/20 >> transferred to stepdown unit for increasing O2 requirement 1/21 confused -fall overnight - head CT, XR shoulder neg  LINES/TUBES: -   SUBJ Afebrile Remains on high flow O2 COnfused, fall overnight, sedated with ativan   VITAL SIGNS: BP 119/76   Pulse 93   Temp (!) 97.3 F (36.3 C) (Axillary)   Resp (!) 22   Ht _0  (1.727 m)   Wt 172 lb 13.5 oz (78.4 kg)   SpO2 97%   BMI 26.28 kg/m   HEMODYNAMICS:    VENTILATOR SETTINGS:    INTAKE / OUTPUT: I/O last 3 completed shifts: In: 1640 [P.O.:390; IV Piggyback:1250] Out: 1100 [Urine:1100]  PHYSICAL EXAMINATION: General: sedated Well-developed. mild acute distress Head: normocephalic, atraumatic EYE: PERRLA, EOM intact, no scleral icterus, no pallor Nose: nares are patent. No polyps. No exudate. No sinus tenderness. Throat/Oral Cavity: Normal dentition. No oral thrush. No exudate. Mucous membranes are moist. No tonsillar enlargement. Neck: supple, no thyromegaly, no JVD, no lymphadenopathy. Trachea midline. Chest/Lung: symmetric in  development and expansion. Good air entry. Diffuse bilateral crackles. BL scattered insp  rhonchi  Heart: Regular S1 and S2 without murmur, rub or gallop. Abdomen: soft, nontender, nondistended. Normoactive bowel sounds. n rebound. No guarding. Extremities: no pedal edema, no cyanosis, no clubbing. 2+ DP pulses Lymphatic: no cervical/axiallary/inguinal lymph nodes appreciated Skin:  No rash or lesion. NEURO: sedsted, grossly non focal   LABS:  BMET Recent Labs  Lab 03/02/2017 1833 03/04/17 0043 03/04/17 0500 03/05/17 0620  NA 133*  --  134* 135  K 4.7  --  4.4 3.9  CL 101  --  107 107  CO2 22  --  22 24  BUN 24*  --  21* 21*  CREATININE 1.07 0.94 0.90 0.73  GLUCOSE 140*  --  152* 89    Electrolytes Recent Labs  Lab 03/16/2017 1833 03/04/17 0500 03/05/17 0620  CALCIUM 9.0 8.0* 8.1*    CBC Recent Labs  Lab 03/04/17 0043 03/04/17 0500 03/05/17 0620  WBC 8.9 9.0 9.7  HGB 11.6* 11.7* 11.1*  HCT 34.4* 34.0* 33.5*  PLT 98* 84* 72*    Coag's Recent Labs  Lab 03/16/2017 1833  INR 1.08    Sepsis Markers Recent Labs  Lab 02/14/2017 1854 03/15/2017 2048 03/05/17 1514  LATICACIDVEN 3.39* 1.48  --   PROCALCITON  --   --  0.18    ABG Recent Labs  Lab 03/05/17 0840 03/06/17 0334  PHART 7.465* 7.463*  PCO2ART 31.2* 32.2  PO2ART 68.4* 64.6*    Liver Enzymes Recent Labs  Lab 02/28/17 1445 03/01/17 1325 03/02/2017 1833  AST 42* 48* 71*  ALT 27 29 42  ALKPHOS  --  88 114  BILITOT 0.6 0.4 <0.1*  ALBUMIN  --  2.0* 2.6*    Cardiac Enzymes No results for input(s): TROPONINI, PROBNP in the last 168 hours.  Glucose Recent Labs  Lab 03/04/17 2106 03/05/17 0724 03/05/17 1213 03/05/17 1857 03/05/17 2152 03/06/17 0750  GLUCAP 105* 82 100* 144* 144* 120*    Imaging Ct Head Wo Contrast  Result Date: 03/05/2017 CLINICAL DATA:  Fall EXAM: CT HEAD WITHOUT CONTRAST TECHNIQUE: Contiguous axial images were obtained from the base of the skull through the vertex  without intravenous contrast. COMPARISON:  None. FINDINGS: Brain: No mass lesion, intraparenchymal hemorrhage or extra-axial collection. No evidence of acute cortical infarct. There is periventricular hypoattenuation compatible with chronic microvascular disease. Old bilateral basal ganglia lacunar infarcts. Vascular: No hyperdense vessel or unexpected calcification. Skull: Normal visualized skull base, calvarium and extracranial soft tissues. Sinuses/Orbits: No sinus fluid levels or advanced mucosal thickening. No mastoid effusion. Normal orbits. 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 Electronically Signed   By: Ulyses Jarred M.D.   On: 03/05/2017 22:35   Dg Shoulder Left Port  Result Date: 03/05/2017 CLINICAL DATA:  Pain after fall. EXAM: LEFT SHOULDER - 1 VIEW COMPARISON:  August 01, 2011. FINDINGS: Degenerative changes are seen at the [facility] joint. The humeral head is high riding consistent with a chronic rotator cuff tear. No acute fractures are identified. No dislocation. IMPRESSION: No fracture or dislocation. High riding humeral head consistent with a chronic rotator cuff tear. 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 Of course, he has a history of Velcade therapy & pulmonary infx could be related to drug toxicity ? Alternatively, pulmonary involvement of amyloidosis should also be considered. However, with a rising oxygen requirement, lung biopsy at this time may come with excessive risk of ending up on mechanical ventilatory support.Bronchoscopy with BAL may carry a more acceptable risk, but again, I  question whether he is really at significantly increased risk of opportunistic infection to justify proceeding with even BAL. Also will need intubation for this  - ct high flow O2 -ct empiric ABs, pct less reliable in this setting - solumedrol 60 q 6h -Not clear how to interpret pos crypto Ag in this setting, will defer to ID, may have to treat since no alternative diagnosis   Nephrotic syndrome/ Renal amyloid - hold lisinopril in acute setting  Acute encephalopathy - likely delerium due to hypoxia  - dc ativan -use precedex gtt low dose if needed    FAMILY  - Updates: nephew at bedside, await arrival of his partner ben/ HCPOA for further discussion re: goals of care  - Inter-disciplinary family meet or Palliative Care meeting due by:  day 7  The patient is critically ill with multiple organ systems failure and requires high complexity decision making for assessment and support, frequent evaluation and titration of therapies, application of advanced monitoring technologies and extensive interpretation of multiple databases. Critical Care Time devoted to patient care services described in this note independent of APP/resident  time is 32 minutes.   Kara Mead MD. Shade Flood. [place] [facility] Pager [phone] If no  response call [phone]      03/06/2017, 9:37 AM

## 2017-03-06 NOTE — Progress Notes (Addendum)
During reassessment, pt's left pupil is larger than right. Both pupils are round and respond to light. Patient left eye sclera is also red, appears to be blood. Patient still unable to follow commands, but spontaneously moving all four extremities. Dr. Rodena Piety ([facility]) notified of pupil changes and mental status. Order written for stat CT of head. Family at bedside and updated on changes & new orders. Will continue to monitor.

## 2017-03-07 ENCOUNTER — Encounter (HOSPITAL_COMMUNITY): Payer: Self-pay

## 2017-03-07 ENCOUNTER — Inpatient Hospital Stay (HOSPITAL_COMMUNITY): Payer: Medicare Other

## 2017-03-07 DIAGNOSIS — H5789 Other specified disorders of eye and adnexa: Secondary | ICD-10-CM

## 2017-03-07 DIAGNOSIS — Z882 Allergy status to sulfonamides status: Secondary | ICD-10-CM

## 2017-03-07 DIAGNOSIS — J158 Pneumonia due to other specified bacteria: Secondary | ICD-10-CM

## 2017-03-07 DIAGNOSIS — Z9911 Dependence on respirator [ventilator] status: Secondary | ICD-10-CM

## 2017-03-07 DIAGNOSIS — Z880 Allergy status to penicillin: Secondary | ICD-10-CM

## 2017-03-07 DIAGNOSIS — Z887 Allergy status to serum and vaccine status: Secondary | ICD-10-CM

## 2017-03-07 DIAGNOSIS — R000 Tachycardia, unspecified: Secondary | ICD-10-CM

## 2017-03-07 DIAGNOSIS — G9340 Encephalopathy, unspecified: Secondary | ICD-10-CM

## 2017-03-07 DIAGNOSIS — B459 Cryptococcosis, unspecified: Secondary | ICD-10-CM

## 2017-03-07 DIAGNOSIS — Z87891 Personal history of nicotine dependence: Secondary | ICD-10-CM

## 2017-03-07 DIAGNOSIS — I639 Cerebral infarction, unspecified: Secondary | ICD-10-CM

## 2017-03-07 LAB — GLUCOSE, CAPILLARY
GLUCOSE-CAPILLARY: 133 mg/dL — AB (ref 65–99)
Glucose-Capillary: 136 mg/dL — ABNORMAL HIGH (ref 65–99)
Glucose-Capillary: 117 mg/dL — ABNORMAL HIGH (ref 65–99)
Glucose-Capillary: 136 mg/dL — ABNORMAL HIGH (ref 65–99)

## 2017-03-07 LAB — LIPID PANEL
CHOLESTEROL: 154 mg/dL (ref 0–200)
HDL: 58 mg/dL (ref >40)
LDL Cholesterol: 70 mg/dL (ref 0–99)
TRIGLYCERIDES: 128 mg/dL (ref <150)
Total CHOL/HDL Ratio: 2.7 RATIO
VLDL: 26 mg/dL (ref 0–40)

## 2017-03-07 LAB — IGG, IGA, IGM
IGA: 51 mg/dL — AB (ref 61–437)
IGG (IMMUNOGLOBIN G), SERUM: 368 mg/dL — AB (ref 700–1600)
IgM (Immunoglobulin M), Srm: 41 mg/dL (ref 15–143)

## 2017-03-07 LAB — PROCALCITONIN: Procalcitonin: 0.18 ng/mL

## 2017-03-07 LAB — PNEUMOCYSTIS JIROVECI SMEAR BY DFA: Pneumocystis jiroveci Ag: NEGATIVE

## 2017-03-07 LAB — VANCOMYCIN, TROUGH: Vancomycin Tr: 11 ug/mL — ABNORMAL LOW (ref 15–20)

## 2017-03-07 LAB — LEGIONELLA PNEUMOPHILA SEROGP 1 UR AG: L. pneumophila Serogp 1 Ur Ag: NEGATIVE

## 2017-03-07 LAB — HEMOGLOBIN A1C
Hgb A1c MFr Bld: 5.9 % — ABNORMAL HIGH (ref 4.8–5.6)
Mean Plasma Glucose: 122.63 mg/dL

## 2017-03-07 LAB — LACTIC ACID, PLASMA: Lactic Acid, Venous: 1.4 mmol/L (ref 0.5–1.9)

## 2017-03-07 MED ORDER — SODIUM CHLORIDE 0.9 % IV BOLUS (SEPSIS)
500.0000 mL | Freq: Once | INTRAVENOUS | Status: AC
  Administered 2017-03-07: 500 mL via INTRAVENOUS

## 2017-03-07 MED ORDER — VANCOMYCIN HCL IN DEXTROSE 1-5 GM/200ML-% IV SOLN
1000.0000 mg | Freq: Two times a day (BID) | INTRAVENOUS | Status: DC
  Administered 2017-03-08 – 2017-03-10 (×4): 1000 mg via INTRAVENOUS
  Filled 2017-03-07 (×5): qty 200

## 2017-03-07 MED ORDER — FENTANYL 2500MCG IN NS 250ML (10MCG/ML) PREMIX INFUSION
25.0000 ug/h | INTRAVENOUS | Status: DC
  Administered 2017-03-07: 100 ug/h via INTRAVENOUS
  Filled 2017-03-07: qty 250

## 2017-03-07 MED ORDER — FENTANYL CITRATE (PF) 100 MCG/2ML IJ SOLN
50.0000 ug | INTRAMUSCULAR | Status: DC | PRN
  Administered 2017-03-07 – 2017-03-10 (×4): 50 ug via INTRAVENOUS
  Filled 2017-03-07 (×4): qty 2

## 2017-03-07 MED ORDER — LACTATED RINGERS IV SOLN
INTRAVENOUS | Status: DC
  Administered 2017-03-07 – 2017-03-09 (×4): via INTRAVENOUS

## 2017-03-07 MED ORDER — GADOBENATE DIMEGLUMINE 529 MG/ML IV SOLN
15.0000 mL | Freq: Once | INTRAVENOUS | Status: AC | PRN
  Administered 2017-03-07: 15 mL via INTRAVENOUS

## 2017-03-07 MED ORDER — GUAIFENESIN 100 MG/5ML PO SOLN
15.0000 mL | Freq: Four times a day (QID) | ORAL | Status: DC
  Administered 2017-03-08 – 2017-03-10 (×6): 300 mg
  Filled 2017-03-07 (×7): qty 15

## 2017-03-07 NOTE — Progress Notes (Signed)
Fentanyl expired at 03/07/17 @ 0330 according to written pharmacy label.  Emptied bag and wasted 225 ml with Chesley Noon, RN into sink.

## 2017-03-07 NOTE — Progress Notes (Signed)
SLP Cancellation Note  Patient Details Name: Gregory Galvan MRN: [id number] DOB: 1940-10-11   Cancelled treatment:       Reason Eval/Treat Not Completed: Patient not medically ready   Navie Lamoreaux, Katherene Ponto 03/07/2017, 7:57 AM

## 2017-03-07 NOTE — Plan of Care (Signed)
Care plan reviewed.

## 2017-03-07 NOTE — Consult Note (Signed)
[place] for Infectious Disease    Date of Admission:  02/22/2017     Total days of antibiotics 5  Day 5 Vancomycin  Day 5 Cefepime  Day 3 Acyclovir prophylaxis  Day 2 Fluconazole                Reason for Consult: Positive Cryptococcal antigen, pneumonia   Referring Provider: PCCM   Assessment: 77 y.o. immunocompromised male with respiratory failure, altered mental status, MRI with multiple small foci of acute ischemic infarction with both cerebellar hemispheres. During work up found to have positive serum cryptococcal antigen titer. It is certainly possible he has cryptococcal pneumonia being he has been immunosuppressed on corticosteroids; however we should also consider meningitis involvement considering his altered mental status changes. Could be that this is a disseminated process involving both of these sites.   Plan: 1. Ideally would like to check a lumbar puncture to further evaluate the utility of his positive serum cryptococcal antigen titer considering he has also had altered/deteriorating mental status. Not only would we be able to check Cryptococcal Ag on CSF to validate meningitis the procedure would also be therapeutic to reduce intracranial pressure if this were indeed the case.  2. For now would continue Fluconazole 400 mg QD to treat suspected cryptococcal pneumonia.  3. PJP stains negative x 2 from expectorated sample and BAL sample. Fungal cultures are pending for now. Pulmonary status currently stable and remains on stable ventilator settings since intubation. Continue Vancomycin and Cefepime for now.    HPI: Gregory Galvan is a 77 y.o. male admitted with worsening shortness of breath in the setting of pneumonia. He has a history of renal AL amyloidosis (renal bx on 09/2016, bone marrow asp negative) who has been on treatment with velcade (bortezomid) and dexamethasone.   Initially was started on 7d course of Doxycyline by his oncologist after cxr  revealed pneumonia. At that time he was having fevers of 100.7 after having previously struggled with congestion for several weeks. Initially he did have some improvement on Doxycycline however his family urged him to call back to speak with his oncologist the day after he completed this antibiotic due to more severe shortness of breath on 1/18. It was recommended he report to the ED for further evaluation.   In the Emergency Department at [facility] he was found to be normotensive, slightly tachycardic, tachypneic and slightly hypoxic with sat 87 - 90% on 4LPM. No recorded temperatures in ED. He was started on Vancomycin and Cefepime sepsis related to CAP. He was seen by Dr. Baxter Flattery while he was over at [facility]. He continued to worsen with increased hypoxia over the next 48 hours after starting empiric tx for HCAP and required non-rebreather mask. Potential concern for PJP vs nocardia involvement vs interstitial involvement from his the velcade. He was started on high dose steroids with recommendations to add bactrim if he continued to worsen.   Two nights ago he became confused and fell out of his bed to which he became progressively more confused. It was unclear if he hit his head. At the time Dr. Baxter Flattery evaluated him he was not alert. Of note his serum cryptococcal Ag was positive 1:160. He was started on Fluconazole 400 mg IV QD while awaiting further work up. 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   He is currently minimally responsive on ventilator only  opening his eyes to noxious stimuli. His niece is at his bedside and reports that he has had progressively worsening hearing loss in the last 1 month prior to falling ill with pneumonia but was otherwise very healthy outside his treatment for amyloidosis.   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  stroke: mapping our early stages of recovery book   Does not apply Once  . acyclovir  400 mg Oral Daily  . chlorhexidine gluconate (MEDLINE KIT)  15 mL Mouth Rinse BID  . famotidine  10 mg Oral BID  . guaiFENesin  600 mg Oral BID  . insulin aspart  0-9 Units Subcutaneous TID WC  . ipratropium-albuterol  3 mL Nebulization TID  . latanoprost  1 drop Both Eyes QHS  . levothyroxine  175 mcg Oral QAC breakfast  . mouth rinse  15 mL Mouth Rinse 10 times per day  . methylPREDNISolone (SOLU-MEDROL) injection  60 mg Intravenous Q6H  . PARoxetine  10 mg Oral Daily  . sodium chloride flush  3 mL Intravenous Q12H    Review of Systems: Review of Systems  Unable to perform ROS: Intubated    Past Medical History:  Diagnosis Date  . BPH (benign prostatic hyperplasia)   . Hyperlipidemia   . Hypertension   . Hypogonadism, male   . Pre-diabetes   . Prostatitis   . Thyroid disease     Social History   Tobacco Use  . Smoking status: Former Smoker    Last attempt to quit: 12/22/2013    Years since quitting: 3.2  . Smokeless tobacco: Never Used  Substance Use Topics  . Alcohol use: Yes    Alcohol/week: 16.8 oz    Types: 28 Standard drinks or equivalent per week    Comment: drinks wine  . Drug use: No    Family History  Problem Relation Age of Onset  . Cancer Mother        breast  . Alzheimer's disease Mother   . Hypertension Father   . Heart disease Father   . Lymphoma Father   . Hypertension Brother   . Heart disease Brother   . Heart disease Brother   . Hypertension Brother   . Diabetes Brother    Allergies  Allergen Reactions  . 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 Sulfonamide Derivatives Itching and Swelling  . Penicillins Swelling and Rash    Has patient had a PCN reaction causing immediate rash, facial/tongue/throat swelling, SOB or  lightheadedness with hypotension: YES Has patient had a PCN reaction causing severe rash involving mucus membranes or skin necrosis:YES (PENILE SWELLING AND RASH) Has patient had a PCN reaction that required hospitalization:YES Has patient had a PCN reaction occurring within the last 10 years: nO If all of the above answers are "NO", then may proceed with Cephalosporin use.     OBJECTIVE: Blood pressure 111/84, pulse 83, temperature 98.5 F (36.9 C), temperature source Axillary, resp. rate 11, height _0  (1.753 m), weight 175 lb 14.8 oz (79.8 kg), SpO2 99 %.  Physical Exam  Constitutional: Vital signs are normal.  HENT:  Head: Normocephalic.  Mouth/Throat: No oral lesions. Normal dentition. No dental caries.  ETT in place. Blood tinged saliva and sputum noted in tubing. Scattered petechial hemorrhages noted over scalp.   Eyes:  Left conjunctiva has a hemorrhage. No scleral icterus. Left eye exhibits normal extraocular motion. Right pupil is round and reactive. Left pupil is round and reactive.  Conjunctival hemorrhage noted. Exophthalmos noted bilaterally. Left eyelid opens with stimulation whereas right remains closed.   Cardiovascular: Normal rate, regular rhythm and normal heart sounds.  Pulmonary/Chest: Effort normal and breath sounds normal.  Abdominal: Soft. He exhibits no distension. There is no tenderness.  Lymphadenopathy:    He has no cervical adenopathy.    He has no axillary adenopathy.  Neurological:  Opens eyes to noxious stimuli and occasionally to name. Does not reliably follow commands during assessment.   Skin: Skin is warm and dry. No rash noted.  Psychiatric: Mood and affect normal.  Vitals reviewed.   Lab Results Lab Results  Component Value Date   WBC 14.6 (H) 03/06/2017   HGB 11.1 (L) 03/06/2017   HCT 33.2 (L) 03/06/2017   MCV 96.0 03/06/2017   PLT 46 (L) 03/06/2017    Lab Results  Component Value Date   CREATININE 0.84 03/06/2017   BUN 19  03/06/2017   NA 135 03/06/2017   K 4.2 03/06/2017   CL 106 03/06/2017   CO2 23 03/06/2017    Lab Results  Component Value Date   ALT 32 03/06/2017   AST 61 (H) 03/06/2017   ALKPHOS 125 03/06/2017   BILITOT 0.6 03/06/2017     Microbiology:  Recent Results (from the past 240 hour(s))  Culture, blood (Routine x 2)     Status: None (Preliminary result)   Collection Time: 03/07/2017  6:38 PM  Result Value Ref Range Status   Specimen Description BLOOD RIGHT ANTECUBITAL  Final   Special Requests   Final    BOTTLES DRAWN AEROBIC AND ANAEROBIC Blood Culture adequate volume   Culture   Final    NO GROWTH 3 DAYS Performed at [facility], [place] [address] [place], [place] [address]    Report Status PENDING  Incomplete  Culture, blood (Routine x 2)     Status: None (Preliminary result)   Collection Time: 03/13/2017  6:38 PM  Result Value Ref Range Status   Specimen Description BLOOD RIGHT FOREARM  Final   Special Requests   Final    BOTTLES DRAWN AEROBIC AND ANAEROBIC Blood Culture adequate volume   Culture   Final    NO GROWTH 3 DAYS Performed at [facility], [place] [address] [place], [place] [address]    Report Status PENDING  Incomplete  MRSA PCR Screening     Status: None   Collection Time: 03/04/17  6:13 PM  Result Value Ref Range Status   MRSA by PCR NEGATIVE NEGATIVE Final    Comment:        The GeneXpert MRSA Assay (FDA approved for NASAL specimens only), is one component of a comprehensive MRSA colonization surveillance program. It is not intended to diagnose MRSA infection nor to guide or monitor treatment for MRSA infections.   Pneumocystis smear by DFA     Status: None   Collection Time: 03/05/17 12:47 PM  Result Value Ref Range Status   Specimen Source-PJSRC EXPECTORATED SPUTUM  Final   Pneumocystis jiroveci Ag NEGATIVE  Final    Comment: Performed at [facility]  Respiratory Panel by PCR     Status: None   Collection Time: 03/05/17   2:10 PM  Result Value Ref Range Status   Adenovirus NOT DETECTED NOT DETECTED Final  Coronavirus 229E NOT DETECTED NOT DETECTED Final   Coronavirus HKU1 NOT DETECTED NOT DETECTED Final   Coronavirus NL63 NOT DETECTED NOT DETECTED Final   Coronavirus OC43 NOT DETECTED NOT DETECTED Final   Metapneumovirus NOT DETECTED NOT DETECTED Final   Rhinovirus / Enterovirus NOT DETECTED NOT DETECTED Final   Influenza A NOT DETECTED NOT DETECTED Final   Influenza B NOT DETECTED NOT DETECTED Final   Parainfluenza Virus 1 NOT DETECTED NOT DETECTED Final   Parainfluenza Virus 2 NOT DETECTED NOT DETECTED Final   Parainfluenza Virus 3 NOT DETECTED NOT DETECTED Final   Parainfluenza Virus 4 NOT DETECTED NOT DETECTED Final   Respiratory Syncytial Virus NOT DETECTED NOT DETECTED Final   Bordetella pertussis NOT DETECTED NOT DETECTED Final   Chlamydophila pneumoniae NOT DETECTED NOT DETECTED Final   Mycoplasma pneumoniae NOT DETECTED NOT DETECTED Final  Culture, bal-quantitative     Status: None (Preliminary result)   Collection Time: 03/06/17  3:48 PM  Result Value Ref Range Status   Specimen Description BRONCHIAL ALVEOLAR LAVAGE  Final   Special Requests NONE  Final   Gram Stain   Final    FEW WBC PRESENT,BOTH PMN AND MONONUCLEAR RARE GRAM POSITIVE COCCI IN CHAINS RARE GRAM NEGATIVE COCCOBACILLI    Culture PENDING  Incomplete   Report Status PENDING  Incomplete  Pneumocystis smear by DFA     Status: None   Collection Time: 03/06/17  3:48 PM  Result Value Ref Range Status   Specimen Source-PJSRC BRONCHIAL ALVEOLAR LAVAGE  Final   Pneumocystis jiroveci Ag NEGATIVE  Final    Comment: Performed at [place], MSN, NP-C [facility] Cell: [phone] Pager: [phone]  03/07/2017 10:26 AM

## 2017-03-07 NOTE — Progress Notes (Signed)
[facility] Pulmonary Diseases & Critical Care Medicine Initial Pulmonary/Critical Care Consultation  Patient Name: Gregory Galvan MRN: [id number] DOB: 13-Apr-1940    ADMISSION DATE:  03/15/2017 CONSULTATION DATE:  03/05/2017  REFERRING MD:  Dr. Landis Gandy  REASON FOR CONSULTATION:  hypoxia   HISTORY OF PRESENT ILLNESS  This 77 y.o. Caucasian male is seen in consultation at the request of Dr. Landis Gandy for recommendations on further evaluation and management of acute hypoxic respiratory failure. The patient was admitted after failed outpatient treatment for "pneumonia". He has BL infx & rapidly increased in supplemental oxygen requirement.  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  His mental status deteriorated yesterday and he developed anisocoria and a CT scan of the head was obtained which showed a frontal lesion.  Subsequently an MRI was performed which shows multiple defects in many vascular territories.  He has required intubation both for airway protection and oxygenation on 1/21.  A BAL was performed on 1/21.  There are no significant results back from the BAL yet.  He had a cryptococcus antigen earlier in his hospital course which was positive.  He had a HIV titer performed on 10/08/2019 was negative.  He is currently on no sedation and not at all interactive.  ANTIBIOTICS: Cefepime clindamycin  SIGNIFICANT EVENTS: 1/20 >> transferred to stepdown unit for increasing O2 requirement 1/21 confused -fall overnight - head CT, XR shoulder neg  LINES/TUBES: -   SUBJ Afebrile Remains on high flow O2 COnfused, fall overnight, sedated with ativan   VITAL SIGNS: BP 111/84   Pulse 83   Temp 98.5 F (36.9 C) (Axillary)   Resp 11   Ht _0  (1.753 m)   Wt 175 lb 14.8 oz (79.8 kg)   SpO2 99%   BMI 25.98 kg/m   HEMODYNAMICS:    VENTILATOR SETTINGS: Vent Mode: PSV;CPAP FiO2 (%):  [50 %-100 %] 50 % Set  Rate:  [18 bmp] 18 bmp Vt Set:  [550 mL] 550 mL PEEP:  [8 cmH20] 8 cmH20 Pressure Support:  [10 cmH20] 10 cmH20 Plateau Pressure:  [23 cmH20-29 cmH20] 23 cmH20  INTAKE / OUTPUT: I/O last 3 completed shifts: In: 446.7 [I.V.:96.7; IV Piggyback:350] Out: 7824 [Urine:3740]  PHYSICAL EXAMINATION: General: Orally intubated and not at all interactive despite being on no sedation.   EYE: There is very slight anisocoria with the left pupil being 3-1/2 mm in the right 2-1/2.  He has some conjunctival injections bilaterally Throat/Oral Cavity: Normal dentition. No oral thrush. No exudate. Mucous membranes are moist. No tonsillar enlargement. Neck: supple, no thyromegaly, no JVD, no lymphadenopathy. Trachea midline. Chest/Lung: symmetric in development and expansion. Good air entry. Diffuse bilateral crackles. BL scattered insp  rhonchi  Heart: Regular S1 and S2 without murmur, rub or gallop. Abdomen: soft, nontender, nondistended. Normoactive bowel sounds. n rebound. No guarding. Extremities: There are raised purpuric lesions on both forearms.   Lymphatic: no cervical/axiallary/inguinal lymph nodes appreciated Skin:  No rash or lesion. NEURO: There is no response to loud noise or voice, there is no response to sternal rub.  EOMs are roving and pupils are as described.  He is breathing spontaneously with the endotracheal tube in place   LABS:  BMET Recent Labs  Lab 03/04/17 0500 03/05/17 0620 03/06/17 1142  NA 134* 135 135  K 4.4 3.9 4.2  CL 107 107 106  CO2 _1 BUN 21* 21* 19  CREATININE 0.90 0.73 0.84  GLUCOSE  152* 89 134*    Electrolytes Recent Labs  Lab 03/04/17 0500 03/05/17 0620 03/06/17 1142  CALCIUM 8.0* 8.1* 8.2*    CBC Recent Labs  Lab 03/04/17 0500 03/05/17 0620 03/06/17 1142  WBC 9.0 9.7 14.6*  HGB 11.7* 11.1* 11.1*  HCT 34.0* 33.5* 33.2*  PLT 84* 72* 46*    Coag's Recent Labs  Lab 03/14/2017 1833  INR 1.08    Sepsis Markers Recent Labs  Lab  02/20/2017 1854 02/23/2017 2048 03/05/17 1514  LATICACIDVEN 3.39* 1.48  --   PROCALCITON  --   --  0.18    ABG Recent Labs  Lab 03/05/17 0840 03/06/17 0334 03/06/17 1751  PHART 7.465* 7.463* 7.371  PCO2ART 31.2* 32.2 39.8  PO2ART 68.4* 64.6* 202.0*    Liver Enzymes Recent Labs  Lab 03/01/17 1325 03/16/2017 1833 03/06/17 1142  AST 48* 71* 61*  ALT 29 42 32  ALKPHOS 88 114 125  BILITOT 0.4 <0.1* 0.6  ALBUMIN 2.0* 2.6* 2.1*    Cardiac Enzymes No results for input(s): TROPONINI, PROBNP in the last 168 hours.  Glucose Recent Labs  Lab 03/06/17 0750 03/06/17 1155 03/06/17 1640 03/06/17 1952 03/06/17 2320 03/07/17 0752  GLUCAP 120* 123* 143* 133* 149* 136*    Imaging Ct Head Wo Contrast  Result Date: 03/06/2017 CLINICAL DATA:  Patient fell last evening. Confusion and difficulty arousing. EXAM: CT HEAD WITHOUT CONTRAST TECHNIQUE: Contiguous axial images were obtained from the base of the skull through the vertex without intravenous contrast. COMPARISON:  03/05/2017 FINDINGS: Brain: Loss of gray-white matter distinction in the right posterior frontal/parietal lobes since yesterday consistent with acute MCA distribution nonhemorrhagic infarcts. Atrophy with chronic small vessel ischemia and chronic bilateral basal ganglial lacunar infarcts are redemonstrated. No intra-axial mass nor extra-axial fluid collections. Vascular: No hyperdense vessel or unexpected calcification. Skull: Normal visualized skull base, calvarium and extracranial soft tissues. Sinuses/Orbits: No acute finding.  Under pneumatized mastoids. 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 These results will be called to the ordering clinician or representative by the Radiologist Assistant, and communication documented in the PACS or zVision Dashboard.  Electronically Signed   By: Ashley Royalty M.D.   On: 03/06/2017 13:47   Mr Jodene Nam Neck W Wo Contrast  Result Date: 03/07/2017 CLINICAL DATA:  Altered mental status EXAM: MR HEAD WITHOUT CONTRAST MR CIRCLE OF WILLIS WITHOUT CONTRAST MRA OF THE NECK WITHOUT AND WITH CONTRAST TECHNIQUE: Multiplanar, multiecho pulse sequences of the brain, circle of willis and surrounding structures were obtained without intravenous contrast. Angiographic images of the neck were obtained using MRA technique without and with intravenous contrast. CONTRAST:  15 mL gadobenate dimeglumine (MULTIHANCE) injection COMPARISON:  Head CT 03/06/2017 FINDINGS: MRI HEAD FINDINGS Brain: The midline structures are normal. There is extensive diffusion restriction throughout both cerebral and cerebellar hemispheres, predominantly cortical and subcortical. The largest areas of abnormality are in the posterior right frontal lobe and right parietal lobe. The largest area on the left is in the anterior left frontal lobe. There is sparing of the anterior cerebral artery territory, but all other major vascular territories upper involved. Extensive hyperintense T2-weighted signal at the sites of diffusion abnormality. Minimal chronic microvascular disease. No mass lesion. No chronic microhemorrhage or cerebral amyloid angiopathy. No hydrocephalus, age advanced atrophy or lobar predominant volume loss. No dural abnormality or extra-axial collection. Skull and upper cervical spine: The visualized skull base, calvarium, upper cervical spine  and extracranial soft tissues are normal. Sinuses/Orbits: No fluid levels or advanced mucosal thickening. No mastoid effusion. Normal orbits. MRA HEAD FINDINGS Intracranial internal carotid arteries: Normal. Anterior cerebral arteries: Normal. Middle cerebral arteries: Diminished flow related enhancement within the distal right MCA M1 and M2 branches is probably artifactual. On the source images, the vessel caliber is normal.  Left MCA is normal. Posterior communicating arteries: Absent bilaterally. Posterior cerebral arteries: Normal. Basilar artery: Normal. Vertebral arteries: Left dominant. Normal. Superior cerebellar arteries: Normal. Anterior inferior cerebellar arteries: Normal. Posterior inferior cerebellar arteries: Normal. MRA NECK FINDINGS Aortic arch: Normal 3 vessel aortic branching pattern. The visualized subclavian arteries are normal. Right carotid system: Normal course and caliber without stenosis or evidence of dissection. Left carotid system: Normal course and caliber without stenosis or evidence of dissection. Vertebral arteries: Left dominant. Vertebral artery origins are normal. Vertebral arteries are normal in course and caliber to the vertebrobasilar confluence without stenosis or evidence of dissection. IMPRESSION: 1. Multiple large areas of acute ischemia within both cerebral hemispheres with multiple small foci of acute ischemic infarction within both cerebellar hemispheres. The largest areas of abnormality are in the MCA distributions, but both PCA distributions are also involved. There is sparing of the anterior cerebral artery territory. Given the involvement of multiple vascular territories and the lack of abnormal vascular findings, acute hypoxic ischemic injury is the most probable explanation. 2. Normal MRA of the head and neck. This suggests that showering emboli are an unlikely explanation, though echocardiography may be helpful to exclude an embolic source within the patient's heart. 3. No acute hemorrhage or mass effect. 4. No cerebral amyloid angiopathy. Minimal chronic microvascular disease for age. Electronically Signed   By: Ulyses Jarred M.D.   On: 03/07/2017 03:31   Mr Jeri Cos TK Contrast  Result Date: 03/07/2017 CLINICAL DATA:  Altered mental status EXAM: MR HEAD WITHOUT CONTRAST MR CIRCLE OF WILLIS WITHOUT CONTRAST MRA OF THE NECK WITHOUT AND WITH CONTRAST TECHNIQUE: Multiplanar, multiecho  pulse sequences of the brain, circle of willis and surrounding structures were obtained without intravenous contrast. Angiographic images of the neck were obtained using MRA technique without and with intravenous contrast. CONTRAST:  15 mL gadobenate dimeglumine (MULTIHANCE) injection COMPARISON:  Head CT 03/06/2017 FINDINGS: MRI HEAD FINDINGS Brain: The midline structures are normal. There is extensive diffusion restriction throughout both cerebral and cerebellar hemispheres, predominantly cortical and subcortical. The largest areas of abnormality are in the posterior right frontal lobe and right parietal lobe. The largest area on the left is in the anterior left frontal lobe. There is sparing of the anterior cerebral artery territory, but all other major vascular territories upper involved. Extensive hyperintense T2-weighted signal at the sites of diffusion abnormality. Minimal chronic microvascular disease. No mass lesion. No chronic microhemorrhage or cerebral amyloid angiopathy. No hydrocephalus, age advanced atrophy or lobar predominant volume loss. No dural abnormality or extra-axial collection. Skull and upper cervical spine: The visualized skull base, calvarium, upper cervical spine and extracranial soft tissues are normal. Sinuses/Orbits: No fluid levels or advanced mucosal thickening. No mastoid effusion. Normal orbits. MRA HEAD FINDINGS Intracranial internal carotid arteries: Normal. Anterior cerebral arteries: Normal. Middle cerebral arteries: Diminished flow related enhancement within the distal right MCA M1 and M2 branches is probably artifactual. On the source images, the vessel caliber is normal. Left MCA is normal. Posterior communicating arteries: Absent bilaterally. Posterior cerebral arteries: Normal. Basilar artery: Normal. Vertebral arteries: Left dominant. Normal. Superior cerebellar arteries: Normal. Anterior inferior cerebellar arteries: Normal. Posterior inferior  cerebellar arteries:  Normal. MRA NECK FINDINGS Aortic arch: Normal 3 vessel aortic branching pattern. The visualized subclavian arteries are normal. Right carotid system: Normal course and caliber without stenosis or evidence of dissection. Left carotid system: Normal course and caliber without stenosis or evidence of dissection. Vertebral arteries: Left dominant. Vertebral artery origins are normal. Vertebral arteries are normal in course and caliber to the vertebrobasilar confluence without stenosis or evidence of dissection. IMPRESSION: 1. Multiple large areas of acute ischemia within both cerebral hemispheres with multiple small foci of acute ischemic infarction within both cerebellar hemispheres. The largest areas of abnormality are in the MCA distributions, but both PCA distributions are also involved. There is sparing of the anterior cerebral artery territory. Given the involvement of multiple vascular territories and the lack of abnormal vascular findings, acute hypoxic ischemic injury is the most probable explanation. 2. Normal MRA of the head and neck. This suggests that showering emboli are an unlikely explanation, though echocardiography may be helpful to exclude an embolic source within the patient's heart. 3. No acute hemorrhage or mass effect. 4. No cerebral amyloid angiopathy. Minimal chronic microvascular disease for age. Electronically Signed   By: Ulyses Jarred M.D.   On: 03/07/2017 03:31   Portable Chest Xray  Result Date: 03/07/2017 CLINICAL DATA:  Acute respiratory failure. Pneumonia, sepsis, immunocompromised state, history of amyloidosis EXAM: PORTABLE CHEST 1 VIEW COMPARISON:  Portable chest x-ray of March 06, 2017 FINDINGS: The lungs are better inflated today. The endotracheal tube tip projects 4.2 cm above the carina. The esophagogastric tube tip in proximal port project below the inferior margin of the image. The heart is normal in size. The pulmonary vascularity is indistinct. Airspace opacities  persist bilaterally greatest on the right. There has been mild improvement in the appearance of the left lung since the previous study. There is no significant pleural effusion. IMPRESSION: Improved aeration of both lungs with decreased airspace opacities on the left which may reflect improving edema or pneumonia. Persistent diffuse airspace opacities on the right. The support tubes are in reasonable position. Electronically Signed   By: David  Martinique M.D.   On: 03/07/2017 07:49   Portable Chest X-ray  Result Date: 03/06/2017 CLINICAL DATA:  Check endotracheal tube placement EXAM: PORTABLE CHEST 1 VIEW COMPARISON:  03/05/2017 FINDINGS: Endotracheal tube is been placed in satisfactory position. Diffuse bilateral infiltrates are again identified and stable. No pneumothorax is seen. No acute bony abnormality is noted. IMPRESSION: Endotracheal tube in satisfactory position. The remainder of the exam is stable from the previous day. Electronically Signed   By: Inez Catalina M.D.   On: 03/06/2017 16:40   Dg Abd Portable 1v  Result Date: 03/06/2017 CLINICAL DATA:  Nasogastric tube placement. EXAM: PORTABLE ABDOMEN - 1 VIEW COMPARISON:  None. FINDINGS: Nasogastric tube tip projecting in mid stomach, side port past GE junction. Included bowel gas pattern is nondilated and nonobstructive. No intra-abdominal mass effect or pathologic calcifications. Mild probable vascular calcifications. Scoliosis and degenerative change of the lumbar spine. IMPRESSION: Nasogastric tube tip projects in mid stomach. Electronically Signed   By: Elon Alas M.D.   On: 03/06/2017 22:17   Mr Jodene Nam Head Wo Contrast  Result Date: 03/07/2017 CLINICAL DATA:  Altered mental status EXAM: MR HEAD WITHOUT CONTRAST MR CIRCLE OF WILLIS WITHOUT CONTRAST MRA OF THE NECK WITHOUT AND WITH CONTRAST TECHNIQUE: Multiplanar, multiecho pulse sequences of the brain, circle of willis and surrounding structures were obtained without intravenous contrast.  Angiographic images of the neck were obtained  using MRA technique without and with intravenous contrast. CONTRAST:  15 mL gadobenate dimeglumine (MULTIHANCE) injection COMPARISON:  Head CT 03/06/2017 FINDINGS: MRI HEAD FINDINGS Brain: The midline structures are normal. There is extensive diffusion restriction throughout both cerebral and cerebellar hemispheres, predominantly cortical and subcortical. The largest areas of abnormality are in the posterior right frontal lobe and right parietal lobe. The largest area on the left is in the anterior left frontal lobe. There is sparing of the anterior cerebral artery territory, but all other major vascular territories upper involved. Extensive hyperintense T2-weighted signal at the sites of diffusion abnormality. Minimal chronic microvascular disease. No mass lesion. No chronic microhemorrhage or cerebral amyloid angiopathy. No hydrocephalus, age advanced atrophy or lobar predominant volume loss. No dural abnormality or extra-axial collection. Skull and upper cervical spine: The visualized skull base, calvarium, upper cervical spine and extracranial soft tissues are normal. Sinuses/Orbits: No fluid levels or advanced mucosal thickening. No mastoid effusion. Normal orbits. MRA HEAD FINDINGS Intracranial internal carotid arteries: Normal. Anterior cerebral arteries: Normal. Middle cerebral arteries: Diminished flow related enhancement within the distal right MCA M1 and M2 branches is probably artifactual. On the source images, the vessel caliber is normal. Left MCA is normal. Posterior communicating arteries: Absent bilaterally. Posterior cerebral arteries: Normal. Basilar artery: Normal. Vertebral arteries: Left dominant. Normal. Superior cerebellar arteries: Normal. Anterior inferior cerebellar arteries: Normal. Posterior inferior cerebellar arteries: Normal. MRA NECK FINDINGS Aortic arch: Normal 3 vessel aortic branching pattern. The visualized subclavian arteries are  normal. Right carotid system: Normal course and caliber without stenosis or evidence of dissection. Left carotid system: Normal course and caliber without stenosis or evidence of dissection. Vertebral arteries: Left dominant. Vertebral artery origins are normal. Vertebral arteries are normal in course and caliber to the vertebrobasilar confluence without stenosis or evidence of dissection. IMPRESSION: 1. Multiple large areas of acute ischemia within both cerebral hemispheres with multiple small foci of acute ischemic infarction within both cerebellar hemispheres. The largest areas of abnormality are in the MCA distributions, but both PCA distributions are also involved. There is sparing of the anterior cerebral artery territory. Given the involvement of multiple vascular territories and the lack of abnormal vascular findings, acute hypoxic ischemic injury is the most probable explanation. 2. Normal MRA of the head and neck. This suggests that showering emboli are an unlikely explanation, though echocardiography may be helpful to exclude an embolic source within the patient's heart. 3. No acute hemorrhage or mass effect. 4. No cerebral amyloid angiopathy. Minimal chronic microvascular disease for age. Electronically Signed   By: Ulyses Jarred M.D.   On: 03/07/2017 03:31       ASSESSMENT / PLAN: Active Problems:   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CC)   Sepsis due to pneumonia ([place])   HCAP (healthcare-associated pneumonia)   Immunocompromised state ([place])   Acute hypoxemic respiratory failure (HCC)  DD incl HCAP , less likely  PJP or other opportunistic infections. Of course, he has a history of Velcade therapy & pulmonary infx could be related to drug toxicity ? Alternatively, pulmonary involvement of amyloidosis should also be considered.  BAL results are not yet available.  - ct high flow O2 -ct empiric ABs, pct less reliable in this setting - solumedrol 60 q 6h -Not clear how to interpret pos crypto Ag in this  setting, will defer to ID, may have to treat since no alternative diagnosis   Nephrotic syndrome/ Renal amyloid - hold lisinopril in acute setting  Acute encephalopathy -he has multiple defects in multiple vascular  territories likely representing embolic phenomenon whether these are blander infectious is not overt.  Cultures are pending and he is widely covered including coverage with Diflucan for potential cryptococcus.  I will be repeating a CT scan of his head this afternoon to ensure that his very poor mental status is not related to increasing edema related to his multiple lesions.  The patient is critically ill with multiple organ systems failure and requires high complexity decision making for assessment and support, frequent evaluation and titration of therapies, application of advanced monitoring technologies and extensive interpretation of multiple databases. Critical Care Time devoted to patient care services described in this note independent of APP/resident  time is 32 minutes.   Lars Masson, MD [facility] Pager [phone] If no response call [phone]      03/07/2017, 10:13 AM

## 2017-03-07 NOTE — Progress Notes (Signed)
EEG complete - results pending 

## 2017-03-07 NOTE — Procedures (Signed)
ELECTROENCEPHALOGRAM REPORT  Date of Study: 03/07/2017  Patient's Name: PRANISH AKHAVAN MRN: [id number] Date of Birth: [birth year]  Referring Provider: Candise Che, NP  Clinical History: This is a 77 year old man with altered mental status, multiple strokes.   Medications: Acyclovir Cefepime Insulin Synthroid  Technical Summary: A multichannel digital EEG recording measured by the international 10-20 system with electrodes applied with paste and impedances below 5000 ohms performed as portable with EKG monitoring in an intubated and unresponsive patient.  Hyperventilation and photic stimulation were not performed.  The digital EEG was referentially recorded, reformatted, and digitally filtered in a variety of bipolar and referential montages for optimal display.   Description: The patient is intubated and unresponsive during the recording. No sedating medications listed. There is no clear posterior dominant rhythm seen. The background consists of a large amount of diffuse 4-5 Hz theta and 2-3 Hz delta slowing. Normal sleep architecture is not seen. Hyperventilation and photic stimulation were not performed. There were no epileptiform discharges or electrographic seizures seen.    EKG lead was unremarkable.  Impression: This EEG is abnormal due to moderate diffuse background slowing.  Clinical Correlation of the above findings indicates diffuse cerebral dysfunction that is non-specific in etiology and can be seen with hypoxic/ischemic injury, toxic/metabolic encephalopathies, neurodegenerative disorders, or medication effect.  The absence of epileptiform discharges does not rule out a clinical diagnosis of epilepsy.  Clinical correlation is advised.   Ellouise Newer, M.D.

## 2017-03-07 NOTE — Progress Notes (Signed)
Pharmacy Antibiotic Note  Gregory Galvan is a 77 y.o. male admitted on 03/10/2017 with pneumonia.   He continues to worsen despite outpatient doxycycline.  PCN allergy noted.  He has tolerated 1st dose of Cefepime in ED without any adverse effects.  Pharmacy has been consulted for Vancomycin & Cefepime dosing.  Vancomycin trough level = 11  (Goal 15-20)  Plan: 1) Continue Cefepime 1gm IV q8h 2) Increase vancomycin to 1g q 12 hrs.  Recheck trough level at steady state 3) Monitor renal function and cx data  4) If MRSA PCR neg, recommend discontinue vanc  Height: 5\' 9"  (175.3 cm) Weight: 175 lb 14.8 oz (79.8 kg) IBW/kg (Calculated) : 70.7  Temp (24hrs), Avg:98 F (36.7 C), Min:97.7 F (36.5 C), Max:98.5 F (36.9 C)  Recent Labs  Lab 02/15/2017 1833 02/18/2017 1854 02/21/2017 2048 03/04/17 0043 03/04/17 0500 03/05/17 0620 03/06/17 1142 03/07/17 1052 03/07/17 1832  WBC 11.8*  --   --  8.9 9.0 9.7 14.6*  --   --   CREATININE 1.07  --   --  0.94 0.90 0.73 0.84  --   --   LATICACIDVEN  --  3.39* 1.48  --   --   --   --  1.4  --   VANCOTROUGH  --   --   --   --   --   --   --   --  11*    Estimated Creatinine Clearance: 77.2 mL/min (by C-G formula based on SCr of 0.84 mg/dL).    Allergies  Allergen Reactions  . 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 Sulfonamide Derivatives Itching and Swelling  . Penicillins Swelling and Rash    Has patient had a PCN reaction causing immediate rash, facial/tongue/throat swelling, SOB or lightheadedness with hypotension: YES Has patient had a PCN reaction causing severe rash involving mucus membranes or skin necrosis:YES (PENILE SWELLING AND RASH) Has patient had a PCN reaction that required hospitalization:YES Has patient had a PCN reaction occurring within the last 10 years: nO If all of the above answers are "NO", then may proceed with Cephalosporin use.     Antimicrobials this admission: 1/18  Vanc>> 1/18 Cefepime>> 1/21 Fluconazole >>   Dose adjustments this admission: 1/22 Vanc trough = 11 on 1500 mg q 24 hrs, inc to 1g q 12 hrs  Microbiology results: 1/18 BCx: ngtd 1/18 MRSA PCR: negative 1/20 resp virus panel: neg, influenza panel: neg 1/20 sputum Pneumocytis smear: neg, PCR: IP (ID recs bronch for lower secretions if intubated) 1/20 Cryptococcal Ag: POSITIVE (per ID note, in an immunocompromised host this could represent cryptococcal pneumonia/pneumonitis) 1/20 Flu - NEG  Thank you for allowing pharmacy to be a part of this patient's care.   Uvaldo Rising, BCPS  Clinical Pharmacist Pager [phone]  03/07/2017 8:00 PM

## 2017-03-07 NOTE — Progress Notes (Signed)
NEUROHOSPITALISTS STROKE TEAM - DAILY PROGRESS NOTE   ADMISSION HISTORY: Gregory Galvan is an 77 y.o. male, past medical history of hyperlipidemia, hypertension, BPH, hypothyroidism and renal amyloidosis receiving Velcade and dexamethasone who presented to [facility] long hospital with progressive shortness of breath and interstitial lung changes on chest x-ray on 02/14/2017   Patient currently intubated and unable to give history of present illness or review of systems.  History obtained from chart, patient had a cold and was treated outpatient with doxycycline which did not improve his symptoms, he was evaluated by his oncologist Dr. Irene Limbo and sent to the ER.  On admission at [facility],  patient was diagnosed with bilateral pneumonia and treated with BiPAP, overnight post ed and was placed on nonrebreather mask.  Per his  family at bedside, since January of this year, patient started experiencing hearing loss which progressively got worse this week.  They state the patient was not confused but was very hard and of hearing.  Patient experienced a fall overnight on 1/21, CT of the head and x-ray of the shoulders at that time was negative.  Patient progressively continues to be somnolent and not easily arousable unresponsive to stimulus.  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    Patient continued to be somnolent with progressive worsening respiratory failure and has since been intubated and transferred to [facility] hospital.  Family bedside patient did not have a history of strokes or neurological problems.  He was ambulatory and independent.  Currently we are unable to assess his deficits secondary to being intubed and sedated.  LSN: unknown tPA Given: No: Did not meet time criteria.  SUBJECTIVE (INTERVAL HISTORY) No family is  at the bedside. Patient is found laying in bed in NAD. Remains intubated and sedated. No new/acute events reported overnight.   OBJECTIVE Lab Results: CBC:  Recent Labs  Lab 03/04/17 0500 03/05/17 0620 03/06/17 1142  WBC 9.0 9.7 14.6*  HGB 11.7* 11.1* 11.1*  HCT 34.0* 33.5* 33.2*  MCV 93.9 96.3 96.0  PLT 84* 72* 46*   BMP: Recent Labs  Lab 03/01/17 1325 03/10/2017 1833 03/04/17 0043 03/04/17 0500 03/05/17 0620 03/06/17 1142  NA 132* 133*  --  134* 135 135  K 4.0 4.7  --  4.4 3.9 4.2  CL 102 101  --  107 107 106  CO2 22 22  --  22 24 23   GLUCOSE 94 140*  --  152* 89 134*  BUN 17 24*  --  21* 21* 19  CREATININE  --  1.07 0.94 0.90 0.73 0.84  CALCIUM 8.1* 9.0  --  8.0* 8.1* 8.2*   Liver Function Tests:  Recent Labs  Lab 03/01/17 1325 02/17/2017 1833 03/06/17 1142  AST 48* 71* 61*  ALT 29 42 32  ALKPHOS 88 114 125  BILITOT 0.4 <0.1* 0.6  PROT 4.9* 6.1* 4.7*  ALBUMIN 2.0* 2.6* 2.1*   PHYSICAL EXAM Temp:  [97.7 F (36.5 C)-98.8 F (37.1 C)] 98.2 F (36.8 C) (01/22 1200) Pulse Rate:  [70-99] 93 (01/22 1500) Resp:  [11-27] 15 (01/22 1500) BP: (89-120)/(68-88) 120/81 (01/22 1500) SpO2:  [94 %-100 %] 96 % (01/22 1500) FiO2 (%):  [40 %-100 %] 40 % (01/22 1500) Weight:  [79.8 kg (175 lb 14.8 oz)] 79.8 kg (175 lb 14.8 oz) (01/22 0409) General - Well nourished, intubate and sedated, appears critically ill HEENT-  Normocephalic, Cardiovascular -  Regular rate and rhythm  Respiratory - No wheezing. Abdomen - soft and non-tender, Extremities- no edema or cyanosis Neurological Examination Mental Status: Remains intubated and sedated. Does not open eyes to command and following commands. Cranial Nerves: II: Discs flat bilaterally; Visual fields grossly normal, bilateral conjunctival injection III,IV, VI: Pupils equal and reactive to light and accommodation but sluggish, negative doll's eyes minimal corneal reflex on the left and none on the right. V,VII: Face appears  symmetric but unable to accurately assess secondary to intubation tubes  Motor: No  voluntary motor movements even with strong noxious stimulus Tone and bulk:normal tone throughout; no atrophy noted Sensory: No reaction to harsh even while off sedation.   Deep Tendon Reflexes and Plantars: Mute Cerebellar: Unable to assess due to sedation Gait: Unable to assess  IMAGING: I have personally reviewed the radiological images below and agree with the radiology interpretations. Ct Head Wo Contrast Result Date: 03/07/2017 IMPRESSION: Multifocal acute infarct without hemorrhagic conversion or visible progression compared to brain MRI earlier today. Electronically Signed   By: Monte Fantasia M.D.   On: 03/07/2017 12:52   Ct Head Wo Contrast Result Date: 03/06/2017 IMPRESSION: Acute right posterior frontal-parietal nonhemorrhagic MCA distribution infarct superimposed on chronic microvascular ischemia and chronic bilateral basal ganglial lacunar infarcts. These results will be called to the ordering clinician or representative by the Radiologist Assistant, and communication documented in the PACS or zVision Dashboard. Electronically Signed   By: Ashley Royalty M.D.   On: 03/06/2017 13:47   Ct Head Wo Contrast Result Date: 03/05/2017  IMPRESSION: Chronic ischemic microangiopathy and old basal ganglia lacunar infarcts without acute intracranial abnormality. Electronically Signed   By: Ulyses Jarred M.D.   On: 03/05/2017 22:35   Gregory Jodene Nam Brain/Head/Neck WU Contrast Result Date: 03/07/2017 IMPRESSION: 1. Multiple large areas of acute ischemia within both cerebral hemispheres with multiple small foci of acute ischemic infarction within both cerebellar hemispheres. The largest areas of abnormality are in the MCA distributions, but both PCA distributions are also involved. There is sparing of the anterior cerebral artery territory. Given the involvement of multiple vascular territories and the lack of abnormal  vascular findings, acute hypoxic ischemic injury is the most probable explanation. 2. Normal MRA of the head and neck. This suggests that showering emboli are an unlikely explanation, though echocardiography may be helpful to exclude an embolic source within the patient's heart. 3. No acute hemorrhage or mass effect. 4. No cerebral amyloid angiopathy. Minimal chronic microvascular disease for age. Electronically Signed   By: Ulyses Jarred M.D.   On: 03/07/2017 03:31   Echocardiogram:                                              PENDING EEG:                                                                   PENDING    IMPRESSION: Gregory. DINA Galvan is a 77 y.o. male with PMH of hyperlipidemia, hypertension, BPH, hypothyroidism and renal amyloidosis receiving Velcade and dexamethasone who presented to [facility] hospital with progressive shortness of breath and bilateral  pneumonia.  Patient explains a fall while in hospital, initial CT post fall was normal.  Patient progressively became somnolent and not easily arousable, repeat CT of the head revealed acute right posterior frontoparietal MCA stroke.  Patient was transferred at [facility] due to persistent somnolence, altered mental status and high risk for respiratory failure patient was intubated. Not a candidate for tPA due to out of window.  Multiple large areas of acute ischemia within both cerebral hemispheres with multiple small foci of acute ischemic infarction within both cerebellar hemispheres.   Suspected Etiology: Likely cardioembolic source  Resultant Symptoms: bilateral weakness, Aphasia, Encephalopathic  Stroke Risk Factors: diabetes mellitus, hyperlipidemia and hypertension Other Stroke Risk Factors: Advanced age, Hx stroke, Hx renal amyloidosis   Outstanding Stroke Work-up Studies:     Echocardiogram:                                                    PENDING EEG:                                                                       PENDING  03/07/2017 ASSESSMENT:   Neuro exam unchanged, encephlopathic. Patient remains intubated, sedated and critically ill. Will obtain EEG to rule out seizure activity. Will eventually need TEE once medically stable to rule out embolic source of strokes. Resume ASA and Statin once medically stable. Repeat CT Head without hemorraghic conversion/edema.  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 Nicardipine drip, Labetolol PRN Long term BP goal normotensive. May slowly restart home B/P medications after 48 hours Home Meds: Lisinopril  HYPERLIPIDEMIA:    Component Value Date/Time   CHOL 154 03/07/2017 0456   TRIG 128 03/07/2017 0456   HDL 58 03/07/2017 0456   CHOLHDL 2.7 03/07/2017 0456   VLDL 26 03/07/2017 0456   LDLCALC 70 03/07/2017 0456  Home Meds:  Zetia 10 mg LDL  goal < 70 Continue Zetia at discharge, if appropriate  PRE-DIABETES: Lab Results  Component Value  Date   HGBA1C 5.9 (H) 03/07/2017  HgbA1c goal < 7.0 Currently on: Novolog Continue CBG monitoring and SSI to maintain glucose 140-180 mg/dl DM education   Other Active Problems: Active Problems:   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[facility])   Sepsis due to pneumonia (  [place])   HCAP (healthcare-associated pneumonia)   Immunocompromised state ([place])   Acute hypoxemic respiratory failure [facility])    Hospital day # 4 VTE prophylaxis: SCD's  Diet : Diet NPO time specified Fall precautions   FAMILY UPDATES: No family at bedside  TEAM UPDATES: Rigoberto Noel, MD   Prior Home Stroke Medications:  No antithrombotic  Discharge Stroke Meds:  Please discharge patient on No antithrombotic at this time  Disposition: Final discharge disposition not confirmed Therapy Recs:               PENDING Follow Up:  Follow-up Information    Garvin Fila, MD. Schedule an appointment as soon as possible for a visit in 6 week(s).   Specialties:  Neurology, Radiology Contact information: [address] [place] [address] [phone]          Unk Pinto, MD -PCP Follow up in 1-2 weeks      Assessment & plan discussed with with attending physician and they are in agreement.    Mary Sella, ANP-C Stroke Neurology Team 03/07/2017 4:01 PM I have personally examined this patient, reviewed notes, independently viewed imaging studies, participated in medical decision making and plan of care.ROS completed by me personally and pertinent positives fully documented  I have made any additions or clarifications directly to the above note. Agree with note above.  Patient was admitted with subacute respiratory failure and a [place] mental status after admission and MRI scan shows multifocal bilateral hemispheric as well as cerebellar embolic infarcts in etiology to be determined. His neurological exam and general medical status is quite poor and his prognosis for surviving without major deficits and making  independent living quite slim. I had long discussion at the bedside with the patient's nephew's wife and expended poor prognosis and answered questions. Discussed with Dr. Pearline Cables This patient is critically ill and at significant risk of neurological worsening, death and care requires constant monitoring of vital signs, hemodynamics,respiratory and cardiac monitoring, extensive review of multiple databases, frequent neurological assessment, discussion with family, other specialists and medical decision making of high complexity.I have made any additions or clarifications directly to the above note.This critical care time does not reflect procedure time, or teaching time or supervisory time of PA/NP/Med Resident etc but could involve care discussion time.  I spent 35 minutes of neurocritical care time  in the care of  this patient.      Antony Contras, MD Medical Director [facility] Pager: [phone] 03/07/2017 4:39 PM  To contact Stroke Continuity provider, please refer to [email or web address] After hours, contact General Neurology

## 2017-03-07 NOTE — Progress Notes (Signed)
Initial Nutrition Assessment  DOCUMENTATION CODES:   Not applicable  INTERVENTION:  If unable to extubate, Recommend TF with Vital 1.5 formula at goal rate of 40 ml/h (960 ml per day) and Prostat 30 ml TID to provide 1740 kcals, 110 gm protein, 730 ml free water daily.  NUTRITION DIAGNOSIS:   Inadequate oral intake related to inability to eat as evidenced by NPO status.  GOAL:   Patient will meet greater than or equal to 90% of their needs  MONITOR:   Vent status, Labs, Skin, I & O's, Weight trends  REASON FOR ASSESSMENT:   Ventilator    ASSESSMENT:   77 y.o. male past medical history of hyperlipidemia, hypertension, BPH, hypothyroidism and renal amyloidosis receiving Velcade and dexamethasone who was admitted to [facility] on 03/04/2017, bilateral pneumonia.  Patient explains a fall while in hospital, initial CT post fall was normal.  Patient progressively became somnolent and not easily arousable, repeat CT of the head revealed acute right posterior frontoparietal MCA stroke.  Patient was transferred at [facility]. due to persistent somnolence, altered mental status and high risk for respiratory failure patient was intubated.  Patient is currently intubated on ventilator support MV: 10.5 L/min Temp (24hrs), Avg:98.1 F (36.7 C), Min:97.7 F (36.5 C), Max:98.8 F (37.1 C)  Propofol: none  Family at bedside. She reports pt eating well PTA with no other difficulties. Pt with no significant weight loss per weight records. Per MD note, concerned for cryptococcal meningitis and warrants investigation.   Labs and medications reviewed.   NUTRITION - FOCUSED PHYSICAL EXAM:    Most Recent Value  Orbital Region  Unable to assess  Upper Arm Region  No depletion  Thoracic and Lumbar Region  No depletion  Buccal Region  Unable to assess  Temple Region  Unable to assess  Clavicle Bone Region  Mild depletion  Clavicle and Acromion Bone Region  Mild depletion  Scapular  Bone Region  Unable to assess  Dorsal Hand  Unable to assess  Patellar Region  No depletion  Anterior Thigh Region  No depletion  Posterior Calf Region  No depletion  Edema (RD Assessment)  Mild  Hair  Reviewed  Eyes  Unable to assess  Mouth  Unable to assess  Skin  Reviewed  Nails  Reviewed       Diet Order:  Diet NPO time specified Fall precautions  EDUCATION NEEDS:   Not appropriate for education at this time  Skin:  Skin Assessment: Reviewed RN Assessment  Last BM:  1/21  Height:   Ht Readings from Last 1 Encounters:  03/06/17 5\' 9"  (1.753 m)    Weight:   Wt Readings from Last 1 Encounters:  03/07/17 175 lb 14.8 oz (79.8 kg)    Ideal Body Weight:  72.7 kg  BMI:  Body mass index is 25.98 kg/m.  Estimated Nutritional Needs:   Kcal:  0630  Protein:  100-115 grams  Fluid:  Per MD    Corrin Parker, MS, RD, LDN Pager # [phone] After hours/ weekend pager # [phone]

## 2017-03-07 NOTE — Progress Notes (Signed)
OT Cancellation    03/07/17 1000  OT Visit Information  Last OT Received On 03/07/17  Reason Eval/Treat Not Completed Patient not medically ready. Per Dr. Leonie Man pt very sick and not responsive. Will await medical improvement before progressing with OT eval. Thank you.   Springer, OTR/L Acute Rehab Pager: [phone] Office: [phone]

## 2017-03-07 NOTE — Progress Notes (Signed)
Patient was transported to CT & back to 4N21 without any complications.  

## 2017-03-07 NOTE — Progress Notes (Signed)
eLink Physician-Brief Progress Note Patient Name: Gregory Galvan DOB: 11-19-1940 MRN: [id number]   Date of Service  03/07/2017  HPI/Events of Note  Notified of need for DVT prophylaxis.    eICU Interventions  Will order SCD's.     Intervention Category Intermediate Interventions: Best-practice therapies (e.g. DVT, beta blocker, etc.)  [name] 03/07/2017, 3:40 AM

## 2017-03-07 NOTE — Progress Notes (Signed)
PT Cancellation Note  Patient Details Name: Gregory Galvan MRN: [id number] DOB: 1940-10-11   Cancelled Treatment:    Reason Eval/Treat Not Completed: Patient not medically ready. Per Dr. Leonie Man pt very sick and not responsive. Will await medical improvement before progressing with PT eval.    Saige Busby M Letica Giaimo 03/07/2017, 9:49 AM   Kittie Plater, PT, DPT Pager #: [phone] Office #: [phone]

## 2017-03-07 NOTE — Progress Notes (Signed)
Patient transported to MRI and back to 4N21 without any complications.

## 2017-03-08 ENCOUNTER — Other Ambulatory Visit: Payer: Medicare Other

## 2017-03-08 ENCOUNTER — Inpatient Hospital Stay (HOSPITAL_COMMUNITY): Payer: Medicare Other

## 2017-03-08 ENCOUNTER — Ambulatory Visit: Payer: Medicare Other | Admitting: Hematology

## 2017-03-08 ENCOUNTER — Ambulatory Visit: Payer: Medicare Other

## 2017-03-08 DIAGNOSIS — A419 Sepsis, unspecified organism: Secondary | ICD-10-CM

## 2017-03-08 DIAGNOSIS — R4182 Altered mental status, unspecified: Secondary | ICD-10-CM

## 2017-03-08 DIAGNOSIS — J9601 Acute respiratory failure with hypoxia: Secondary | ICD-10-CM

## 2017-03-08 LAB — MULTIPLE MYELOMA PANEL, SERUM
ALBUMIN SERPL ELPH-MCNC: 2 g/dL — AB (ref 2.9–4.4)
Albumin/Glob SerPl: 0.9 (ref 0.7–1.7)
Alpha 1: 0.3 g/dL (ref 0.0–0.4)
Alpha2 Glob SerPl Elph-Mcnc: 1.1 g/dL — ABNORMAL HIGH (ref 0.4–1.0)
B-Globulin SerPl Elph-Mcnc: 0.6 g/dL — ABNORMAL LOW (ref 0.7–1.3)
Gamma Glob SerPl Elph-Mcnc: 0.3 g/dL — ABNORMAL LOW (ref 0.4–1.8)
Globulin, Total: 2.4 g/dL (ref 2.2–3.9)
IGA: 57 mg/dL — AB (ref 61–437)
IGM (IMMUNOGLOBULIN M), SRM: 35 mg/dL (ref 15–143)
IgG (Immunoglobin G), Serum: 370 mg/dL — ABNORMAL LOW (ref 700–1600)
M Protein SerPl Elph-Mcnc: 0.2 g/dL — ABNORMAL HIGH
TOTAL PROTEIN ELP: 4.4 g/dL — AB (ref 6.0–8.5)

## 2017-03-08 LAB — CBC WITH DIFFERENTIAL/PLATELET
BASOS PCT: 0 %
Basophils Absolute: 0 10*3/uL (ref 0.0–0.1)
EOS ABS: 0 10*3/uL (ref 0.0–0.7)
Eosinophils Relative: 0 %
HEMATOCRIT: 31.1 % — AB (ref 39.0–52.0)
HEMOGLOBIN: 10.5 g/dL — AB (ref 13.0–17.0)
Lymphocytes Relative: 3 %
Lymphs Abs: 0.4 10*3/uL — ABNORMAL LOW (ref 0.7–4.0)
MCH: 32.5 pg (ref 26.0–34.0)
MCHC: 33.8 g/dL (ref 30.0–36.0)
MCV: 96.3 fL (ref 78.0–100.0)
MONOS PCT: 1 %
Monocytes Absolute: 0.2 10*3/uL (ref 0.1–1.0)
NEUTROS ABS: 12.6 10*3/uL — AB (ref 1.7–7.7)
NEUTROS PCT: 96 %
Platelets: 36 10*3/uL — ABNORMAL LOW (ref 150–400)
RBC: 3.23 MIL/uL — AB (ref 4.22–5.81)
RDW: 14.5 % (ref 11.5–15.5)
WBC: 13.1 10*3/uL — AB (ref 4.0–10.5)

## 2017-03-08 LAB — COMPREHENSIVE METABOLIC PANEL
ALK PHOS: 122 U/L (ref 38–126)
ALT: 31 U/L (ref 17–63)
ANION GAP: 9 (ref 5–15)
AST: 61 U/L — ABNORMAL HIGH (ref 15–41)
Albumin: 2 g/dL — ABNORMAL LOW (ref 3.5–5.0)
BILIRUBIN TOTAL: 0.8 mg/dL (ref 0.3–1.2)
BUN: 27 mg/dL — ABNORMAL HIGH (ref 6–20)
CALCIUM: 8 mg/dL — AB (ref 8.9–10.3)
CO2: 20 mmol/L — ABNORMAL LOW (ref 22–32)
CREATININE: 0.84 mg/dL (ref 0.61–1.24)
Chloride: 108 mmol/L (ref 101–111)
GFR calc non Af Amer: >60 mL/min (ref >60)
Glucose, Bld: 135 mg/dL — ABNORMAL HIGH (ref 65–99)
Potassium: 3.9 mmol/L (ref 3.5–5.1)
SODIUM: 137 mmol/L (ref 135–145)
TOTAL PROTEIN: 4 g/dL — AB (ref 6.5–8.1)

## 2017-03-08 LAB — CULTURE, BLOOD (ROUTINE X 2)
Culture: NO GROWTH
Culture: NO GROWTH
Special Requests: ADEQUATE
Special Requests: ADEQUATE

## 2017-03-08 LAB — GLUCOSE, CAPILLARY
GLUCOSE-CAPILLARY: 130 mg/dL — AB (ref 65–99)
GLUCOSE-CAPILLARY: 161 mg/dL — AB (ref 65–99)
Glucose-Capillary: 125 mg/dL — ABNORMAL HIGH (ref 65–99)
Glucose-Capillary: 146 mg/dL — ABNORMAL HIGH (ref 65–99)

## 2017-03-08 LAB — PROCALCITONIN: PROCALCITONIN: 0.15 ng/mL

## 2017-03-08 LAB — PROTIME-INR
INR: 1.56
Prothrombin Time: 18.5 seconds — ABNORMAL HIGH (ref 11.4–15.2)

## 2017-03-08 LAB — MAGNESIUM: Magnesium: 2.3 mg/dL (ref 1.7–2.4)

## 2017-03-08 LAB — ECHOCARDIOGRAM COMPLETE
Height: 69 in
WEIGHTICAEL: 2793.67 [oz_av]

## 2017-03-08 LAB — PHOSPHORUS: Phosphorus: 2.7 mg/dL (ref 2.5–4.6)

## 2017-03-08 MED ORDER — VITAL 1.5 CAL PO LIQD
1000.0000 mL | ORAL | Status: DC
  Administered 2017-03-08 – 2017-03-09 (×2): 1000 mL
  Filled 2017-03-08 (×4): qty 1000

## 2017-03-08 MED ORDER — PRO-STAT SUGAR FREE PO LIQD
30.0000 mL | Freq: Three times a day (TID) | ORAL | Status: DC
  Administered 2017-03-08 – 2017-03-10 (×6): 30 mL
  Filled 2017-03-08 (×6): qty 30

## 2017-03-08 MED ORDER — VITAMIN K1 10 MG/ML IJ SOLN
10.0000 mg | Freq: Once | INTRAMUSCULAR | Status: AC
  Administered 2017-03-08: 10 mg via SUBCUTANEOUS
  Filled 2017-03-08: qty 1

## 2017-03-08 NOTE — Progress Notes (Signed)
OT Cancellation    03/08/17 1100  OT Visit Information  Last OT Received On 03/08/17  Reason Eval/Treat Not Completed Patient not medically ready. Will return as schedule allows and pt medically ready. Thank you.   Eleva, OTR/L Acute Rehab Pager: [phone] Office: [phone]

## 2017-03-08 NOTE — Progress Notes (Signed)
   03/08/17 1600  Clinical Encounter Type  Visited With Patient and family together  Visit Type Follow-up  Referral From Nurse  Consult/Referral To Chaplain  Spiritual Encounters  Spiritual Needs Emotional  Chaplain visited with PT family to discuss end of life and palliative care meeting.  The PT life partner had questions about an advanced directive and how it can be executed.  Because the PT cannot sign or give consent no AD can be completed.  The PT life partner has a meeting with Palliative Care to discuss what the rest of life options look like.  The PT has a brother that does not want to take part in any of the decisions and he will get a notarized document stating such.  The primary conversation between the PT Life Partner and the Chaplain centered on PT quality of life.  The primary question is will the PT have a personality or will they just be a person

## 2017-03-08 NOTE — Progress Notes (Signed)
  Echocardiogram 2D Echocardiogram has been performed.  Gregory Galvan 03/08/2017, 10:23 AM

## 2017-03-08 NOTE — Progress Notes (Signed)
225 ml Fentanyl wasted in sink.  Verify with Margart Sickles.

## 2017-03-08 NOTE — Progress Notes (Signed)
PT Cancellation Note  Patient Details Name: DAVIS AMBROSINI MRN: [id number] DOB: Nov 30, 1940   Cancelled Treatment:    Reason Eval/Treat Not Completed: Patient not medically ready. PT to return as able once medically stable.   Leaira Fullam M Dontea Corlew 03/08/2017, 10:43 AM   Kittie Plater, PT, DPT Pager #: [phone] Office #: [phone]

## 2017-03-08 NOTE — Social Work (Addendum)
CSW discussed case with RNCM.    CSW will need to f/u on who can make medical decisions for patient as it appears brother is unwilling to do so.  Pt does has a close friend that is involved in care.  CSW discussed with RN on the floor.  Palliative will f/u.  CSW will continue to follow.  Elissa Hefty, LCSW Clinical Social Worker [phone]

## 2017-03-08 NOTE — Progress Notes (Signed)
Nutrition Follow-up  INTERVENTION:   Vital 1.5 @ 40 ml/hr (960 ml/day) 30 ml Prostat TID  Provides: 1740 kcal, 110 grams protein, and 730 ml free water.    NUTRITION DIAGNOSIS:   Inadequate oral intake related to inability to eat as evidenced by NPO status. Ongoing.   GOAL:   Patient will meet greater than or equal to 90% of their needs Progressing.   MONITOR:   Vent status, Labs, Skin, I & O's, Weight trends  REASON FOR ASSESSMENT:   Consult Enteral/tube feeding initiation and management  ASSESSMENT:   77 y.o. male past medical history of hyperlipidemia, hypertension, BPH, hypothyroidism and renal amyloidosis receiving Velcade and dexamethasone who was admitted to [facility] on 03/04/2017, bilateral pneumonia.  Patient explains a fall while in hospital, initial CT post fall was normal.  Patient progressively became somnolent and not easily arousable, repeat CT of the head revealed acute right posterior frontoparietal MCA stroke.  Patient was transferred at [facility]. due to persistent somnolence, altered mental status and high risk for respiratory failure patient was intubated.  Pt discussed during ICU rounds and with RN.   Per MD notes pt with multiple ischemic infarction within both cerebellar hemispheres. Possible cryptococcal dx, no LP planned. Poor prognosis palliative care consult pending.   Patient is currently intubated on ventilator support MV: 15.9 L/min Temp (24hrs), Avg:98.4 F (36.9 C), Min:97.8 F (36.6 C), Max:98.8 F (37.1 C)  Propofol: none Medications reviewed and include: SSI, synthroid, prednisone Labs reviewed CBG's: 136-125-130 MAP: 100   Diet Order:  Diet NPO time specified Fall precautions  EDUCATION NEEDS:   Not appropriate for education at this time  Skin:  Skin Assessment: Reviewed RN Assessment  Last BM:  1/21  Height:   Ht Readings from Last 1 Encounters:  03/06/17 5\' 9"  (1.753 m)    Weight:   Wt Readings  from Last 1 Encounters:  03/08/17 174 lb 9.7 oz (79.2 kg)    Ideal Body Weight:  72.7 kg  BMI:  Body mass index is 25.78 kg/m.  Estimated Nutritional Needs:   Kcal:  3335  Protein:  100-115 grams  Fluid:  Per MD  Maylon Peppers RD, LDN, [place] Pager [phone] After Hours Pager

## 2017-03-08 NOTE — Progress Notes (Signed)
[facility] Pulmonary Diseases & Critical Care Medicine Initial Pulmonary/Critical Care Consultation  Patient Name: Gregory Galvan MRN: [id number] DOB: [birth year]    ADMISSION DATE:  03/08/2017 CONSULTATION DATE:  03/05/2017  REFERRING MD:  Dr. Landis Gandy  REASON FOR CONSULTATION:  hypoxia   HISTORY OF PRESENT ILLNESS  This 77 y.o. Caucasian male is seen in consultation at the request of Dr. Landis Gandy for recommendations on further evaluation and management of acute hypoxic respiratory failure. The patient was admitted after failed outpatient treatment for "pneumonia". He has BL infx & rapidly increased in supplemental oxygen requirement.  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  His mental status deteriorated 1/21, and he developed anisocoria and a CT scan of the head was obtained which showed a frontal lesion.  Subsequently an MRI was performed which shows multiple defects in many vascular territories.  He has required intubation both for airway protection and oxygenation on 1/21.  A BAL was performed on 1/21.  There are still no significant results back from the BAL yet.  He had a cryptococcus antigen earlier in his hospital course which was positive.  He had a HIV titer performed on 10/08/2019 which was negative.  He remains no sedation and not at all interactive.  He is requiring only 40% oxygen this morning and 8 PEEP  ANTIBIOTICS: Cefepime clindamycin  SIGNIFICANT EVENTS: 1/20 >> transferred to stepdown unit for increasing O2 requirement 1/21 confused -fall overnight - head CT, XR shoulder neg  LINES/TUBES: -   SUBJ Afebrile Remains on high flow O2 COnfused, fall overnight, sedated with ativan   VITAL SIGNS: BP 120/83   Pulse 85   Temp 98.6 F (37 C) (Axillary)   Resp 13   Ht _0  (1.753 m)   Wt 174 lb 9.7 oz (79.2 kg)   SpO2 94%   BMI 25.78 kg/m   HEMODYNAMICS:    VENTILATOR SETTINGS: Vent  Mode: PRVC FiO2 (%):  [40 %-100 %] 40 % Set Rate:  [15 bmp-18 bmp] 18 bmp Vt Set:  [550 mL] 550 mL PEEP:  [8 cmH20] 8 cmH20 Pressure Support:  [10 cmH20] 10 cmH20 Plateau Pressure:  [26 cmH20-28 cmH20] 28 cmH20  INTAKE / OUTPUT: I/O last 3 completed shifts: In: 3489.5 [I.V.:1589.5; IV [id number]:4259] Out: 2970 [Urine:2970]  PHYSICAL EXAMINATION: General: Orally intubated and not at all interactive despite being on no sedation.   Throat/Oral Cavity: Normal dentition. No oral thrush. No exudate. Mucous membranes are moist. No tonsillar enlargement. Neck: supple, no thyromegaly, no JVD, no lymphadenopathy. Trachea midline. Chest/Lung: symmetric in development and expansion. Good air entry.  BL scattered insp  rhonchi  Heart: Regular S1 and S2 without murmur, or rub there is an intermittent gallop.  Abdomen: soft, nontender, nondistended. Normoactive bowel sounds. n rebound. No guarding. Extremities: There are raised purpuric lesions on both forearms.   Lymphatic: no cervical/axiallary/inguinal lymph nodes appreciated NEURO: He has spontaneous eye opening but does not respond to voice or threat.  There is minimal movement of the left lower extremity in response to sternal rub.  EOMs appear to be intact to doll's eyes pupils are currently equal.  He has bilateral sub-conjunctival injections.  LABS:  BMET Recent Labs  Lab 03/05/17 0620 03/06/17 1142 03/08/17 0304  NA 135 135 137  K 3.9 4.2 3.9  CL 107 106 108  CO2 24 23 20*  BUN 21* 19 27*  CREATININE 0.73 0.84 0.84  GLUCOSE 89 134* 135*  Electrolytes Recent Labs  Lab 03/05/17 0620 03/06/17 1142 03/08/17 0304  CALCIUM 8.1* 8.2* 8.0*    CBC Recent Labs  Lab 03/05/17 0620 03/06/17 1142 03/08/17 0304  WBC 9.7 14.6* 13.1*  HGB 11.1* 11.1* 10.5*  HCT 33.5* 33.2* 31.1*  PLT 72* 46* 36*    Coag's Recent Labs  Lab 03/04/2017 1833 03/08/17 0304  INR 1.08 1.56    Sepsis Markers Recent Labs  Lab 02/26/2017 1854  02/14/2017 2048 03/05/17 1514 03/07/17 1052 03/08/17 0304  LATICACIDVEN 3.39* 1.48  --  1.4  --   PROCALCITON  --   --  0.18 0.18 0.15    ABG Recent Labs  Lab 03/05/17 0840 03/06/17 0334 03/06/17 1751  PHART 7.465* 7.463* 7.371  PCO2ART 31.2* 32.2 39.8  PO2ART 68.4* 64.6* 202.0*    Liver Enzymes Recent Labs  Lab 03/02/2017 1833 03/06/17 1142 03/08/17 0304  AST 71* 61* 61*  ALT 42 32 31  ALKPHOS 114 125 122  BILITOT <0.1* 0.6 0.8  ALBUMIN 2.6* 2.1* 2.0*    Cardiac Enzymes No results for input(s): TROPONINI, PROBNP in the last 168 hours.  Glucose Recent Labs  Lab 03/06/17 2320 03/07/17 0752 03/07/17 1144 03/07/17 1657 03/07/17 2253 03/08/17 0728  GLUCAP 149* 136* 133* 117* 136* 125*    Imaging Ct Head Wo Contrast  Result Date: 03/07/2017 CLINICAL DATA:  Altered level of consciousness.  Fall. EXAM: CT HEAD WITHOUT CONTRAST TECHNIQUE: Contiguous axial images were obtained from the base of the skull through the vertex without intravenous contrast. COMPARISON:  Brain MRI from earlier today FINDINGS: Brain: Multifocal cytotoxic edema in the bilateral MCA and PCA distributions, extent matching brain MRI earlier today. Smaller acute infarcts seen in the left more than right cerebellum. No hemorrhagic conversion or visible progression. No shift or hydrocephalus. No masslike finding. Vascular: Atherosclerotic calcification.  No hyperdense vessel. Skull: Negative Sinuses/Orbits: Bilateral mastoid and middle ear opacification. The mastoids are small, suggesting chronic disease. Negative nasopharynx. IMPRESSION: Multifocal acute infarct without hemorrhagic conversion or visible progression compared to brain MRI earlier today. Electronically Signed   By: Monte Fantasia M.D.   On: 03/07/2017 12:52       ASSESSMENT / PLAN: Active Problems:   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CC)   Sepsis due to pneumonia ([place])   HCAP (healthcare-associated pneumonia)   Immunocompromised state ([place])   Acute  hypoxemic respiratory failure ([place])   Acute encephalopathy  DD incl HCAP , less likely  PJP or other opportunistic infections. Of course, he has a history of Velcade therapy & pulmonary infx could be related to drug toxicity ? Alternatively, pulmonary involvement of amyloidosis should also be considered.  BAL results are not yet available.  - ct high flow O2 -ct empiric ABs, pct less reliable in this setting - solumedrol 60 q 6h   Nephrotic syndrome/ Renal amyloid - hold lisinopril in acute setting  Acute encephalopathy -he has multiple defects in multiple vascular territories likely representing embolic phenomenon whether these are blander or infectious is not overt.  Cultures are pending and he is widely covered including coverage with Diflucan for potential cryptococcus.  The infectious disease service would like a LP as the presence of CNS cryptococcus would change their management however with his large frontal lesion I am hesitant to perform the procedure.  I will be discussing this with neurology later today.  His mental status remains quite poor he will also be obtaining an EEG in order to rule out active seizures and an echocardiogram looking for  an embolic source.  The patient is critically ill with multiple organ systems failure and requires high complexity decision making for assessment and support, frequent evaluation and titration of therapies, application of advanced monitoring technologies and extensive interpretation of multiple databases. Critical Care Time devoted to patient care services described in this note independent of APP/resident  time is 32 minutes.   Lars Masson, MD [place] Pul[facility] & Critical [facility] Pager [phone] If no response call [phone]      03/08/2017, 7:59 AM

## 2017-03-08 NOTE — Progress Notes (Signed)
[facility] for Infectious Disease   Reason for visit: Follow up on pneumonia, AMS  Interval History: remains intubated, not responsive, no new positive cultures; niece in law at bedside; no associated rash Poor prognosis expressed to family per Dr. Leonie Man  Physical Exam: Constitutional:  Vitals:   03/08/17 0900 03/08/17 1000  BP: 129/85 114/79  Pulse: 98 91  Resp: 16 16  Temp:    SpO2: 95% 96%   patient is unresponsive Eyes: anicteric HENT: +ET Respiratory: Normal respiratory effort; CTA B Cardiovascular: RRR  Review of Systems: Unable to be assessed due to mental status  Lab Results  Component Value Date   WBC 13.1 (H) 03/08/2017   HGB 10.5 (L) 03/08/2017   HCT 31.1 (L) 03/08/2017   MCV 96.3 03/08/2017   PLT 36 (L) 03/08/2017    Lab Results  Component Value Date   CREATININE 0.84 03/08/2017   BUN 27 (H) 03/08/2017   NA 137 03/08/2017   K 3.9 03/08/2017   CL 108 03/08/2017   CO2 20 (L) 03/08/2017    Lab Results  Component Value Date   ALT 31 03/08/2017   AST 61 (H) 03/08/2017   ALKPHOS 122 03/08/2017     Microbiology: Recent Results (from the past 240 hour(s))  Culture, blood (Routine x 2)     Status: None   Collection Time: 02/21/2017  6:38 PM  Result Value Ref Range Status   Specimen Description BLOOD RIGHT ANTECUBITAL  Final   Special Requests   Final    BOTTLES DRAWN AEROBIC AND ANAEROBIC Blood Culture adequate volume   Culture   Final    NO GROWTH 5 DAYS Performed at [facility], [address], [place], [place] [address]    Report Status 03/08/2017 FINAL  Final  Culture, blood (Routine x 2)     Status: None   Collection Time: 02/19/2017  6:38 PM  Result Value Ref Range Status   Specimen Description BLOOD RIGHT FOREARM  Final   Special Requests   Final    BOTTLES DRAWN AEROBIC AND ANAEROBIC Blood Culture adequate volume   Culture   Final    NO GROWTH 5 DAYS Performed at [facility], [place] [address] [place], [place]  [address]    Report Status 03/08/2017 FINAL  Final  MRSA PCR Screening     Status: None   Collection Time: 03/04/17  6:13 PM  Result Value Ref Range Status   MRSA by PCR NEGATIVE NEGATIVE Final    Comment:        The GeneXpert MRSA Assay (FDA approved for NASAL specimens only), is one component of a comprehensive MRSA colonization surveillance program. It is not intended to diagnose MRSA infection nor to guide or monitor treatment for MRSA infections.   Pneumocystis smear by DFA     Status: None   Collection Time: 03/05/17 12:47 PM  Result Value Ref Range Status   Specimen Source-PJSRC EXPECTORATED SPUTUM  Final   Pneumocystis jiroveci Ag NEGATIVE  Final    Comment: Performed at [facility]  Respiratory Panel by PCR     Status: None   Collection Time: 03/05/17  2:10 PM  Result Value Ref Range Status   Adenovirus NOT DETECTED NOT DETECTED Final   Coronavirus 229E NOT DETECTED NOT DETECTED Final   Coronavirus HKU1 NOT DETECTED NOT DETECTED Final   Coronavirus NL63 NOT DETECTED NOT DETECTED Final   Coronavirus OC43 NOT DETECTED NOT DETECTED Final  Metapneumovirus NOT DETECTED NOT DETECTED Final   Rhinovirus / Enterovirus NOT DETECTED NOT DETECTED Final   Influenza A NOT DETECTED NOT DETECTED Final   Influenza B NOT DETECTED NOT DETECTED Final   Parainfluenza Virus 1 NOT DETECTED NOT DETECTED Final   Parainfluenza Virus 2 NOT DETECTED NOT DETECTED Final   Parainfluenza Virus 3 NOT DETECTED NOT DETECTED Final   Parainfluenza Virus 4 NOT DETECTED NOT DETECTED Final   Respiratory Syncytial Virus NOT DETECTED NOT DETECTED Final   Bordetella pertussis NOT DETECTED NOT DETECTED Final   Chlamydophila pneumoniae NOT DETECTED NOT DETECTED Final   Mycoplasma pneumoniae NOT DETECTED NOT DETECTED Final  Culture, bal-quantitative     Status: None (Preliminary result)   Collection Time: 03/06/17  3:48 PM  Result Value Ref Range Status   Specimen Description BRONCHIAL  ALVEOLAR LAVAGE  Final   Special Requests NONE  Final   Gram Stain   Final    FEW WBC PRESENT,BOTH PMN AND MONONUCLEAR RARE GRAM POSITIVE COCCI IN CHAINS RARE GRAM NEGATIVE COCCOBACILLI    Culture   Final    CULTURE REINCUBATED FOR BETTER GROWTH Performed at [facility], [place] [address] [place], [place] [address]    Report Status PENDING  Incomplete  Pneumocystis smear by DFA     Status: None   Collection Time: 03/06/17  3:48 PM  Result Value Ref Range Status   Specimen Source-PJSRC BRONCHIAL ALVEOLAR LAVAGE  Final   Pneumocystis jiroveci Ag NEGATIVE  Final    Comment: Performed at [place]    Impression/Plan:  1. AMS - unclear etiology.  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  I discussed this with the nephew's wife and family is mainly considering palliative approach.  Do not want LP for diagnosis and treatment.  No changes from ID standpoint at this time pending further discussion.    2. Palliative care - I agree with palliative approach and agree that prognosis is poor.    3.  Pneumonia - no positive cultures to date.   ]  At this time, I will sign off but available if situation changes.  thanks

## 2017-03-08 NOTE — Progress Notes (Signed)
SLP Cancellation Note  Patient Details Name: Gregory Galvan MRN: [id number] DOB: 1940-12-06   Cancelled treatment:       Reason Eval/Treat Not Completed: Patient not medically ready Gabriel Rainwater [place], [place] [phone]   [place] 03/08/2017, 7:33 AM

## 2017-03-08 NOTE — Care Management Note (Signed)
Case Management Note  Patient Details  Name: Gregory Galvan MRN: [id number] Date of Birth: Jun 24, 1940  Subjective/Objective:   Pt admitted on 03/12/2017 to [facility] with bilateral PNA.  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                    Action/Plan:   Pt currently remains intubated.  Will follow for discharge needs as pt progresses.    Expected Discharge Date:            Expected Discharge Plan:     In-House Referral:     Discharge planning Services  CM Consult  Post Acute Care Choice:    Choice offered to:     DME Arranged:    DME Agency:     HH Arranged:    HH Agency:     Status of Service:  In process, will continue to follow  If discussed at Long Length of Stay Meetings, dates discussed:    Additional Comments:  Ella Bodo, RN 03/08/2017, 4:37 PM

## 2017-03-08 NOTE — Progress Notes (Signed)
NEUROHOSPITALISTS STROKE TEAM - DAILY PROGRESS NOTE   ADMISSION HISTORY: Gregory Galvan is an 77 y.o. male, past medical history of hyperlipidemia, hypertension, BPH, hypothyroidism and renal amyloidosis receiving Velcade and dexamethasone who presented to [facility] long hospital with progressive shortness of breath and interstitial lung changes on chest x-ray on 02/27/2017   Patient currently intubated and unable to give history of present illness or review of systems.  History obtained from chart, patient had a cold and was treated outpatient with doxycycline which did not improve his symptoms, he was evaluated by his oncologist Dr. Irene Limbo and sent to the ER.  On admission at [facility],  patient was diagnosed with bilateral pneumonia and treated with BiPAP, overnight post ed and was placed on nonrebreather mask.  Per his  family at bedside, since January of this year, patient started experiencing hearing loss which progressively got worse this week.  They state the patient was not confused but was very hard and of hearing.  Patient experienced a fall overnight on 1/21, CT of the head and x-ray of the shoulders at that time was negative.  Patient progressively continues to be somnolent and not easily arousable unresponsive to stimulus.  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    Patient continued to be somnolent with progressive worsening respiratory failure and has since been intubated and transferred to [facility].  Family bedside patient did not have a history of strokes or neurological problems.  He was ambulatory and independent.  Currently we are unable to assess his deficits secondary to being intubed and sedated.  LSN: unknown tPA Given: No: Did not meet time criteria.  SUBJECTIVE (INTERVAL HISTORY) His partner  for 50 years is at the bedside. Patient is found laying in bed in NAD. Remains intubated and sedated. No new/acute events reported overnight.His son had come y`day but does not want to make decisions and partner feels more comfortable to do so   OBJECTIVE Lab Results: CBC:  Recent Labs  Lab 03/05/17 0620 03/06/17 1142 03/08/17 0304  WBC 9.7 14.6* 13.1*  HGB 11.1* 11.1* 10.5*  HCT 33.5* 33.2* 31.1*  MCV 96.3 96.0 96.3  PLT 72* 46* 36*   BMP: Recent Labs  Lab 03/16/2017 1833 03/04/17 0043 03/04/17 0500 03/05/17 0620 03/06/17 1142 03/08/17 0304  NA 133*  --  134* 135 135 137  K 4.7  --  4.4 3.9 4.2 3.9  CL 101  --  107 107 106 108  CO2 22  --  22 24 23  20*  GLUCOSE 140*  --  152* 89 134* 135*  BUN 24*  --  21* 21* 19 27*  CREATININE 1.07 0.94 0.90 0.73 0.84 0.84  CALCIUM 9.0  --  8.0* 8.1* 8.2* 8.0*   Liver Function Tests:  Recent Labs  Lab 03/02/2017 1833 03/06/17 1142 03/08/17 0304  AST 71* 61* 61*  ALT 42 32 31  ALKPHOS 114 125 122  BILITOT <0.1* 0.6 0.8  PROT 6.1* 4.7* 4.0*  ALBUMIN 2.6* 2.1* 2.0*   PHYSICAL EXAM Temp:  [97.8 F (36.6 C)-98.8 F (37.1 C)] 98.2 F (36.8 C) (01/23 1200) Pulse Rate:  [80-110] 96 (01/23 1300) Resp:  [0-32] 16 (01/23 1300) BP: (114-138)/(79-102) 125/83 (01/23 1300) SpO2:  [93 %-100 %] 94 % (01/23 1300) FiO2 (%):  [40 %] 40 % (01/23 1138) Weight:  [174 lb 9.7 oz (79.2 kg)] 174 lb 9.7  oz (79.2 kg) (01/23 0500) General - Well nourished, intubate and sedated, appears critically ill HEENT-  Normocephalic, Cardiovascular - Regular rate and rhythm  Respiratory - No wheezing. Abdomen - soft and non-tender, Extremities- no edema or cyanosis Neurological Examination Mental Status: Remains intubated and sedated. Does not open eyes to command and following commands. Cranial Nerves: II: Discs flat bilaterally; Visual fields grossly normal, bilateral conjunctival injection III,IV, VI: Pupils equal and reactive to light and accommodation  but sluggish, negative doll's eyes minimal corneal reflex on the left and none on the right. V,VII: Face appears symmetric but unable to accurately assess secondary to intubation tubes  Motor: No  voluntary motor movements even with strong noxious stimulus Tone and bulk:normal tone throughout; no atrophy noted Sensory: No reaction to harsh even while off sedation.   Deep Tendon Reflexes and Plantars: Mute Cerebellar: Unable to assess due to sedation Gait: Unable to assess  IMAGING: I have personally reviewed the radiological images below and agree with the radiology interpretations. Ct Head Wo Contrast Result Date: 03/07/2017 IMPRESSION: Multifocal acute infarct without hemorrhagic conversion or visible progression compared to brain MRI earlier today. Electronically Signed   By: Monte Fantasia M.D.   On: 03/07/2017 12:52   Ct Head Wo Contrast Result Date: 03/06/2017 IMPRESSION: Acute right posterior frontal-parietal nonhemorrhagic MCA distribution infarct superimposed on chronic microvascular ischemia and chronic bilateral basal ganglial lacunar infarcts. These results will be called to the ordering clinician or representative by the Radiologist Assistant, and communication documented in the PACS or zVision Dashboard. Electronically Signed   By: Ashley Royalty M.D.   On: 03/06/2017 13:47   Ct Head Wo Contrast Result Date: 03/05/2017  IMPRESSION: Chronic ischemic microangiopathy and old basal ganglia lacunar infarcts without acute intracranial abnormality. Electronically Signed   By: Ulyses Jarred M.D.   On: 03/05/2017 22:35   Mr Jodene Nam Brain/Head/Neck JO Contrast Result Date: 03/07/2017 IMPRESSION: 1. Multiple large areas of acute ischemia within both cerebral hemispheres with multiple small foci of acute ischemic infarction within both cerebellar hemispheres. The largest areas of abnormality are in the MCA distributions, but both PCA distributions are also involved. There is sparing of the anterior  cerebral artery territory. Given the involvement of multiple vascular territories and the lack of abnormal vascular findings, acute hypoxic ischemic injury is the most probable explanation. 2. Normal MRA of the head and neck. This suggests that showering emboli are an unlikely explanation, though echocardiography may be helpful to exclude an embolic source within the patient's heart. 3. No acute hemorrhage or mass effect. 4. No cerebral amyloid angiopathy. Minimal chronic microvascular disease for age. Electronically Signed   By: Ulyses Jarred M.D.   On: 03/07/2017 03:31   Echocardiogram:                                              PENDING EEG:                                                                   PENDING    IMPRESSION: Gregory Galvan is a 77 y.o. male with PMH of hyperlipidemia, hypertension, BPH, hypothyroidism  and renal amyloidosis receiving Velcade and dexamethasone who presented to [facility] long hospital with progressive shortness of breath and bilateral pneumonia.  Patient explains a fall while in hospital, initial CT post fall was normal.  Patient progressively became somnolent and not easily arousable, repeat CT of the head revealed acute right posterior frontoparietal MCA stroke.  Patient was transferred at [facility] due to persistent somnolence, altered mental status and high risk for respiratory failure patient was intubated. Not a candidate for tPA due to out of window.  Multiple large areas of acute ischemia within both cerebral hemispheres with multiple small foci of acute ischemic infarction within both cerebellar hemispheres.   Suspected Etiology: Likely cardioembolic source  Patient too sick to do TEE and findings unlikely to change management or outcome Resultant Symptoms: bilateral weakness, Aphasia, Encephalopathic  Stroke Risk Factors: diabetes mellitus, hyperlipidemia and hypertension Other Stroke Risk Factors: Advanced age, Hx stroke, Hx renal  amyloidosis   Outstanding Stroke Work-up Studies:     Echocardiogram:                                                    PENDING EEG:                                                                      PENDING  03/08/2017 ASSESSMENT:   Neuro exam unchanged, encephlopathic. Patient remains intubated, sedated and critically ill. Will obtain EEG to rule out seizure activity. Will eventually need TEE once medically stable to rule out embolic source of strokes. Resume ASA and Statin once medically stable. Repeat CT Head without hemorraghic conversion/edema.  PLAN  03/08/2017: Continue Aspirin/ Statin once patient medically stable Frequent neuro checks Telemetry monitoring PT/OT/SLP Consult PM & Rehab Will likely need TEE and/or Loop Recorder Placement once medically stable Ongoing aggressive stroke risk factor management Patient's family will be counseled to be compliant with his antithrombotic medications Patient's family will be counseled on Lifestyle modifications including, Diet, Exercise, and Stress Follow up with [facility] in 6 weeks  HX OF STROKES: old basal ganglia lacunar infarcts  DYSPHAGIA: NPO until passes SLP swallow evaluation Aspiration Precautions in progress  R/O AFIB: Will likely need TEE and/or Loop Recorder Placement once medically stable but patient too sick at present for invasive tests  R/O SEIZURES: EEG- abnormal due to moderate diffuse background slowing. No seizure activity reported overnight Maintain Seizure precautions  Multiple MEDICAL ISSUES: Per CCM Management  HYPERTENSION: Stable Avoid Hypotension and Dehydration Permissive hypertension (OK if <220/120) for 24-48 hours post stroke and then gradually normalized within 5-7 days. Nicardipine drip, Labetolol PRN Long term BP goal normotensive. May slowly restart home B/P medications after 48 hours Home Meds: Lisinopril  HYPERLIPIDEMIA:    Component Value Date/Time   CHOL 154  03/07/2017 0456   TRIG 128 03/07/2017 0456   HDL 58 03/07/2017 0456   CHOLHDL 2.7 03/07/2017 0456   VLDL 26 03/07/2017 0456   LDLCALC 70 03/07/2017 0456  Home Meds:  Zetia 10 mg LDL  goal < 70 Continue Zetia at discharge, if appropriate  PRE-DIABETES:  Lab Results  Component Value Date   HGBA1C 5.9 (H) 03/07/2017  HgbA1c goal < 7.0 Currently on: Novolog Continue CBG monitoring and SSI to maintain glucose 140-180 mg/dl DM education   Other Active Problems: Active Problems:   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CC)   Sepsis due to pneumonia ([place])   HCAP (healthcare-associated pneumonia)   Immunocompromised state ([place])   Acute hypoxemic respiratory failure ([place])   Acute encephalopathy    Hospital day # 5 VTE prophylaxis: SCD's  Diet : Diet NPO time specified Fall precautions   FAMILY UPDATES: No family at bedside  TEAM UPDATES: Rigoberto Noel, MD   Prior Home Stroke Medications:  No antithrombotic  Discharge Stroke Meds:  Please discharge patient on No antithrombotic at this time  Disposition: Final discharge disposition not confirmed Therapy Recs:               PENDING Follow Up:  Follow-up Information    Garvin Fila, MD. Schedule an appointment as soon as possible for a visit in 6 week(s).   Specialties:  Neurology, Radiology Contact information: [address] [place] [address] [phone]          Unk Pinto, MD -PCP Follow up in 1-2 weeks         I have personally examined this patient, reviewed notes, independently viewed imaging studies, participated in medical decision making and plan of care.ROS completed by me personally and pertinent positives fully documented  I have made any additions or clarifications directly to the above note. Agree with note above.  Patient was admitted with subacute respiratory failure and altered mental status after admission and MRI scan shows multifocal bilateral hemispheric as well as cerebellar embolic infarcts-  etiology to be determined. His neurological exam and general medical status is quite poor and his prognosis for surviving without major deficits and making independent living quite slim. I had long discussion at the bedside with the patient's  partner and expended poor prognosis and answered questions. Discussed with Dr. Pearline Cables. Consider palliative care consult. LP for diagnosing cryptococcal meningitis may change duration of treatment but unlikely to alter prognosis This patient is critically ill and at significant risk of neurological worsening, death and care requires constant monitoring of vital signs, hemodynamics,respiratory and cardiac monitoring, extensive review of multiple databases, frequent neurological assessment, discussion with family, other specialists and medical decision making of high complexity.I have made any additions or clarifications directly to the above note.This critical care time does not reflect procedure time, or teaching time or supervisory time of PA/NP/Med Resident etc but could involve care discussion time.  I spent 35 minutes of neurocritical care time  in the care of  this patient. Agree with palliative care approach  Stroke team will sign off. Call for questions.   Antony Contras, MD Medical Director [facility] Pager: [phone] 03/08/2017 1:44 PM  To contact Stroke Continuity provider, please refer to [email or web address] After hours, contact General Neurology

## 2017-03-09 ENCOUNTER — Inpatient Hospital Stay (HOSPITAL_COMMUNITY): Payer: Medicare Other

## 2017-03-09 ENCOUNTER — Encounter (HOSPITAL_COMMUNITY): Payer: Self-pay

## 2017-03-09 LAB — BLOOD GAS, ARTERIAL
Acid-Base Excess: 0.9 mmol/L (ref 0.0–2.0)
BICARBONATE: 24.2 mmol/L (ref 20.0–28.0)
Drawn by: 39898
FIO2: 40
LHR: 18 {breaths}/min
MECHVT: 550 mL
O2 SAT: 95.1 %
PATIENT TEMPERATURE: 98.5
PEEP/CPAP: 8 cmH2O
PH ART: 7.477 — AB (ref 7.350–7.450)
PO2 ART: 76.5 mmHg — AB (ref 83.0–108.0)
pCO2 arterial: 33 mmHg (ref 32.0–48.0)

## 2017-03-09 LAB — CBC WITH DIFFERENTIAL/PLATELET
BASOS PCT: 0 %
Basophils Absolute: 0 10*3/uL (ref 0.0–0.1)
Eosinophils Absolute: 0 10*3/uL (ref 0.0–0.7)
Eosinophils Relative: 0 %
HEMATOCRIT: 30.8 % — AB (ref 39.0–52.0)
HEMOGLOBIN: 10.5 g/dL — AB (ref 13.0–17.0)
Lymphocytes Relative: 2 %
Lymphs Abs: 0.2 10*3/uL — ABNORMAL LOW (ref 0.7–4.0)
MCH: 32.8 pg (ref 26.0–34.0)
MCHC: 34.1 g/dL (ref 30.0–36.0)
MCV: 96.3 fL (ref 78.0–100.0)
Monocytes Absolute: 0.3 10*3/uL (ref 0.1–1.0)
Monocytes Relative: 3 %
NEUTROS ABS: 10 10*3/uL — AB (ref 1.7–7.7)
NEUTROS PCT: 95 %
Platelets: 49 10*3/uL — ABNORMAL LOW (ref 150–400)
RBC: 3.2 MIL/uL — ABNORMAL LOW (ref 4.22–5.81)
RDW: 14.4 % (ref 11.5–15.5)
WBC: 10.5 10*3/uL (ref 4.0–10.5)

## 2017-03-09 LAB — COMPREHENSIVE METABOLIC PANEL
ALK PHOS: 126 U/L (ref 38–126)
ALT: 44 U/L (ref 17–63)
ANION GAP: 8 (ref 5–15)
AST: 60 U/L — ABNORMAL HIGH (ref 15–41)
Albumin: 1.9 g/dL — ABNORMAL LOW (ref 3.5–5.0)
BUN: 34 mg/dL — ABNORMAL HIGH (ref 6–20)
CALCIUM: 8 mg/dL — AB (ref 8.9–10.3)
CO2: 22 mmol/L (ref 22–32)
Chloride: 109 mmol/L (ref 101–111)
Creatinine, Ser: 0.72 mg/dL (ref 0.61–1.24)
GFR calc Af Amer: >60 mL/min (ref >60)
GFR calc non Af Amer: >60 mL/min (ref >60)
Glucose, Bld: 216 mg/dL — ABNORMAL HIGH (ref 65–99)
POTASSIUM: 3.7 mmol/L (ref 3.5–5.1)
SODIUM: 139 mmol/L (ref 135–145)
TOTAL PROTEIN: 4 g/dL — AB (ref 6.5–8.1)
Total Bilirubin: 0.7 mg/dL (ref 0.3–1.2)

## 2017-03-09 LAB — MAGNESIUM
Magnesium: 2.3 mg/dL (ref 1.7–2.4)
Magnesium: 2.2 mg/dL (ref 1.7–2.4)

## 2017-03-09 LAB — GLUCOSE, CAPILLARY
GLUCOSE-CAPILLARY: 234 mg/dL — AB (ref 65–99)
GLUCOSE-CAPILLARY: 191 mg/dL — AB (ref 65–99)
GLUCOSE-CAPILLARY: 186 mg/dL — AB (ref 65–99)
Glucose-Capillary: 190 mg/dL — ABNORMAL HIGH (ref 65–99)
Glucose-Capillary: 201 mg/dL — ABNORMAL HIGH (ref 65–99)
Glucose-Capillary: 208 mg/dL — ABNORMAL HIGH (ref 65–99)

## 2017-03-09 LAB — CULTURE, BAL-QUANTITATIVE W GRAM STAIN

## 2017-03-09 LAB — PHOSPHORUS
PHOSPHORUS: 2.1 mg/dL — AB (ref 2.5–4.6)
Phosphorus: 2.5 mg/dL (ref 2.5–4.6)

## 2017-03-09 LAB — CULTURE, BAL-QUANTITATIVE: CULTURE: NORMAL — AB

## 2017-03-09 LAB — SEDIMENTATION RATE: Sed Rate: 2 mm/hr (ref 0–16)

## 2017-03-09 LAB — PROCALCITONIN

## 2017-03-09 NOTE — Progress Notes (Signed)
OT Cancellation Note  Patient Details Name: Gregory Galvan MRN: [id number] DOB: 1940-09-06   Cancelled Treatment:    Reason Eval/Treat Not Completed: Medical issues which prohibited therapy.  Pt currently intubated, not following commands, and awaiting palliative consult for GOC.  [name], OTR/L [phone]   Lucille Passy M 03/09/2017, 12:16 PM

## 2017-03-09 NOTE — Progress Notes (Signed)
PT Cancellation Note  Patient Details Name: Gregory Galvan MRN: [id number] DOB: 1941-01-26   Cancelled Treatment:    Reason Eval/Treat Not Completed: Medical issues which prohibited therapy(pt on vent, not following commands and await palliative consult for POC)   Stan Cantave B Arnell Mausolf 03/09/2017, 8:02 AM  Elwyn Reach, [place]

## 2017-03-09 NOTE — Progress Notes (Signed)
[facility] Pulmonary Diseases & Critical Care Medicine Initial Pulmonary/Critical Care Consultation  Patient Name: Gregory Galvan MRN: [id number] DOB: 10/12/1940    ADMISSION DATE:  02/25/2017 CONSULTATION DATE:  03/05/2017  REFERRING MD:  Dr. Landis Gandy  REASON FOR CONSULTATION:  hypoxia   HISTORY OF PRESENT ILLNESS  This 77 y.o. Caucasian male is seen in consultation at the request of Dr. Landis Gandy for recommendations on further evaluation and management of acute hypoxic respiratory failure. The patient was admitted after failed outpatient treatment for "pneumonia". He has BL infx & rapidly increased in supplemental oxygen requirement.  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  His mental status deteriorated 1/21, and he developed anisocoria and a CT scan of the head was obtained which showed a frontal lesion.  Subsequently an MRI was performed which shows multiple defects in many vascular territories.  He has required intubation both for airway protection and oxygenation on 1/21.  A BAL was performed on 1/21.  There are still no significant results back from the BAL yet.  He had a cryptococcus antigen earlier in his hospital course which was positive.  He had a HIV titer performed on 10/08/2019 which was negative.  He remains on no sedation and is not at all interactive.   He is requiring only 40% oxygen this morning and is maintaining his saturations on 5 of PEEP.  He is tolerating a pressure support of only 8.  ANTIBIOTICS: Cefepime Clindamycin Diflucan  SIGNIFICANT EVENTS: 1/20 >> transferred to stepdown unit for increasing O2 requirement 1/21 confused -fall overnight - head CT, XR shoulder neg  LINES/TUBES: -   SUBJ Afebrile Remains on high flow O2 COnfused, fall overnight, sedated with ativan   VITAL SIGNS: BP 121/73   Pulse 89   Temp 98.7 F (37.1 C) (Axillary)   Resp 16   Ht _0  (1.753 m)   Wt  173 lb 1 oz (78.5 kg)   SpO2 94%   BMI 25.56 kg/m   HEMODYNAMICS:    VENTILATOR SETTINGS: Vent Mode: PSV;CPAP FiO2 (%):  [40 %] 40 % Set Rate:  [18 bmp] 18 bmp Vt Set:  [550 mL] 550 mL PEEP:  [8 cmH20-10 cmH20] 8 cmH20 Pressure Support:  [8 cmH20-10 cmH20] 8 cmH20 Plateau Pressure:  [18 cmH20] 18 cmH20  INTAKE / OUTPUT: I/O last 3 completed shifts: In: 4268.3 [I.V.:2775; NG/GT:593.3; IV Piggyback:900] Out: 4235 [Urine:1470]  PHYSICAL EXAMINATION: General: Orally intubated and not at all interactive despite being on no sedation.   Throat/Oral Cavity: Normal dentition. No oral thrush. No exudate. Mucous membranes are moist. No tonsillar enlargement. Neck: supple, no thyromegaly, no JVD, no lymphadenopathy. Trachea midline. Chest/Lung: symmetric in development and expansion. Good air entry.  BL scattered insp  rhonchi  Heart: Regular S1 and S2 without murmur, or rub there is an intermittent gallop.  Abdomen: soft, nontender, nondistended. Normoactive bowel sounds. n rebound. No guarding. Extremities: There are raised purpuric lesions on both forearms.   Lymphatic: no cervical/axiallary/inguinal lymph nodes appreciated NEURO: Does not eye open to me today for either voice or sternal rub.  Pupils are equal.  There is minimal movement of the right upper extremity to noxious stimuli only.  EOMs appear to be intact to doll's eyes pupils are currently equal.  He has bilateral sub-conjunctival injections.  LABS:  BMET Recent Labs  Lab 03/06/17 1142 03/08/17 0304 03/09/17 0454  NA 135 137 139  K 4.2 3.9 3.7  CL 106  108 109  CO2 23 20* 22  BUN 19 27* 34*  CREATININE 0.84 0.84 0.72  GLUCOSE 134* 135* 216*    Electrolytes Recent Labs  Lab 03/06/17 1142 03/08/17 0304 03/08/17 1609 03/09/17 0454  CALCIUM 8.2* 8.0*  --  8.0*  MG  --   --  2.3 2.3  PHOS  --   --  2.7 2.5    CBC Recent Labs  Lab 03/06/17 1142 03/08/17 0304 03/09/17 0454  WBC 14.6* 13.1* 10.5  HGB  11.1* 10.5* 10.5*  HCT 33.2* 31.1* 30.8*  PLT 46* 36* 49*    Coag's Recent Labs  Lab 03/05/2017 1833 03/08/17 0304  INR 1.08 1.56    Sepsis Markers Recent Labs  Lab 03/02/2017 1854 02/15/2017 2048  03/07/17 1052 03/08/17 0304 03/09/17 0454  LATICACIDVEN 3.39* 1.48  --  1.4  --   --   PROCALCITON  --   --    < > 0.18 0.15 <0.10   < > = values in this interval not displayed.    ABG Recent Labs  Lab 03/06/17 0334 03/06/17 1751 03/09/17 0407  PHART 7.463* 7.371 7.477*  PCO2ART 32.2 39.8 33.0  PO2ART 64.6* 202.0* 76.5*    Liver Enzymes Recent Labs  Lab 03/06/17 1142 03/08/17 0304 03/09/17 0454  AST 61* 61* 60*  ALT 32 31 44  ALKPHOS 125 122 126  BILITOT 0.6 0.8 0.7  ALBUMIN 2.1* 2.0* 1.9*    Cardiac Enzymes No results for input(s): TROPONINI, PROBNP in the last 168 hours.  Glucose Recent Labs  Lab 03/08/17 0728 03/08/17 1141 03/08/17 1722 03/08/17 2003 03/09/17 0342 03/09/17 0803  GLUCAP 125* 130* 146* 161* 190* 201*    Imaging No results found.     ASSESSMENT / PLAN: Active Problems:   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CC)   Sepsis due to pneumonia ([place])   HCAP (healthcare-associated pneumonia)   Immunocompromised state ([place])   Acute hypoxemic respiratory failure ([place])   Acute encephalopathy  DD incl HCAP , less likely  PJP or other opportunistic infections. Of course, he has a history of Velcade therapy & pulmonary infx could be related to drug toxicity ? Alternatively, pulmonary involvement of amyloidosis should also be considered.  BAL results are not yet available. Interestingly with empiric therapy his gas exchange has improved and he is tolerating 5 of PEEP and 40% this morning.  If not for his altered mental status the patient could be extubated. - solumedrol 60 q 6h   Nephrotic syndrome/ Renal amyloid - hold lisinopril in acute setting  Acute encephalopathy -he has multiple defects in multiple vascular territories likely representing embolic  phenomenon whether these are blander or infectious is not overt.  Cultures are pending and he is widely covered including coverage with Diflucan for potential cryptococcus.  His partner and friends are divided on whether or not an LP should be performed to determine whether or not the defects are infectious or multiple bland strokes.  I have suggested that as his exam is worse today a repeat CT scan may be helpful to them in making a decision on whether to continue with any sort of supportive care. Fortunately the next of kin is the patient's younger brother who does not wish to make any decisions for the patient.  We are attempting to have surrogacy transferred to the patient's partner.   Greater than 32 minutes was spent in the care of this patient today  Lars Masson, MD [facility] Pager [phone] If  no response call [phone]      03/09/2017, 10:33 AM

## 2017-03-09 NOTE — Progress Notes (Signed)
Palliative Consult received re: surrogate decision making. I have reviewed the chart in detail and based on documentation there is no ACP or HCPOA therefore we should follow [name] NOK/Surrogate laws- and must make reasonable attempts to locate these people Since his brother is the identified NOK and is deferring to patient's partner and there are no other NOK we should allow his partner to make decisions and there is no need for any kind of legal documentation other than verbal conversation and documented attempts to contact other NOK by CSW. Patient is unable to establish a legal surrogate decision maker at this point. I support medical consensus that [name] has significant neurological damage that is irreversible-and he will not survive without major deficits including permanent inability to communicate or interact with his environment or maintain his basic biological functions without artificial interventions. A transition to full comfort care is a medically reasonable and sound decision and my strong recommendation given his entire clinical picture and even without knowledge of what his preferences would be in this situation. I will connect with his partner for further discussion about goals of care.  Gregory Hacker, DO Palliative Medicine

## 2017-03-10 LAB — BASIC METABOLIC PANEL
Anion gap: 9 (ref 5–15)
BUN: 47 mg/dL — AB (ref 6–20)
CHLORIDE: 108 mmol/L (ref 101–111)
CO2: 22 mmol/L (ref 22–32)
Calcium: 8 mg/dL — ABNORMAL LOW (ref 8.9–10.3)
Creatinine, Ser: 0.71 mg/dL (ref 0.61–1.24)
GFR calc Af Amer: >60 mL/min (ref >60)
GFR calc non Af Amer: >60 mL/min (ref >60)
GLUCOSE: 212 mg/dL — AB (ref 65–99)
POTASSIUM: 3.9 mmol/L (ref 3.5–5.1)
Sodium: 139 mmol/L (ref 135–145)

## 2017-03-10 LAB — CBC WITH DIFFERENTIAL/PLATELET
Basophils Absolute: 0 10*3/uL (ref 0.0–0.1)
Basophils Relative: 0 %
Eosinophils Absolute: 0 10*3/uL (ref 0.0–0.7)
Eosinophils Relative: 0 %
HCT: 31 % — ABNORMAL LOW (ref 39.0–52.0)
HEMOGLOBIN: 10.3 g/dL — AB (ref 13.0–17.0)
LYMPHS ABS: 0.3 10*3/uL — AB (ref 0.7–4.0)
LYMPHS PCT: 3 %
MCH: 31.9 pg (ref 26.0–34.0)
MCHC: 33.2 g/dL (ref 30.0–36.0)
MCV: 96 fL (ref 78.0–100.0)
Monocytes Absolute: 0.1 10*3/uL (ref 0.1–1.0)
Monocytes Relative: 1 %
NEUTROS PCT: 96 %
Neutro Abs: 11.3 10*3/uL — ABNORMAL HIGH (ref 1.7–7.7)
Platelets: 61 10*3/uL — ABNORMAL LOW (ref 150–400)
RBC: 3.23 MIL/uL — AB (ref 4.22–5.81)
RDW: 14.2 % (ref 11.5–15.5)
WBC: 11.8 10*3/uL — ABNORMAL HIGH (ref 4.0–10.5)

## 2017-03-10 LAB — PROTIME-INR
INR: 1.54
Prothrombin Time: 18.4 seconds — ABNORMAL HIGH (ref 11.4–15.2)

## 2017-03-10 LAB — PHOSPHORUS: Phosphorus: 2.2 mg/dL — ABNORMAL LOW (ref 2.5–4.6)

## 2017-03-10 LAB — MAGNESIUM: MAGNESIUM: 2.2 mg/dL (ref 1.7–2.4)

## 2017-03-10 LAB — PNEUMOCYSTIS PCR: Result Pneumocystis PCR: UNDETERMINED

## 2017-03-10 LAB — GLUCOSE, CAPILLARY
Glucose-Capillary: 215 mg/dL — ABNORMAL HIGH (ref 65–99)
Glucose-Capillary: 182 mg/dL — ABNORMAL HIGH (ref 65–99)

## 2017-03-10 MED ORDER — GLYCOPYRROLATE 0.2 MG/ML IJ SOLN: 0.2000 mg | INTRAMUSCULAR | Status: DC | PRN

## 2017-03-10 MED ORDER — MORPHINE BOLUS VIA INFUSION
5.0000 mg | INTRAVENOUS | Status: DC | PRN
  Filled 2017-03-10: qty 5

## 2017-03-10 MED ORDER — BIOTENE DRY MOUTH MT LIQD: 15.0000 mL | OROMUCOSAL | Status: DC | PRN

## 2017-03-10 MED ORDER — LORAZEPAM 2 MG/ML IJ SOLN
2.0000 mg | INTRAMUSCULAR | Status: DC
  Administered 2017-03-10: 2 mg via INTRAVENOUS
  Filled 2017-03-10: qty 1

## 2017-03-10 MED ORDER — MIDAZOLAM BOLUS VIA INFUSION
2.0000 mg | INTRAVENOUS | Status: DC | PRN
  Filled 2017-03-10: qty 2

## 2017-03-10 MED ORDER — SODIUM CHLORIDE 0.9 % IV SOLN
1.0000 mg/h | INTRAVENOUS | Status: DC
  Administered 2017-03-10: 1 mg/h via INTRAVENOUS
  Filled 2017-03-10: qty 10

## 2017-03-10 MED ORDER — GLYCOPYRROLATE 1 MG PO TABS
1.0000 mg | ORAL_TABLET | ORAL | Status: DC | PRN
  Filled 2017-03-10: qty 1

## 2017-03-10 MED ORDER — ACETAMINOPHEN 325 MG PO TABS: 650.0000 mg | ORAL_TABLET | Freq: Four times a day (QID) | ORAL | Status: DC | PRN

## 2017-03-10 MED ORDER — ACETAMINOPHEN 650 MG RE SUPP: 650.0000 mg | Freq: Four times a day (QID) | RECTAL | Status: DC | PRN

## 2017-03-10 MED ORDER — POLYVINYL ALCOHOL 1.4 % OP SOLN
1.0000 [drp] | Freq: Four times a day (QID) | OPHTHALMIC | Status: DC | PRN
  Filled 2017-03-10: qty 15

## 2017-03-10 MED ORDER — SODIUM CHLORIDE 0.9 % IV SOLN
INTRAVENOUS | Status: DC
  Administered 2017-03-10: 12:00:00 via INTRAVENOUS

## 2017-03-10 MED ORDER — SODIUM CHLORIDE 0.9 % IV SOLN
4.0000 mg/h | INTRAVENOUS | Status: DC
  Administered 2017-03-10: 3 mg/h via INTRAVENOUS
  Filled 2017-03-10: qty 10

## 2017-03-10 NOTE — Care Management Note (Signed)
Case Management Note  Patient Details  Name: Gregory Galvan MRN: [id number] Date of Birth: July 26, 1940  Subjective/Objective:   Pt admitted on 02/25/2017 to [facility] with bilateral PNA.  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                    Action/Plan:   Pt currently remains intubated.  Will follow for discharge needs as pt progresses.    Expected Discharge Date:            Expected Discharge Plan:     In-House Referral:  Chaplain, Hospice / Palliative Care  Discharge planning Services  CM Consult  Post Acute Care Choice:    Choice offered to:     DME Arranged:    DME Agency:     HH Arranged:    HH Agency:     Status of Service:  Completed, signed off  If discussed at [employer] of Stay Meetings, dates discussed:    Additional Comments:  03/10/17 J. Zakiya Sporrer, RN, BSN  Pt's partner of over 44 years has decided to offer comfort measures.  Pt extubated earlier today with Palliative Medicine Team support.    Ella Bodo, RN 03/10/2017, 4:32 PM

## 2017-03-10 NOTE — Progress Notes (Signed)
I had extensive conversation with Gregory Galvan last PM re: patient's goals of care. I went over all of the medical information in support of a poor prognosis. I will meet him today at 10AM at which point we plan to transition to full comfort care. I will support him through that process.  [name], DO Palliative Medicine

## 2017-03-10 NOTE — Progress Notes (Signed)
Received patient from ICU, patient is unresponsive , comfort care only. On Morphine and versed drip. Friend at bedside, oriented to unit and staff. Will continue to monitor.

## 2017-03-10 NOTE — Progress Notes (Signed)
Chaplain    03/10/17 1600  Clinical Encounter Type  Visited With Patient and family together  Visit Type Patient actively dying  Referral From Nurse  Consult/Referral To Chaplain  Spiritual Encounters  Spiritual Needs Prayer;Grief support;Emotional  Chaplain visited with the PT and Family.  The PT is breathing on his own with no oxygen assistance.  The Family talked with the Chaplain about the [place] and the pray no not weigh our merits but pardon our offenses.

## 2017-03-10 NOTE — Procedures (Signed)
Extubation Procedure Note  Patient Details:   Name: Gregory Galvan DOB: Jun 04, 1940 MRN: [id number]   Airway Documentation:  Airway 7.5 mm (Active)  Secured at (cm) 26 cm 03/10/2017  8:56 AM  Measured From Lips 03/10/2017  8:56 AM  [place] 03/10/2017  8:56 AM  Secured By [employer] 03/10/2017  8:56 AM  Tube Holder Repositioned Yes 03/10/2017  8:56 AM  Cuff Pressure (cm H2O) 24 cm H2O 03/10/2017  3:30 AM  Site Condition Dry 03/10/2017  8:56 AM    Evaluation  O2 sats: stable throughout Complications: No apparent complications Patient did tolerate procedure well. Bilateral Breath Sounds: Clear, Diminished   Patient was extubated per MD order. Patient was extubated and then placed on 4 lpm for comfort. He was stable throughout the procedure and had a positive leak test. Breath sounds are diminished and clear and no stridor present. He is currently maintaining on his nasal cannula. His RN and family were at bedside and continue to be post extubation. Vitals post extubation are: RR-20, Breath sounds- Diminished and Clear, Spo2-90%, Bp- 111/81, Hr-91.   Melina Schools 03/10/2017, 11:40 AM

## 2017-03-10 NOTE — Consult Note (Signed)
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 I discussed his prognosis with Gregory Galvan his partner for over 66 years. Today they have decided that comfort care is the most compassionate way to care for him given his overall poor prognosis. I assisted with a compassionate extubation and have adjusted his medications and orders to reflect a comfort care approach. I anticipate that he will decline quickly <48 hours- I prepared family for all possible trajectories and provided support at the bedside.   Gregory Hacker, DO Palliative Medicine  TIME: 54 MIN Greater than 50%  of this time was spent counseling and coordinating care related to the above assessment and plan.

## 2017-03-10 NOTE — Progress Notes (Signed)
Pharmacy Antibiotic Note  Gregory Galvan is a 77 y.o. male admitted on 03/01/2017 with pneumonia.  Pharmacy has been consulted for Vancomycin & Cefepime dosing - day #8. ID concerned for crypto meningitis but no LP due to family preference and continuing current care at this time.  SCr stable. Afebrile, WBC up 11.8 (IV steroids).  Plan: Continue Cefepime 1gm IV q8h Continue vancomycin 1g IV q12h - d/c soon? Monitor renal function, cx data, f/u GOC/LOT, vancomycin trough at new steady state if continuing   Height: _0  (175.3 cm) Weight: 174 lb 2.6 oz (79 kg) IBW/kg (Calculated) : 70.7  Temp (24hrs), Avg:98.5 F (36.9 C), Min:97.8 F (36.6 C), Max:98.9 F (37.2 C)  Recent Labs  Lab 03/06/2017 1854 03/06/2017 2048  03/05/17 0620 03/06/17 1142 03/07/17 1052 03/07/17 1832 03/08/17 0304 03/09/17 0454 03/10/17 0453  WBC  --   --    < > 9.7 14.6*  --   --  13.1* 10.5 11.8*  CREATININE  --   --    < > 0.73 0.84  --   --  0.84 0.72 0.71  LATICACIDVEN 3.39* 1.48  --   --   --  1.4  --   --   --   --   VANCOTROUGH  --   --   --   --   --   --  11*  --   --   --    < > = values in this interval not displayed.    Estimated Creatinine Clearance: 81 mL/min (by C-G formula based on SCr of 0.71 mg/dL).    Allergies  Allergen Reactions  . 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 Sulfonamide Derivatives Itching and Swelling  . Penicillins Swelling and Rash    Has patient had a PCN reaction causing immediate rash, facial/tongue/throat swelling, SOB or lightheadedness with hypotension: YES Has patient had a PCN reaction causing severe rash involving mucus membranes or skin necrosis:YES (PENILE SWELLING AND RASH) Has patient had a PCN reaction that required hospitalization:YES Has patient had a PCN reaction occurring within the last 10 years: nO If all of the above answers are "NO", then may proceed with Cephalosporin use.     Antimicrobials this  admission: 1/18 Vanc>> 1/18 Cefepime>> 1/21 Fluconazole >> 1/23 Acyclovir PO >>  Dose adjustments this admission: 1/22 Vanc trough = 11 on 1500 mg q 24 hrs, inc to 1g q 12 hrs  Microbiology results: 1/18 BCx: neg 1/18 MRSA PCR: negative 1/20 resp virus panel: neg, influenza panel: neg 1/20 sputum Pneumocytis smear: neg, PCR: IP (ID recs bronch for lower secretions if intubated) 1/20 Cryptococcal Ag: POSITIVE (per ID note, in an immunocompromised host this could represent cryptococcal pneumonia/pneumonitis) 1/21 strep pneumo/legionella - NEG 1/21 BAL - 30k col nf  Elicia Lamp, PharmD, BCPS Clinical Pharmacist Clinical phone for 03/10/2017 until 3:30pm: [phone] If after 3:30pm, please call main pharmacy at: [phone] 03/10/2017 8:47 AM

## 2017-03-10 NOTE — Progress Notes (Signed)
PT Cancellation Note/ discharge  Patient Details Name: Gregory Galvan MRN: [id number] DOB: May 13, 1940   Cancelled Treatment:    Reason Eval/Treat Not Completed: Medical issues which prohibited therapy(pt remains on vent, not following commands with recommendation for full comfort care. Will sign off. Should pt status change please reorder)   Sandy Salaam Jude Linck 03/10/2017, 7:19 AM  Elwyn Reach, [place]

## 2017-03-10 NOTE — Progress Notes (Signed)
[facility] Pulmonary Diseases & Critical Care Medicine Initial Pulmonary/Critical Care Consultation  Patient Name: Gregory Galvan MRN: [id number] DOB: October 08, 1940    ADMISSION DATE:  03/13/2017 CONSULTATION DATE:  03/05/2017  REFERRING MD:  Dr. Landis Gandy  REASON FOR CONSULTATION:  hypoxia   HISTORY OF PRESENT ILLNESS  This 77 y.o. Caucasian male is seen in consultation at the request of Dr. Landis Gandy for recommendations on further evaluation and management of acute hypoxic respiratory failure. The patient was admitted after failed outpatient treatment for "pneumonia". He has BL infx & rapidly increased in supplemental oxygen requirement.  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  His mental status deteriorated 1/21, and he developed anisocoria and a CT scan of the head was obtained which showed a frontal lesion.  Subsequently an MRI was performed which shows multiple defects in many vascular territories.  He has required intubation both for airway protection and oxygenation on 1/21.  A BAL was performed on 1/21.  There are still no significant results back from the BAL yet.  He had a cryptococcus antigen earlier in his hospital course which was positive.  He had a HIV titer performed on 10/08/2019 which was negative.  He remains on no sedation and is not at all interactive.   He tolerated 40% oxygen and 5 of PEEP overnight but has a relatively high minute volume of 14 L today.  He is not at all interactive despite not being on any sedative agents.  His significant other's have opted for withdrawal of care later today.  ANTIBIOTICS: Cefepime Clindamycin Diflucan  SIGNIFICANT EVENTS: 1/20 >> transferred to stepdown unit for increasing O2 requirement 1/21 confused -fall overnight - head CT, XR shoulder neg  LINES/TUBES: -   SUBJ Afebrile Remains on high flow O2 COnfused, fall overnight, sedated with ativan   VITAL  SIGNS: BP 121/79   Pulse 87   Temp 98.9 F (37.2 C) (Oral)   Resp 16   Ht _0  (1.753 m)   Wt 174 lb 2.6 oz (79 kg)   SpO2 93%   BMI 25.72 kg/m   HEMODYNAMICS:    VENTILATOR SETTINGS: Vent Mode: PRVC FiO2 (%):  [40 %-50 %] 50 % Set Rate:  [18 bmp] 18 bmp Vt Set:  [550 mL] 550 mL PEEP:  [5 cmH20] 5 cmH20 Pressure Support:  [8 cmH20] 8 cmH20 Plateau Pressure:  [21 cmH20-23 cmH20] 21 cmH20  INTAKE / OUTPUT: I/O last 3 completed shifts: In: 2774 [I.V.:2700; Other:100; NG/GT:1440; IV JOINOMVEH:209] Out: 4709 [Urine:1655]  PHYSICAL EXAMINATION: General: Orally intubated and not at all interactive despite being on no sedation.   Throat/Oral Cavity: Normal dentition. No oral thrush. No exudate. Mucous membranes are moist. No tonsillar enlargement. Neck: supple, no thyromegaly, no JVD, no lymphadenopathy. Trachea midline. Chest/Lung: He is unlabored on PR VC this morning.  Minute ventilation is high at 14+ liters there is symmetric air movement and good air entry.  Lots of scattered rhonchi bilaterally, no wheezes.    Heart: Regular S1 and S2 without murmur, or rub there is an intermittent gallop.  Abdomen: soft, nontender, nondistended. Normoactive bowel sounds. n rebound. No guarding. Extremities: There are raised purpuric lesions on both forearms.   Lymphatic: no cervical/axiallary/inguinal lymph nodes appreciated NEURO: [name] his eye opening today but no response to voice and no tracking.  There is minimal movement of the right upper extremity to noxious stimuli only.  EOMs appear to be intact to doll's eyes  pupils are currently equal.  He has bilateral sub-conjunctival injections.  LABS:  BMET Recent Labs  Lab 03/08/17 0304 03/09/17 0454 03/10/17 0453  NA 137 139 139  K 3.9 3.7 3.9  CL 108 109 108  CO2 20* 22 22  BUN 27* 34* 47*  CREATININE 0.84 0.72 0.71  GLUCOSE 135* 216* 212*    Electrolytes Recent Labs  Lab 03/08/17 0304  03/09/17 0454 03/09/17 1626  03/10/17 0453  CALCIUM 8.0*  --  8.0*  --  8.0*  MG  --    < > 2.3 2.2 2.2  PHOS  --    < > 2.5 2.1* 2.2*   < > = values in this interval not displayed.    CBC Recent Labs  Lab 03/08/17 0304 03/09/17 0454 03/10/17 0453  WBC 13.1* 10.5 11.8*  HGB 10.5* 10.5* 10.3*  HCT 31.1* 30.8* 31.0*  PLT 36* 49* 61*    Coag's Recent Labs  Lab 03/02/2017 1833 03/08/17 0304 03/10/17 0453  INR 1.08 1.56 1.54    Sepsis Markers Recent Labs  Lab 03/02/2017 1854 03/15/2017 2048  03/07/17 1052 03/08/17 0304 03/09/17 0454  LATICACIDVEN 3.39* 1.48  --  1.4  --   --   PROCALCITON  --   --    < > 0.18 0.15 <0.10   < > = values in this interval not displayed.    ABG Recent Labs  Lab 03/06/17 0334 03/06/17 1751 03/09/17 0407  PHART 7.463* 7.371 7.477*  PCO2ART 32.2 39.8 33.0  PO2ART 64.6* 202.0* 76.5*    Liver Enzymes Recent Labs  Lab 03/06/17 1142 03/08/17 0304 03/09/17 0454  AST 61* 61* 60*  ALT 32 31 44  ALKPHOS 125 122 126  BILITOT 0.6 0.8 0.7  ALBUMIN 2.1* 2.0* 1.9*    Cardiac Enzymes No results for input(s): TROPONINI, PROBNP in the last 168 hours.  Glucose Recent Labs  Lab 03/09/17 1225 03/09/17 1559 03/09/17 1941 03/09/17 2322 03/10/17 0324 03/10/17 0822  GLUCAP 208* 234* 191* 186* 215* 182*    Imaging Ct Head Wo Contrast  Result Date: 03/09/2017 CLINICAL DATA:  Altered mental status EXAM: CT HEAD WITHOUT CONTRAST TECHNIQUE: Contiguous axial images were obtained from the base of the skull through the vertex without intravenous contrast. COMPARISON:  CT brain scan of 03/07/2017 FINDINGS: Brain: Low-attenuation foci peripherally bilaterally are again noted consistent with multiple bilateral cerebral infarcts. The bilateral posterior parietal-occipital infarcts are better visualized currently. No hemorrhage is seen. As noted previously these infarcts are largely within the bilateral MCA and PCA distributions. The cerebellar infarcts noted on MR are not well seen  by CT. The septum remains midline in position. There is no change in the minimally prominent ventricles and diffuse cortical sulcal prominence consistent with atrophy. Small vessel ischemic changes again noted throughout the periventricular white matter. Vascular: No vascular abnormality is seen on this unenhanced study. Skull: On bone window images, no calvarial abnormality is noted. Sinuses/Orbits: There is mild mucosal thickening within the posterior sphenoid sinus. No definite sinusitis is seen. Other: None. IMPRESSION: 1. Little change to slightly more prominent bilateral cerebral infarcts. No hemorrhage is noted. 2. No change in atrophy and small vessel ischemic change. Electronically Signed   By: Ivar Drape M.D.   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 Of course, he has a history of Velcade therapy & pulmonary infx could be related to drug toxicity ? Alternatively, pulmonary involvement of amyloidosis should also be considered.  BAL is growing only mixed flora, the fungal culture is not yet reported.  . Interestingly with empiric therapy his gas exchange has improved and he is tolerating 5 of PEEP and 40% this morning.  If not for his altered mental status the patient could be extubated.    Nephrotic syndrome/ Renal amyloid - hold lisinopril in acute setting  Acute encephalopathy -he has multiple defects in multiple vascular territories likely representing embolic phenomenon whether these are blander or infectious is not overt.  Cultures are pending and he is widely covered including coverage with Diflucan for potential cryptococcus.    After extensive discussions with both myself and palliative care,  his partner Suezanne Jacquet, who now holds power of attorney, has opted for withdrawal of care.  His decision is based on the observation that he will have a residual neurological deficit even if he recovers from this event and on the observation that he has severe chronic illness at baseline.  As Mr. Derksen is not capable of making decisions for himself, a substituted decision is appropriate and I will carry out Ben's wishes when he arrives later today.  Lars Masson, MD [place] [facility] Pager [phone] If no response call [phone]      03/10/2017, 9:43 AM

## 2017-03-10 NOTE — Progress Notes (Signed)
OT Cancellation Note and Discharge  Patient Details Name: GRIFFEY NICASIO MRN: [id number] DOB: [birth year]   Cancelled Treatment:    Reason Eval/Treat Not Completed: Patient not medically ready (pt remains on vent, not following commands with recommendation for full comfort care. Will sign off. Should pt status change please reorder)    Almon Register [phone], [phone] 03/10/2017, 7:34 AM

## 2017-03-11 DIAGNOSIS — Z515 Encounter for palliative care: Secondary | ICD-10-CM

## 2017-03-15 ENCOUNTER — Other Ambulatory Visit: Payer: Medicare Other

## 2017-03-15 ENCOUNTER — Ambulatory Visit: Payer: Medicare Other

## 2017-03-15 ENCOUNTER — Telehealth: Payer: Self-pay

## 2017-03-15 NOTE — Telephone Encounter (Signed)
On 03/15/17 I received a d/c from [employer] (original).   The d/c is for cremation. The patient is a patient of Doctor Elsworth Soho. The d/c will be taken to Pulmonary Unit for signature.  On 03/16/17 I received the d/c back from Doctor Elsworth Soho. I got the d/c ready and called the funeral home to let them know the d/c is ready for pickup.  I also faxed a copy to the funeral home per the funeral home request.

## 2017-03-17 NOTE — Progress Notes (Signed)
Spoke with Jeanette Caprice and pt was declined as a Medical Examiner's case.

## 2017-03-17 NOTE — Discharge Summary (Signed)
[facility] Pulmonary Diseases & Critical Care Medicine Initial Pulmonary/Critical Care Consultation  Patient Name: Gregory Galvan MRN: [id number] DOB: [birth year]    ADMISSION DATE:  02/14/2017    COURSE:   77 y.o. Caucasian male is seen in consultation at the request of Dr. Landis Gandy for recommendations on further evaluation and management of acute hypoxic respiratory failure. The patient was admitted after failed outpatient treatment for "pneumonia". He had BL infx & rapidly increased in supplemental oxygen requirement.  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  His mental status deteriorated 1/21, and he developed anisocoria and a CT scan of the head was obtained which showed a frontal lesion.  Subsequently an MRI was performed which shows multiple defects in many vascular territories.  He  required intubation both for airway protection and oxygenation on 1/21.  A BAL was performed on 1/21.  He had a cryptococcus antigen earlier in his hospital course which was positive.  He had a HIV titer performed on 10/08/2019 which was negative.     DD incl HCAP , less likely drug toxicity ? Alternatively, pulmonary involvement of amyloidosis should also be considered.  BAL is growing only mixed flora, the fungal culture is not yet reported.  .   Nephrotic syndrome/ Renal amyloid  Acute encephalopathy -he has multiple defects in multiple vascular territories likely representing embolic phenomenon   Cause of death -acute respiratory failure due to healthcare associated pneumonia, renal amyloidosis  Rakesh V. Elsworth Soho MD   03/14/2017  9:51 AM

## 2017-03-17 NOTE — Progress Notes (Signed)
[facility] Pulmonary Diseases & Critical Care Medicine Initial Pulmonary/Critical Care Consultation  Patient Name: Gregory Galvan MRN: [id number] DOB: September 16, 1940    ADMISSION DATE:  03/02/2017 CONSULTATION DATE:  03/05/2017  REFERRING MD:  Dr. Landis Gandy  REASON FOR CONSULTATION:  hypoxia   HISTORY OF PRESENT ILLNESS  This 77 y.o. Caucasian male is seen in consultation at the request of Dr. Landis Gandy for recommendations on further evaluation and management of acute hypoxic respiratory failure. The patient was admitted after failed outpatient treatment for "pneumonia". He has BL infx & rapidly increased in supplemental oxygen requirement.  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  His mental status deteriorated 1/21, and he developed anisocoria and a CT scan of the head was obtained which showed a frontal lesion.  Subsequently an MRI was performed which shows multiple defects in many vascular territories.  He has required intubation both for airway protection and oxygenation on 1/21.  A BAL was performed on 1/21.  There are still no significant results back from the BAL yet.  He had a cryptococcus antigen earlier in his hospital course which was positive.  He had a HIV titer performed on 10/08/2019 which was negative.  He remains on no sedation and is not at all interactive.   He tolerated 40% oxygen and 5 of PEEP overnight but has a relatively high minute volume of 14 L today.  He is not at all interactive despite not being on any sedative agents.  His significant other's have opted for withdrawal of care later today.  ANTIBIOTICS: Cefepime Clindamycin Diflucan  SIGNIFICANT EVENTS: 1/20 >> transferred to stepdown unit for increasing O2 requirement 1/21 confused -fall overnight - head CT, XR shoulder neg  LINES/TUBES: -   SUBJ Comfortable on morphine.    VITAL SIGNS: BP 98/60 (BP Location: Left Wrist)   Pulse (!)  110   Temp 98.4 F (36.9 C) (Axillary)   Resp (!) 22   Ht _0  (1.753 m)   Wt 85.3 kg (188 lb 0.8 oz)   SpO2 (!) 26%   BMI 27.77 kg/m   HEMODYNAMICS:    VENTILATOR SETTINGS:    INTAKE / OUTPUT: I/O last 3 completed shifts: In: 2126.6 [I.V.:1126.6; Other:100; NG/GT:600; IV Piggyback:300] Out: 1035 [Urine:1035]  PHYSICAL EXAMINATION: Agonal, no increased WOB on morphine   LABS:  BMET Recent Labs  Lab 03/08/17 0304 03/09/17 0454 03/10/17 0453  NA 137 139 139  K 3.9 3.7 3.9  CL 108 109 108  CO2 20* 22 22  BUN 27* 34* 47*  CREATININE 0.84 0.72 0.71  GLUCOSE 135* 216* 212*    Electrolytes Recent Labs  Lab 03/08/17 0304  03/09/17 0454 03/09/17 1626 03/10/17 0453  CALCIUM 8.0*  --  8.0*  --  8.0*  MG  --    < > 2.3 2.2 2.2  PHOS  --    < > 2.5 2.1* 2.2*   < > = values in this interval not displayed.    CBC Recent Labs  Lab 03/08/17 0304 03/09/17 0454 03/10/17 0453  WBC 13.1* 10.5 11.8*  HGB 10.5* 10.5* 10.3*  HCT 31.1* 30.8* 31.0*  PLT 36* 49* 61*    Coag's Recent Labs  Lab 03/08/17 0304 03/10/17 0453  INR 1.56 1.54    Sepsis Markers Recent Labs  Lab 03/07/17 1052 03/08/17 0304 03/09/17 0454  LATICACIDVEN 1.4  --   --   PROCALCITON 0.18 0.15 <0.10    ABG Recent  Labs  Lab 03/06/17 0334 03/06/17 1751 03/09/17 0407  PHART 7.463* 7.371 7.477*  PCO2ART 32.2 39.8 33.0  PO2ART 64.6* 202.0* 76.5*    Liver Enzymes Recent Labs  Lab 03/06/17 1142 03/08/17 0304 03/09/17 0454  AST 61* 61* 60*  ALT 32 31 44  ALKPHOS 125 122 126  BILITOT 0.6 0.8 0.7  ALBUMIN 2.1* 2.0* 1.9*    Cardiac Enzymes No results for input(s): TROPONINI, PROBNP in the last 168 hours.  Glucose Recent Labs  Lab 03/09/17 1225 03/09/17 1559 03/09/17 1941 03/09/17 2322 03/10/17 0324 03/10/17 0822  GLUCAP 208* 234* 191* 186* 215* 182*    Imaging No results found.     ASSESSMENT / PLAN: Active Problems:   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)   Sepsis due to  pneumonia ([place])   HCAP (healthcare-associated pneumonia)   Immunocompromised state ([place])   Acute hypoxemic respiratory failure ([place])   Acute encephalopathy  DD incl HCAP , less likely  PJP or other opportunistic infections. Of course, he has a history of Velcade therapy & pulmonary infx could be related to drug toxicity ? Alternatively, pulmonary involvement of amyloidosis should also be considered.  BAL is growing only mixed flora, the fungal culture is not yet reported.  .   Nephrotic syndrome/ Renal amyloid  Acute encephalopathy -he has multiple defects in multiple vascular territories likely representing embolic phenomenon whether these are blander or infectious is not overt.  Cultures are pending    Now comfort care s/p withdrawal 1/25. Comfortable on morphine gtt.  Discussed at length with family at bedside 1/26. Will ask SW to get Gone from my sight booklet.  Discussed with RN.  Anticipate death in hours.   Nickolas Madrid, NP 20-Mar-2017  12:39 PM Pager: [phone] or [phone]

## 2017-03-17 NOTE — Progress Notes (Addendum)
1627 Patient passed away with partner Suezanne Jacquet) at bedside. Pronounced by Dawna Part, RN and Shanon Ace, RN. Dr. Elsworth Soho called and notified. [place] notified- patient not a candidate.   1657 Wasted 124ml Morphine drip. Witnessed by Hulan Amato, RN.

## 2017-03-17 DEATH — deceased

## 2017-03-22 ENCOUNTER — Other Ambulatory Visit: Payer: Medicare Other

## 2017-03-22 ENCOUNTER — Ambulatory Visit: Payer: Medicare Other

## 2017-03-29 ENCOUNTER — Ambulatory Visit: Payer: Medicare Other

## 2017-03-29 ENCOUNTER — Other Ambulatory Visit: Payer: Medicare Other

## 2017-03-29 LAB — FUNGUS CULTURE RESULT

## 2017-03-29 LAB — FUNGUS CULTURE WITH STAIN

## 2017-03-29 LAB — FUNGAL ORGANISM REFLEX

## 2017-04-27 ENCOUNTER — Other Ambulatory Visit: Payer: Self-pay | Admitting: Nurse Practitioner

## 2017-06-06 ENCOUNTER — Ambulatory Visit: Payer: Self-pay | Admitting: Internal Medicine

## 2017-12-21 ENCOUNTER — Encounter: Payer: Self-pay | Admitting: Internal Medicine

## 2019-03-05 IMAGING — CR DG BONE SURVEY MET
8 of 10 series · 8 of 10 positions shown · non-contrast
Comparison: None.

CLINICAL DATA: Nephrotic syndrome.  Monoclonal paraproteinemia

EXAM:
METASTATIC BONE SURVEY

[w chest pa]
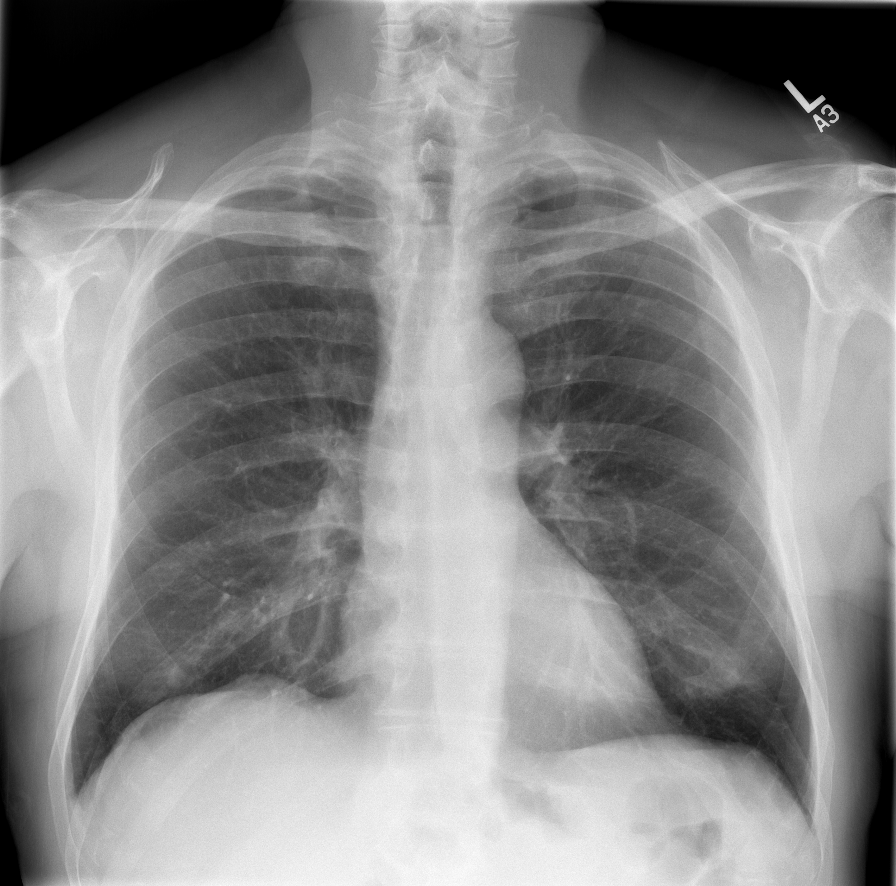

[w c-spine lat]
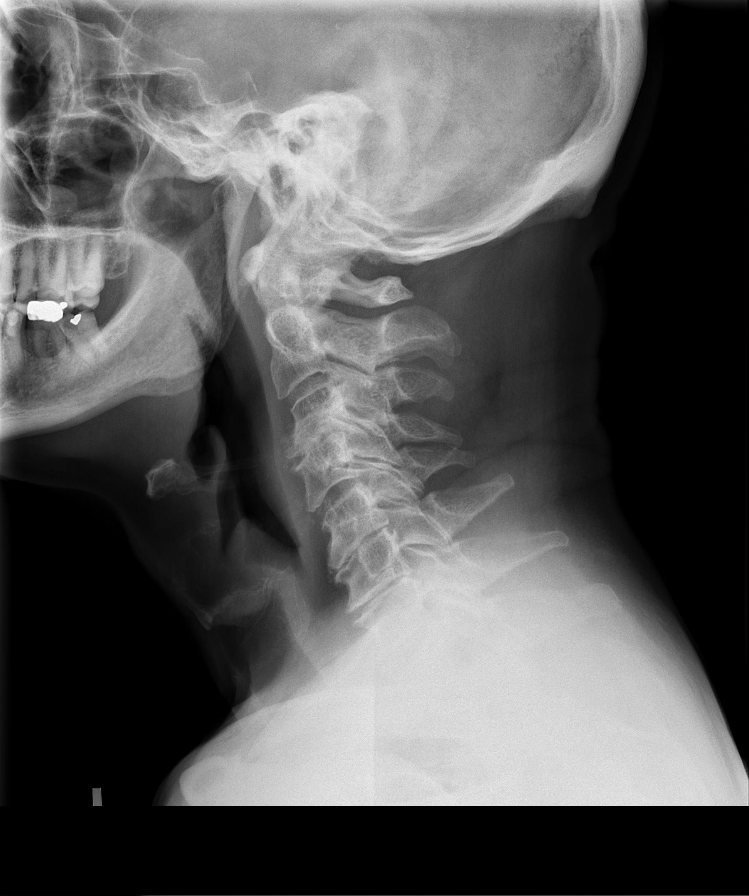

[w c-spine a.p. *]
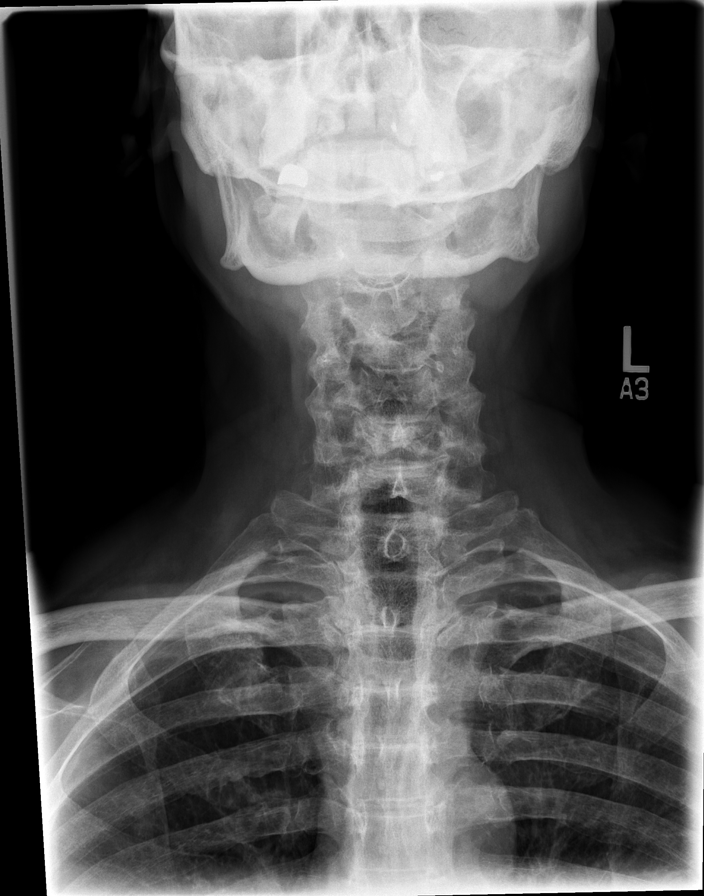

[w skull lat]
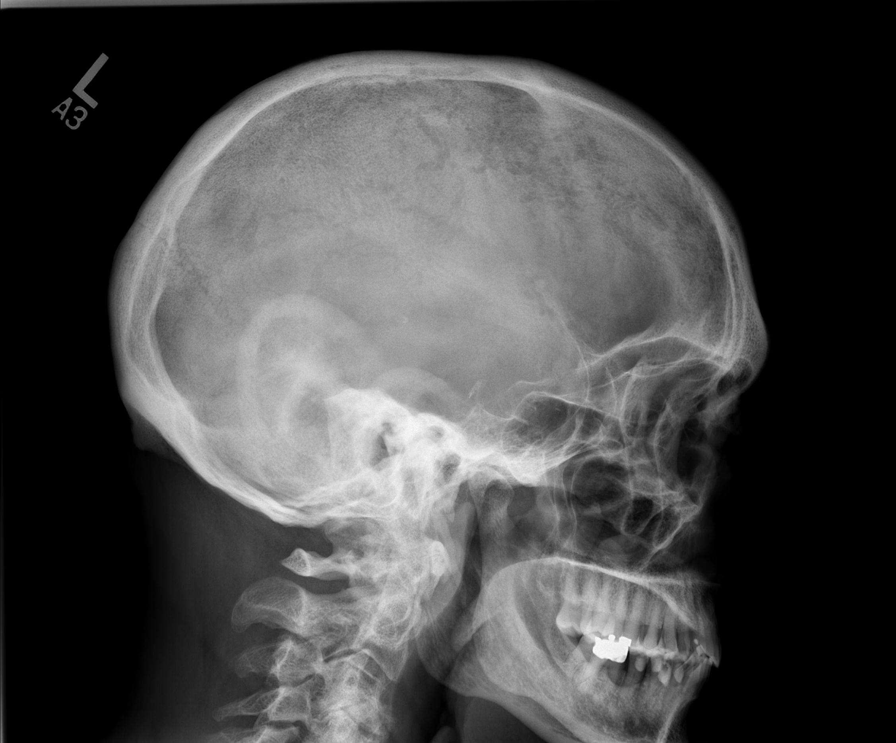

[w shoulder ap external righ]
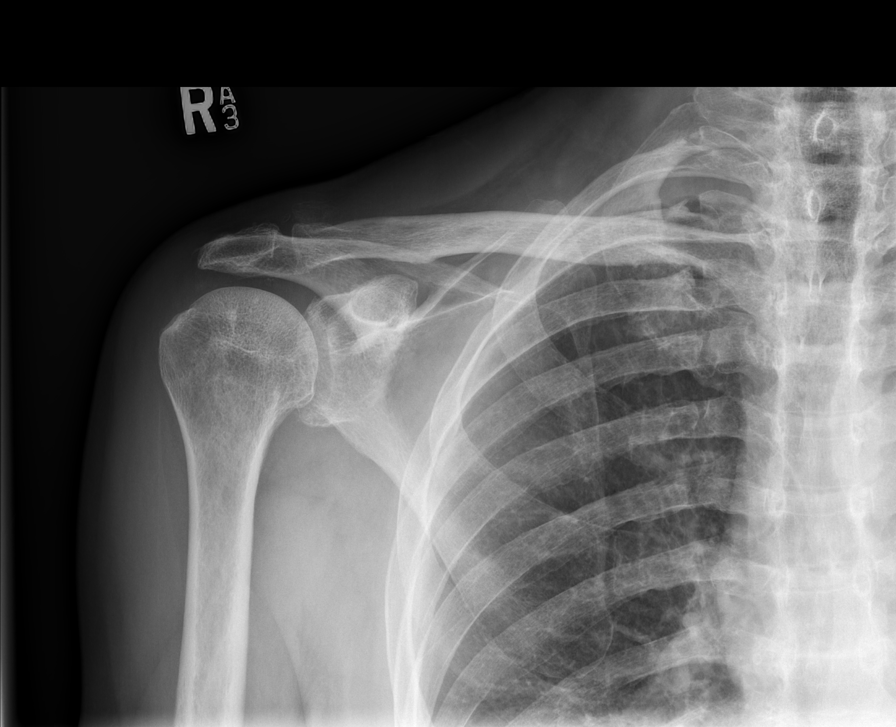

[w humerus ap right *]
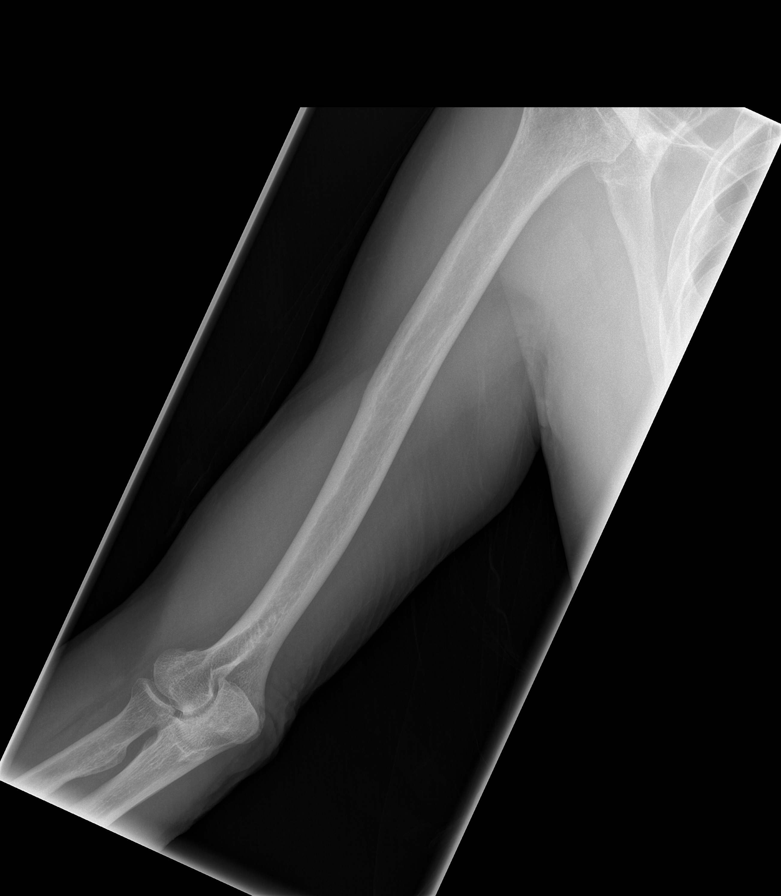

[w shoulder ap external left]
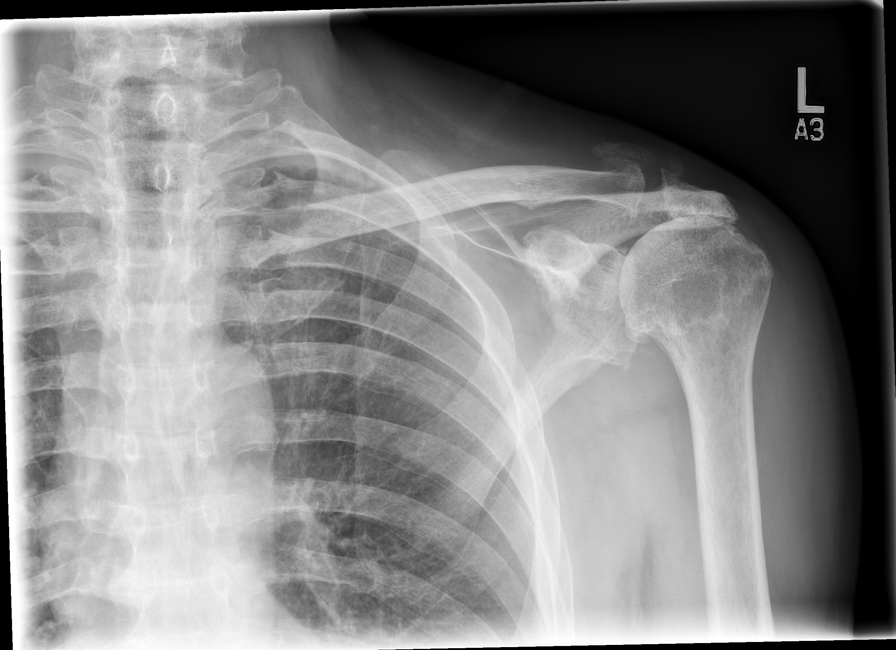

[w humerus ap left *]
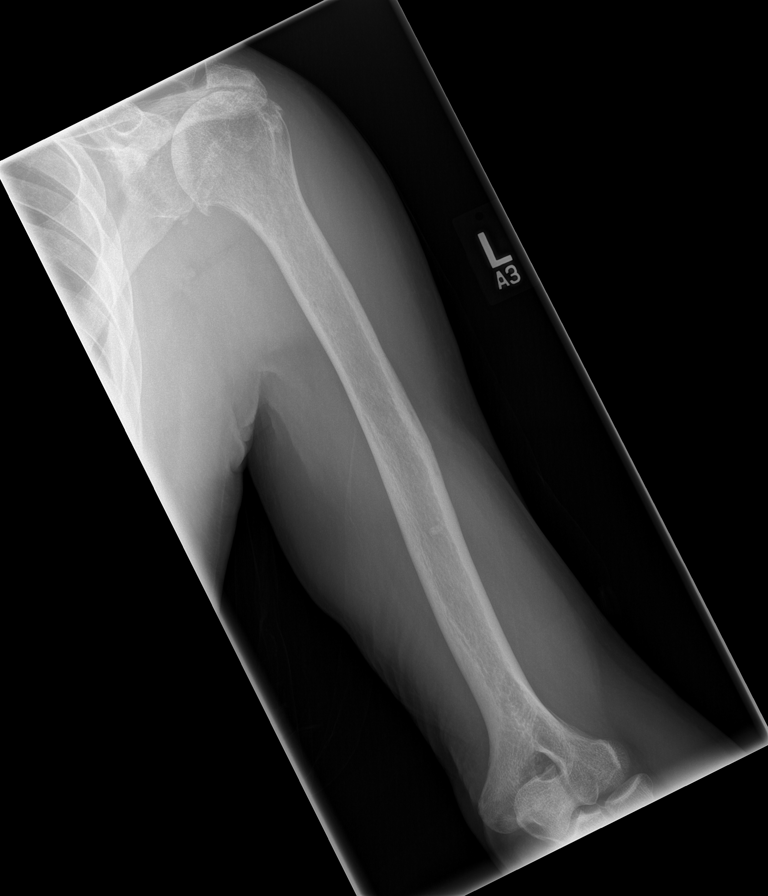

[8 of 10 positions shown; findings below may reference images not displayed]

FINDINGS: Multiple images of the axial and appendicular skeleton are provided.

No aggressive lytic or sclerotic osseous lesion.

Degenerative disc disease with disc height loss at C3-4, C5-6 and
C6-7. Mild dextrocurvature of the thoracolumbar spine. Degenerative
disc disease with severe disc height loss at L1-2, L4-5 and L5-S1
with bilateral facet arthropathy at L4-5 and L5-S1. Mild
osteoarthritis of the right glenohumeral joint. Moderate
osteoarthritis of the left glenohumeral joint. Moderate arthropathy
of the left acromioclavicular joint.

Lungs are clear. No focal consolidation, pleural effusion or
pneumothorax. Stable cardiomediastinal silhouette.

No acute fracture or dislocation. Chondrocalcinosis of the medial
and lateral femorotibial compartment of the right knee as can be
seen with CPPD. Prior resection of the trapezoid bilaterally.
IMPRESSION: No aggressive lytic or sclerotic osseous lesion of the axial and
appendicular skeleton.

## 2019-03-19 IMAGING — CT CT BIOPSY AND ASPIRATION BONE MARROW
1 of 2 series · 15 of 29 positions shown, 19 images · non-contrast
Comparison: none

INDICATION: History of renal amyloidosis with concern for myeloma. Please
perform CT-guided bone marrow biopsy for tissue diagnostic purposes.

[Series 2: i-spiral 5.0 b40f · axial · 0.89mm/px · z∈[+1072,+1163]mm · 15 of 30 slices shown, 19 images]
[im 2/30  mediastinal]
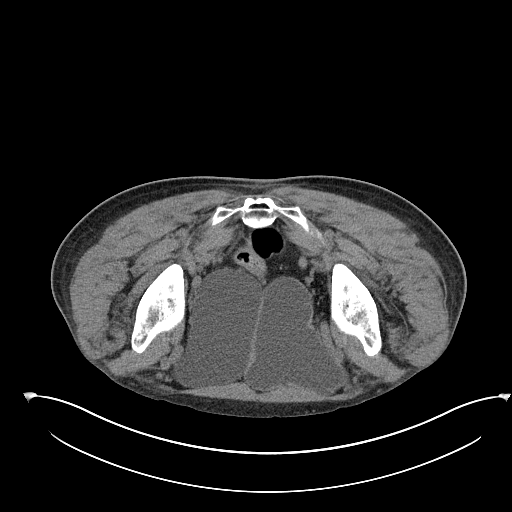
[im 2/30  lung]
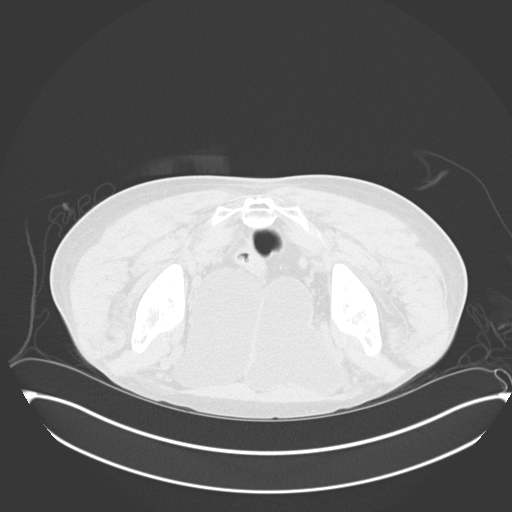
[im 4/30  lung]
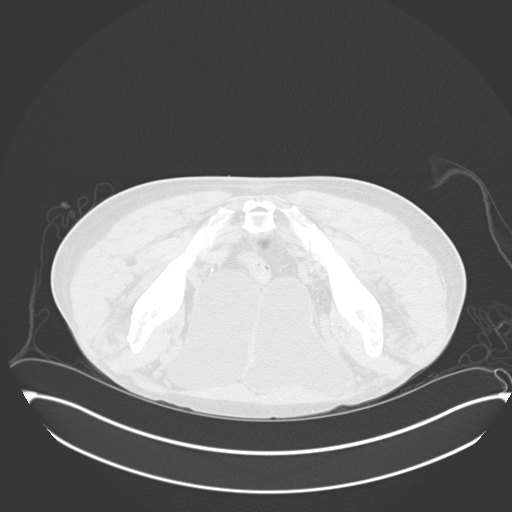
[im 6/30  lung]
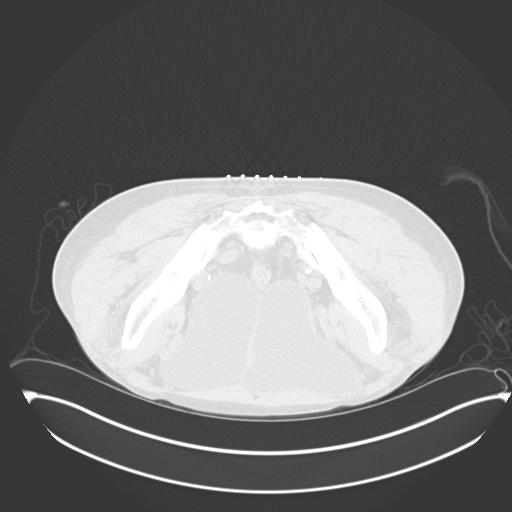
[im 8/30  lung]
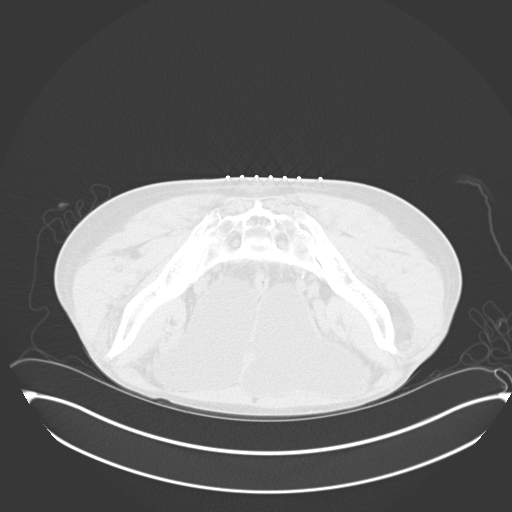
[im 10/30  mediastinal]
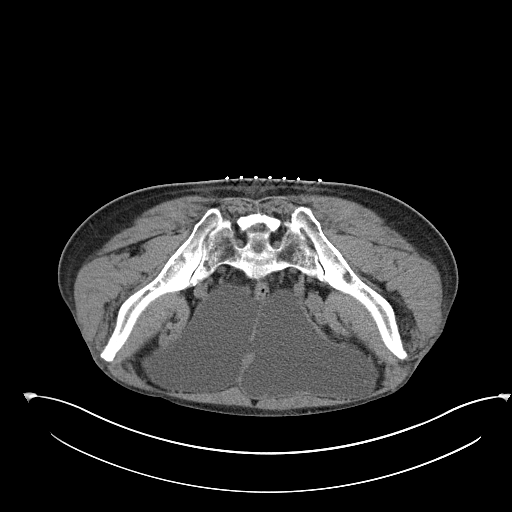
[im 10/30  lung]
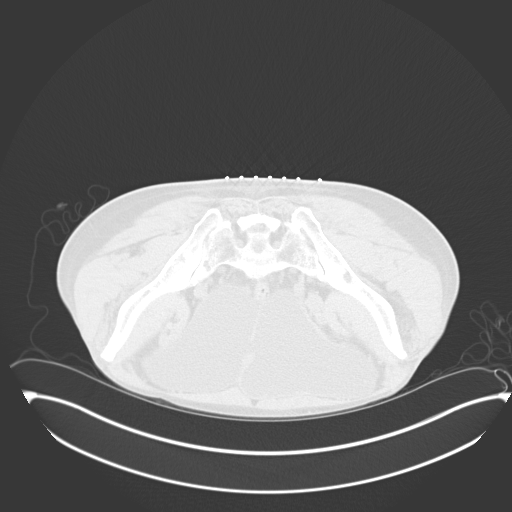
[im 12/30  lung]
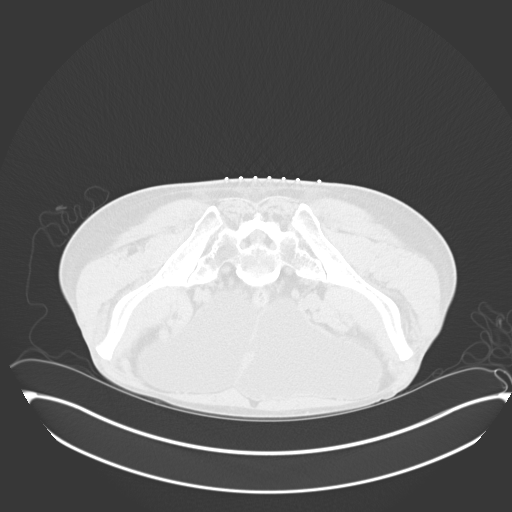
[im 13/30  lung]
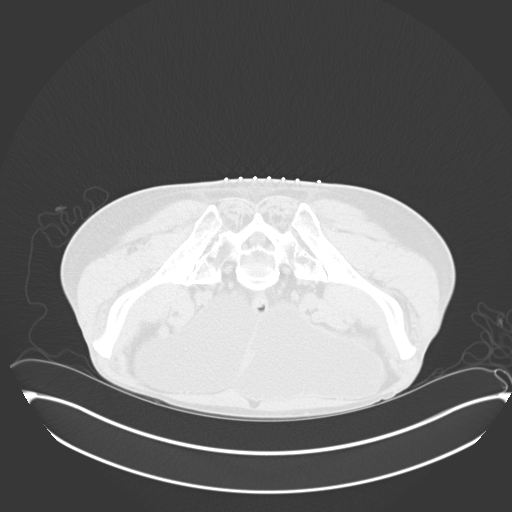
[im 15/30  lung]
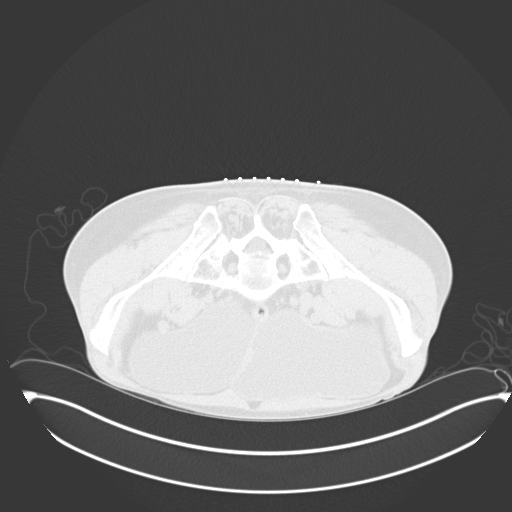
[im 16/30  mediastinal]
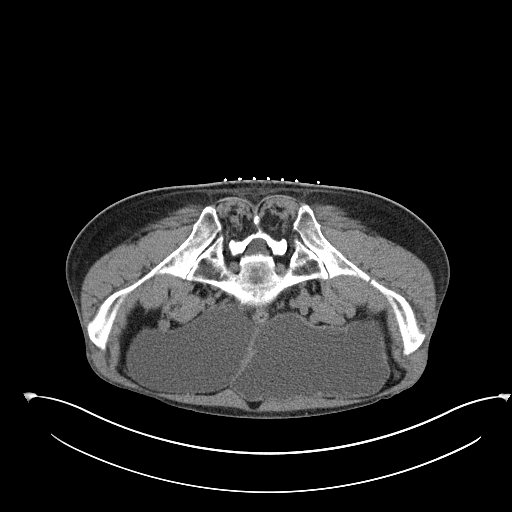
[im 16/30  lung]
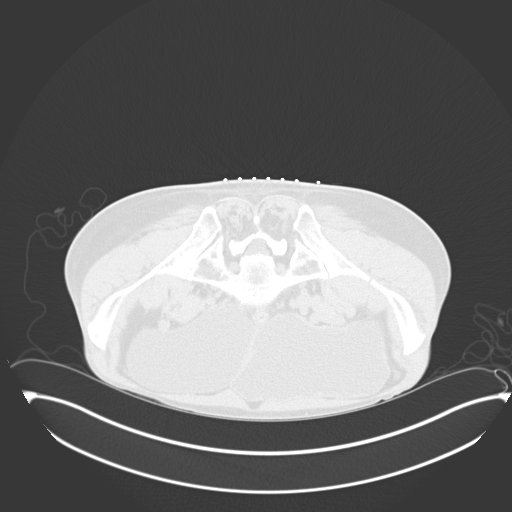
[im 18/30  lung]
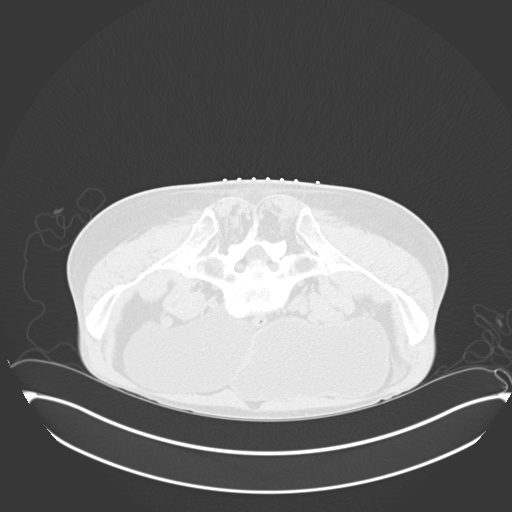
[im 20/30  lung]
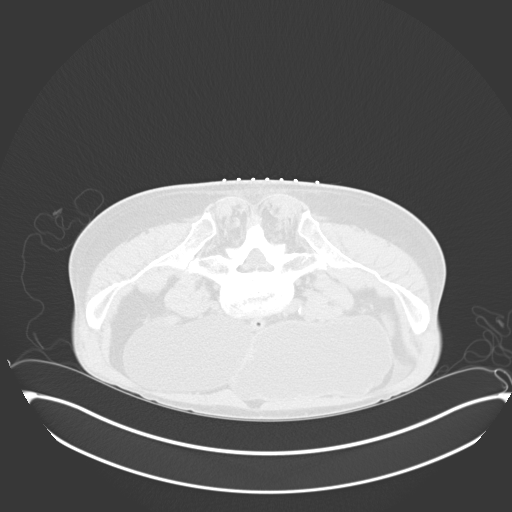
[im 22/30  lung]
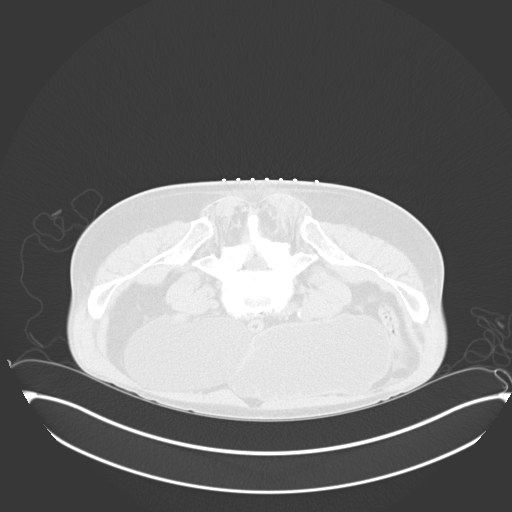
[im 24/30  mediastinal]
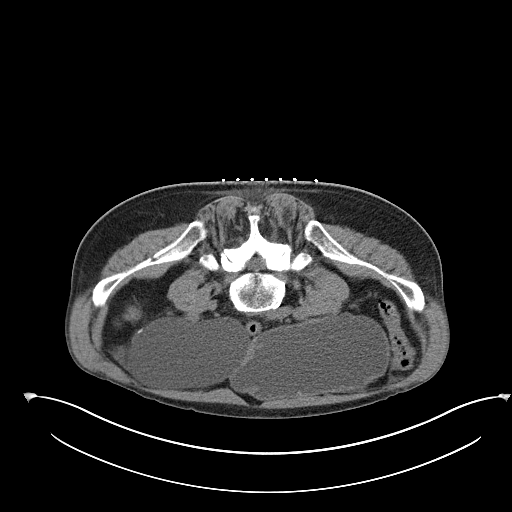
[im 24/30  lung]
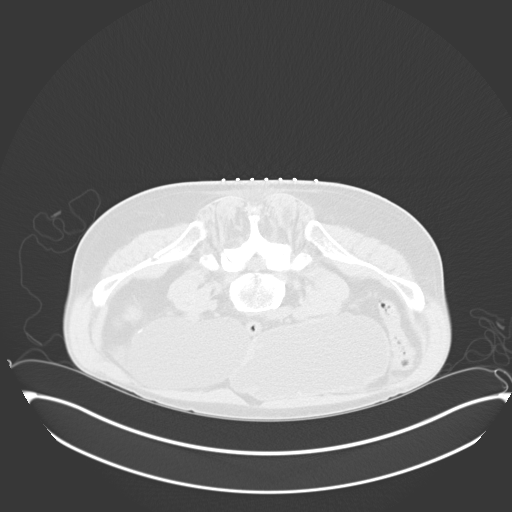
[im 26/30  lung]
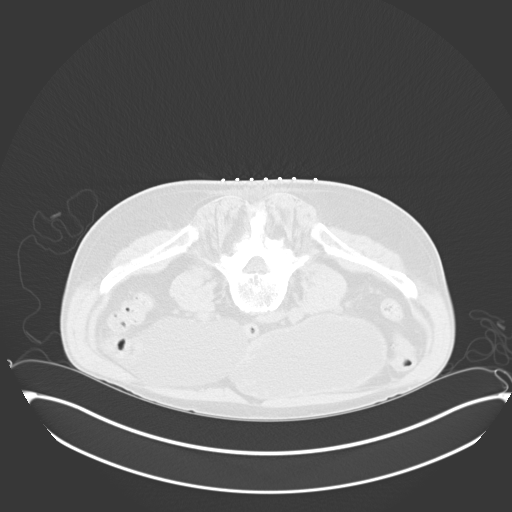
[im 28/30  lung]
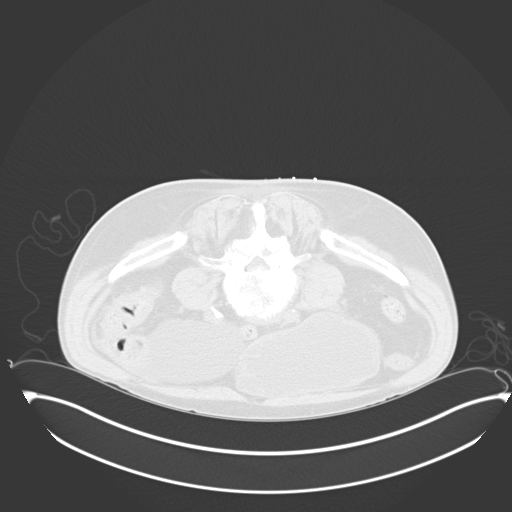

[15 of 29 positions shown; findings below may reference images not displayed]

EXAM:
CT-GUIDED BONE MARROW BIOPSY AND ASPIRATION

MEDICATIONS:
None

ANESTHESIA/SEDATION:
Fentanyl 100 mcg IV; Versed 2 mg IV

Sedation Time: 10 minutes; The patient was continuously monitored
during the procedure by the interventional radiology nurse under my
direct supervision.

COMPLICATIONS:
None immediate.

PROCEDURE:
Informed consent was obtained from the patient following an
explanation of the procedure, risks, benefits and alternatives. The
patient understands, agrees and consents for the procedure. All
questions were addressed. A time out was performed prior to the
initiation of the procedure. The patient was positioned prone and
non-contrast localization CT was performed of the pelvis to
demonstrate the iliac marrow spaces. The operative site was prepped
and draped in the usual sterile fashion.

Under sterile conditions and local anesthesia, a 22 gauge spinal
needle was utilized for procedural planning. Next, an 11 gauge
coaxial bone biopsy needle was advanced into the left iliac marrow
space. Needle position was confirmed with CT imaging. Initially,
bone marrow aspiration was performed. Next, a bone marrow biopsy was
obtained with the 11 gauge outer bone marrow device. Samples were
prepared with the cytotechnologist and deemed adequate. The needle
was removed intact. Hemostasis was obtained with compression and a
dressing was placed. The patient tolerated the procedure well
without immediate post procedural complication.
IMPRESSION: Successful CT guided left iliac bone marrow aspiration and core
biopsy.
